# Patient Record
Sex: Male | Born: 1955 | Race: White | Hispanic: No | Marital: Single | Smoking: Never smoker
Health system: Southern US, Community
[De-identification: ages and names within clinical notes are randomized; demographics above are authoritative.]

## PROBLEM LIST (undated history)

## (undated) DIAGNOSIS — M545 Low back pain, unspecified: Secondary | ICD-10-CM

## (undated) DIAGNOSIS — E785 Hyperlipidemia, unspecified: Secondary | ICD-10-CM

## (undated) DIAGNOSIS — K219 Gastro-esophageal reflux disease without esophagitis: Secondary | ICD-10-CM

## (undated) DIAGNOSIS — I10 Essential (primary) hypertension: Secondary | ICD-10-CM

## (undated) DIAGNOSIS — D649 Anemia, unspecified: Secondary | ICD-10-CM

## (undated) DIAGNOSIS — C801 Malignant (primary) neoplasm, unspecified: Secondary | ICD-10-CM

## (undated) HISTORY — DX: Gastro-esophageal reflux disease without esophagitis: K21.9

## (undated) HISTORY — DX: Low back pain, unspecified: M54.50

## (undated) HISTORY — DX: Low back pain: M54.5

## (undated) HISTORY — PX: BACK SURGERY: SHX140

## (undated) HISTORY — DX: Hyperlipidemia, unspecified: E78.5

---

## 2007-12-01 ENCOUNTER — Ambulatory Visit (HOSPITAL_COMMUNITY): Admission: RE | Admit: 2007-12-01 | Discharge: 2007-12-02 | Payer: Self-pay | Admitting: Neurosurgery

## 2008-01-18 ENCOUNTER — Emergency Department (HOSPITAL_COMMUNITY): Admission: EM | Admit: 2008-01-18 | Discharge: 2008-01-19 | Payer: Self-pay | Admitting: Emergency Medicine

## 2009-05-23 ENCOUNTER — Emergency Department (HOSPITAL_COMMUNITY): Admission: EM | Admit: 2009-05-23 | Discharge: 2009-05-23 | Payer: Self-pay | Admitting: Emergency Medicine

## 2010-10-23 NOTE — Op Note (Signed)
Jack Gibbs, Jack Gibbs            ACCOUNT NO.:  [id number]   MEDICAL RECORD NO.:  [id number]          PATIENT TYPE:  OIB   LOCATION:  3537                         FACILITY:  [facility]   PHYSICIAN:  Danae Orleans. Venetia Maxon, M.D.  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   PROCEDURE:  Right L5-S1 microdiskectomy with microdissection.   SURGEON:  Danae Orleans. Venetia Maxon, MD.   ASSISTANT:  Coletta Memos, MD.   ANESTHESIA:  General endotracheal anesthesia.   ESTIMATED BLOOD LOSS:  Minimal.   COMPLICATIONS:  None.   DISPOSITION:  Recovery.   INDICATIONS:  Jack Gibbs is a 55 year old man with a severe right  leg pain and weakness in the L5-S1 distribution with a free fragment  herniated disk material which was directly compressing the S1 nerve root  fairly low in the foramen.  It was elected to take him for surgery for  microdiskectomy.   PROCEDURE:  Jack Gibbs was brought to the operating room.  Following  satisfactory and uncomplicated induction of general endotracheal  anesthesia plus intravenous lines, the patient was placed in a prone  position on the Wilson frame.  His low back was prepped and draped in  usual sterile fashion.  An incision was infiltrated with 0.25% Marcaine  and 0.5% lidocaine with 1:100,000 epinephrine.  Incision was made and  carried through to the lumbodorsal fascia.  It was incised sharply on  the right side of midline.  Subperiosteal dissection was performed  exposing the L5-S1 interspace.  Marker probes as well as plate was felt  to be S1-S2 and also at L5-S1 and the orientation was in fact confirmed.  Subsequently,  a hemisemilaminectomy of L5 was performed with high-speed  drill and completed with Kerrison rongeurs.  A generous foraminotomy  overlying the S1 nerve root was also performed.  The ligamentum flavum  was then detached and removed in piecemeal fashion.  Microscope was  brought into field.  The lateral thecal sac was decompressed as was the  lateral recess.  The S1 nerve root was mobilized.  Just beneath the S1  nerve root, there was a thinly contained fragment of herniated disk  material.  This was removed resulting in significant decompression of  the S1 nerve root.  Both myself and Dr. Franky Macho then palpated the floor  of the canal.  Small additional residual fragments of disk material were  removed.  The nerve root appeared to be well decompressed.  There did  appear to be bulging of the annulus, but it was not felt that this was  sufficiently problematic warranted removal and the interspace itself was  not opened.  The floor of the canal was again palpated as was the course  of the S1 nerve root with a variety of lengths of ball-tip probes and  there did not appear  to be any residual compressive disk material.  Hemostasis assured with Gelfoam-soaked thrombin, and after doing so, the  wound was bathed in Depo-Medrol and fentanyl.  Microscope was brought  out of the field and lumbodorsal fascia was closed with Vicryl sutures,  subcutaneous tissues were approximated zero Vicryl interrupted inverted  sutures and skin edges were approximated interrupted 3-0 Vicryl  subcuticular stitch.  The wound was dressed and Dermabond.  The patient  was extubated in the operating room and taken to the recovery room in  stable and satisfactory condition.  He tolerated the operation well.  Counts correct at the end of the case.      Danae Orleans. Venetia Maxon, M.D.  Electronically Signed     JDS/MEDQ  D:  12/01/2007  T:  12/02/2007  Job:  [id number]

## 2011-03-07 LAB — CBC
HCT: 43.7
Hemoglobin: 15.5
MCHC: 35.5
RBC: 5.01

## 2014-08-07 ENCOUNTER — Encounter (HOSPITAL_COMMUNITY): Payer: Self-pay | Admitting: Emergency Medicine

## 2014-08-07 ENCOUNTER — Emergency Department (HOSPITAL_COMMUNITY)
Admission: EM | Admit: 2014-08-07 | Discharge: 2014-08-07 | Disposition: A | Discharge status: Home / Self Care | Payer: Medicare Other | Attending: Emergency Medicine | Admitting: Emergency Medicine

## 2014-08-07 DIAGNOSIS — L309 Dermatitis, unspecified: Secondary | ICD-10-CM | POA: Diagnosis not present

## 2014-08-07 DIAGNOSIS — R21 Rash and other nonspecific skin eruption: Secondary | ICD-10-CM | POA: Diagnosis present

## 2014-08-07 MED ORDER — HYDROXYZINE HCL 25 MG PO TABS: 25.0000 mg | ORAL_TABLET | Freq: Four times a day (QID) | ORAL | Status: DC

## 2014-08-07 MED ORDER — DEXAMETHASONE SODIUM PHOSPHATE 10 MG/ML IJ SOLN
INTRAMUSCULAR | Status: AC
  Administered 2014-08-07: 10 mg via INTRAMUSCULAR
  Filled 2014-08-07: qty 1

## 2014-08-07 MED ORDER — DEXAMETHASONE SODIUM PHOSPHATE 4 MG/ML IJ SOLN: 10.0000 mg | Freq: Once | INTRAMUSCULAR | Status: AC

## 2014-08-07 MED ORDER — PREDNISONE 50 MG PO TABS: 50.0000 mg | ORAL_TABLET | Freq: Every day | ORAL | Status: DC

## 2014-08-07 NOTE — ED Provider Notes (Signed)
CSN: [id number]     Arrival date & time 08/07/14  1227 History  This chart was scribed for non-physician practitioner, [name], PA-C, working with [name], MD, by [name], [name]. This patient was seen in room APFT21/APFT21 and the patient's care was started at 1:57 PM.     Chief Complaint  Patient presents with  . Allergic Reaction     Patient is a 59 y.o. male presenting with allergic reaction. The history is provided by the patient. No language interpreter was used.  Allergic Reaction Presenting symptoms: rash   Presenting symptoms: no difficulty swallowing and no wheezing    HPI Comments: Jack Gibbs is a 59 y.o. male who presents to the Emergency Department complaining of burning rash to bilateral anterior forearms and redness to face. Pt states he was working in his basement at onset and is concerned he inhaled a chemical or mold. Pt notes similar reactions during moist and humid weather when inside upper level of home. Pt notes associated itching and a "coated tongue."  Pt was seen by Dr. Everette Rank for similar symptoms on 08/05/14. Pt was given a Rx for 4mg  methylprednisolone with no relief. Pt has also been taking Benedryl. Pt is not a smoker and does not chew tobacco. Pt denies facial swelling, difficulty swallowing or breathing or chest pain.  History reviewed. No pertinent past medical history. Past Surgical History  Procedure Laterality Date  . Back surgery     Family History  Problem Relation Age of Onset  . Stroke Mother    History  Substance Use Topics  . Smoking status: Never Smoker   . Smokeless tobacco: Former Systems developer  . Alcohol Use: No    Review of Systems  Constitutional: Negative for fever, chills, activity change and appetite change.  HENT: Negative for ear pain, facial swelling, sore throat and trouble swallowing.   Respiratory: Negative for chest tightness, shortness of breath and wheezing.   Gastrointestinal: Negative for  nausea and vomiting.  Musculoskeletal: Negative for neck pain and neck stiffness.  Skin: Positive for rash. Negative for wound.  Neurological: Negative for dizziness, weakness, numbness and headaches.  All other systems reviewed and are negative.     Allergies  Review of patient's allergies indicates no known allergies.  Home Medications   Prior to Admission medications   Not on File   BP 151/89 mmHg  Pulse 72  Temp(Src) 97.8 F (36.6 C) (Axillary)  Resp 20  Ht 5\' 11"  (1.803 m)  Wt 150 lb (68.04 kg)  BMI 20.93 kg/m2  SpO2 100% Physical Exam  Constitutional: He is oriented to person, place, and time. He appears well-developed and well-nourished. No distress.  HENT:  Head: Normocephalic.  Pt has slight brownish discoloration to the tongue, no lesions.  Eyes: Conjunctivae are normal.  Neck: Normal range of motion. Neck supple.  Cardiovascular: Normal rate, regular rhythm, normal heart sounds and intact distal pulses.   No murmur heard. Pulmonary/Chest: Effort normal. No respiratory distress. He exhibits no tenderness.  Musculoskeletal: Normal range of motion. He exhibits no edema or tenderness.  Lymphadenopathy:    He has no cervical adenopathy.  Neurological: He is alert and oriented to person, place, and time. Coordination normal.  Skin: Skin is warm and dry. Rash noted. Rash is maculopapular. There is erythema (mild).  confluent maculopapular rash to both forearms; no edema; mild erythema present.   Psychiatric: He has a normal mood and affect. His behavior is normal.  Nursing note and  vitals reviewed.   ED Course  Procedures (including critical care time) DIAGNOSTIC STUDIES: Oxygen Saturation is 100% on room air, normal by my interpretation.    COORDINATION OF CARE: 2:08 PM Discussed treatment plan with patient at beside, the patient agrees with the plan and has no further questions at this time.   Labs Review Labs Reviewed - No data to display  Imaging  Review No results found.   EKG Interpretation None      MDM   Final diagnoses:  Dermatitis   Pt is well appearing, non-toxic.  Vitals stable, no concerning sx's for angioedema or anaphylaxis. Will change pt to prednisone and vistaril.  He agrees to close f/u with his PMD or with dermatology.   I personally performed the services described in this documentation, which was scribed in my presence. The recorded information has been reviewed and is accurate.     [name] L. [name], PA-C 08/09/14 2224  [name], MD 08/11/14 [phone]

## 2014-08-07 NOTE — ED Notes (Signed)
Pt reports allergic reaction. States he feels "shakey," has coated tongue. Pt seen by Dr. Everette Rank on 2/26

## 2014-08-07 NOTE — Discharge Instructions (Signed)
Rash A rash is a change in the color or feel of your skin. There are many different types of rashes. You may have other problems along with your rash. HOME CARE  Avoid the thing that caused your rash.  Do not scratch your rash.  You may take cools baths to help stop itching.  Only take medicines as told by your doctor.  Keep all doctor visits as told. GET HELP RIGHT AWAY IF:   Your pain, puffiness (swelling), or redness gets worse.  You have a fever.  You have new or severe problems.  You have body aches, watery poop (diarrhea), or you throw up (vomit).  Your rash is not better after 3 days. MAKE SURE YOU:   Understand these instructions.  Will watch your condition.  Will get help right away if you are not doing well or get worse. Document Released: 11/13/2007 Document Revised: 08/19/2011 Document Reviewed: 03/11/2011 Exit[employer] Patient Information 2015 [employer]. This information is not intended to replace advice given to you by your health care provider. Make sure you discuss any questions you have with your health care provider.  

## 2014-08-10 ENCOUNTER — Encounter (HOSPITAL_COMMUNITY): Payer: Self-pay | Admitting: Emergency Medicine

## 2014-08-10 ENCOUNTER — Emergency Department (HOSPITAL_COMMUNITY)
Admission: EM | Admit: 2014-08-10 | Discharge: 2014-08-10 | Disposition: A | Discharge status: Home / Self Care | Payer: Medicare Other | Attending: Emergency Medicine | Admitting: Emergency Medicine

## 2014-08-10 DIAGNOSIS — L539 Erythematous condition, unspecified: Secondary | ICD-10-CM | POA: Insufficient documentation

## 2014-08-10 DIAGNOSIS — R21 Rash and other nonspecific skin eruption: Secondary | ICD-10-CM | POA: Diagnosis present

## 2014-08-10 DIAGNOSIS — Z79899 Other long term (current) drug therapy: Secondary | ICD-10-CM | POA: Diagnosis not present

## 2014-08-10 DIAGNOSIS — Z7952 Long term (current) use of systemic steroids: Secondary | ICD-10-CM | POA: Diagnosis not present

## 2014-08-10 NOTE — ED Notes (Signed)
Placed patient in hospital gown. Patient's family member outside of room. Patient stated "I don't want them back in here, they can wait out there." Family sent to waiting room.

## 2014-08-10 NOTE — ED Notes (Signed)
Patient's family came to nursing station and requested to see patient. Family member stated "I'm going in there whether he likes it or not." Patient states he does not want family in room. Advised family they would have to wait out in the waiting room.

## 2014-08-10 NOTE — ED Provider Notes (Signed)
CSN: [id number]     Arrival date & time 08/10/14  1102 History   First MD Initiated Contact with Patient 08/10/14 1225     Chief Complaint  Patient presents with  . Rash     (Consider location/radiation/quality/duration/timing/severity/associated sxs/prior Treatment) HPI Comments: Seen by PCP 2 weeks ago for the same rash. Seen in ED 2/28 for same rash.  Pt concerned about inhaling a chemical because of paint in the back yard.  Patient is a 59 y.o. male presenting with rash. The history is provided by the patient.  Rash Location:  Face, shoulder/arm and ano-genital Facial rash location:  Face Shoulder/arm rash location:  L arm and R arm Ano-genital rash location:  Groin Quality: burning, itchiness and redness   Quality: not blistering, not draining, not painful, not peeling, not swelling and not weeping   Severity:  Moderate Onset quality:  Gradual Duration:  2 weeks Timing:  Intermittent Progression:  Worsening Chronicity:  New Context: not food, not new detergent/soap, not nuts and not sick contacts   Context comment:  Possible mold exposure. Possible chemical exposure.  Relieved by:  Nothing Ineffective treatments: oral steroids. Associated symptoms: no abdominal pain, no joint pain, no shortness of breath and not wheezing     History reviewed. No pertinent past medical history. Past Surgical History  Procedure Laterality Date  . Back surgery     Family History  Problem Relation Age of Onset  . Stroke Mother    History  Substance Use Topics  . Smoking status: Never Smoker   . Smokeless tobacco: Former Systems developer  . Alcohol Use: No    Review of Systems  Constitutional: Negative for activity change.       All ROS Neg except as noted in HPI  HENT: Negative for nosebleeds.   Eyes: Negative for photophobia and discharge.  Respiratory: Negative for cough, shortness of breath and wheezing.   Cardiovascular: Negative for chest pain and palpitations.  Gastrointestinal:  Negative for abdominal pain and blood in stool.  Genitourinary: Negative for dysuria, frequency and hematuria.  Musculoskeletal: Negative for back pain, arthralgias and neck pain.  Skin: Positive for rash.  Neurological: Negative for dizziness, seizures and speech difficulty.  Psychiatric/Behavioral: Negative for hallucinations and confusion.      Allergies  Review of patient's allergies indicates no known allergies.  Home Medications   Prior to Admission medications   Medication Sig Start Date End Date Taking? Authorizing Provider  diphenhydrAMINE (BENADRYL) 25 MG tablet Take 25 mg by mouth every 6 (six) hours as needed for itching.   Yes Historical Provider, MD  hydrOXYzine (ATARAX/VISTARIL) 25 MG tablet Take 1 tablet (25 mg total) by mouth every 6 (six) hours. 08/07/14  Yes Tammy L. Triplett, PA-C  methylPREDNIsolone (MEDROL DOSPACK) 4 MG tablet Take 1 tablet by mouth daily. 08/05/14  Yes Historical Provider, MD  predniSONE (DELTASONE) 50 MG tablet Take 1 tablet (50 mg total) by mouth daily. For 5 days 08/07/14  Yes Tammy L. Triplett, PA-C   BP 169/86 mmHg  Pulse 71  Temp(Src) 97.7 F (36.5 C) (Oral)  Resp 18  Ht 5\' 10"  (1.778 m)  Wt 155 lb (70.308 kg)  BMI 22.24 kg/m2  SpO2 100% Physical Exam  Constitutional: He is oriented to person, place, and time. He appears well-developed and well-nourished.  Non-toxic appearance.  HENT:  Head: Normocephalic.  Right Ear: Tympanic membrane and external ear normal.  Left Ear: Tympanic membrane and external ear normal.  Eyes: EOM and lids are normal.  Pupils are equal, round, and reactive to light.  Neck: Normal range of motion. Neck supple. Carotid bruit is not present.  Cardiovascular: Normal rate, regular rhythm, normal heart sounds, intact distal pulses and normal pulses.   Pulmonary/Chest: Breath sounds normal. No respiratory distress.  Abdominal: Soft. Bowel sounds are normal. There is no tenderness. There is no guarding.   Musculoskeletal: Normal range of motion.  Lymphadenopathy:       Head (right side): No submandibular adenopathy present.       Head (left side): No submandibular adenopathy present.    He has no cervical adenopathy.  Neurological: He is alert and oriented to person, place, and time. He has normal strength. No cranial nerve deficit or sensory deficit.  Skin: Skin is warm and dry. Rash noted. No abrasion, no bruising, no burn and no petechiae noted. Rash is maculopapular. There is erythema.  Increase redness about the face and arms.  And mild redness and dryness with mild peeling about the groin.  No hives, no petechiae noted. No rash in the palms.  Psychiatric: He has a normal mood and affect. His speech is normal.  Nursing note and vitals reviewed.   ED Course  Procedures (including critical care time) Labs Review Labs Reviewed - No data to display  Imaging Review No results found.   EKG Interpretation None      MDM  Pt has been on steroids and benadryl and states they are doing little for his rash. Suggested pt see [place] for evaluation. Reassured pt that lungs were clear, and pulse ox with within normal limits and he was speaking in complete sentences. He is concerned he may be exposed to mold or chemical from paint.   Final diagnoses:  Rash    **I have reviewed nursing notes, vital signs, and all appropriate lab and imaging results for this patient.Lenox Ahr, PA-C 08/10/14 [place], MD 08/11/14 [phone]

## 2014-08-10 NOTE — ED Notes (Signed)
Pt seen here Sunday for symptoms he thinks are related to mold in his house. Pt c/o rash and burning in his groin. NAD noted.

## 2014-08-10 NOTE — Discharge Instructions (Signed)
Your vital signs are well within normal limits. Please see the dermatology specialist listed above for an office appointment as soon as possible.

## 2016-03-22 ENCOUNTER — Encounter: Payer: Self-pay | Admitting: Internal Medicine

## 2016-05-22 ENCOUNTER — Telehealth: Payer: Self-pay

## 2016-05-22 NOTE — Telephone Encounter (Signed)
Pt received a letter from [facility] to be triaged, Please call [phone]

## 2016-05-23 NOTE — Telephone Encounter (Signed)
Pt said he does not want to the the colonoscopy at this time. He will call when he is ready. Faxing a note to Dr. Maudie Mercury.

## 2016-05-30 ENCOUNTER — Other Ambulatory Visit (HOSPITAL_COMMUNITY): Payer: Self-pay | Admitting: Internal Medicine

## 2016-05-30 DIAGNOSIS — R430 Anosmia: Secondary | ICD-10-CM

## 2016-05-30 DIAGNOSIS — Z87828 Personal history of other (healed) physical injury and trauma: Secondary | ICD-10-CM

## 2016-05-30 DIAGNOSIS — L570 Actinic keratosis: Secondary | ICD-10-CM

## 2016-06-05 ENCOUNTER — Ambulatory Visit (HOSPITAL_COMMUNITY): Payer: Medicare Other

## 2017-07-08 ENCOUNTER — Encounter (HOSPITAL_COMMUNITY)
Admission: RE | Admit: 2017-07-08 | Discharge: 2017-07-08 | Disposition: A | Discharge status: Home / Self Care | Payer: Medicare HMO | Source: Ambulatory Visit | Attending: Family Medicine | Admitting: Family Medicine

## 2017-07-08 DIAGNOSIS — D649 Anemia, unspecified: Secondary | ICD-10-CM | POA: Insufficient documentation

## 2017-07-08 LAB — HEMOGLOBIN AND HEMATOCRIT, BLOOD
HEMATOCRIT: 24.8 % — AB (ref 39.0–52.0)
Hemoglobin: 6.6 g/dL — CL (ref 13.0–17.0)

## 2017-07-08 NOTE — Progress Notes (Signed)
Lab called critical result for hemoglobin of 6.6. Doctor aware of  Low hemoglobin and patient is to be transfused 2 units of PRBC's tomorrow.

## 2017-07-09 ENCOUNTER — Encounter (HOSPITAL_COMMUNITY)
Admission: RE | Admit: 2017-07-09 | Discharge: 2017-07-09 | Disposition: A | Discharge status: Home / Self Care | Payer: Medicare HMO | Source: Ambulatory Visit | Attending: Family Medicine | Admitting: Family Medicine

## 2017-07-09 DIAGNOSIS — D649 Anemia, unspecified: Secondary | ICD-10-CM | POA: Diagnosis not present

## 2017-07-09 LAB — ABO/RH: ABO/RH(D): O NEG

## 2017-07-09 LAB — PREPARE RBC (CROSSMATCH)

## 2017-07-09 MED ORDER — SODIUM CHLORIDE 0.9 % IV SOLN: Freq: Once | INTRAVENOUS | Status: DC

## 2017-07-10 LAB — BPAM RBC
BLOOD PRODUCT EXPIRATION DATE: 201902142359
Blood Product Expiration Date: 201902152359
ISSUE DATE / TIME: 201901300749
ISSUE DATE / TIME: 201901300926
UNIT TYPE AND RH: 9500
Unit Type and Rh: 9500

## 2017-07-10 LAB — TYPE AND SCREEN
ABO/RH(D): O NEG
Antibody Screen: NEGATIVE
Unit division: 0
Unit division: 0

## 2017-07-25 ENCOUNTER — Encounter: Payer: Self-pay | Admitting: Internal Medicine

## 2017-09-11 ENCOUNTER — Telehealth: Payer: Self-pay | Admitting: *Deleted

## 2017-09-11 ENCOUNTER — Ambulatory Visit: Payer: Medicare HMO | Admitting: Gastroenterology

## 2017-09-11 ENCOUNTER — Other Ambulatory Visit: Payer: Self-pay

## 2017-09-11 ENCOUNTER — Encounter: Payer: Self-pay | Admitting: Gastroenterology

## 2017-09-11 VITALS — BP 144/74 | HR 75 | Temp 97.7°F | Ht 71.0 in | Wt 163.0 lb

## 2017-09-11 DIAGNOSIS — D509 Iron deficiency anemia, unspecified: Secondary | ICD-10-CM | POA: Diagnosis not present

## 2017-09-11 DIAGNOSIS — K219 Gastro-esophageal reflux disease without esophagitis: Secondary | ICD-10-CM

## 2017-09-11 MED ORDER — PEG 3350-KCL-NA BICARB-NACL 420 G PO SOLR: 4000.0000 mL | ORAL | 0 refills | Status: DC

## 2017-09-11 NOTE — Progress Notes (Signed)
Primary Care Physician:  Jani Gravel, MD  Primary Gastroenterologist:  Garfield Cornea, MD   Chief Complaint  Patient presents with  . Colonoscopy    consult, low hgb-had blood transfusion    HPI:  Jack Gibbs is a 62 y.o. male here the request of Dr. Maudie Mercury for further evaluation of anemia.  In January patient had routine labs which revealed hemoglobin of 6.3, hematocrit 22.6, MCV 61.  Patient went to see his doctor for complaints of dyspnea on exertion which was new.  Typically he walks about 2 miles daily and he had noted increasing difficulty breathing during his walks.  Denies any melena, rectal bleeding, constipation, diarrhea, abdominal pain.  He was having new onset heartburn type symptoms associated with a couple episodes of vomiting.  He modified his diet, taking away spicy/greasy foods and he is feeling better from that standpoint.  Takes omeprazole as needed.  Recently started iron but this is making him feel bad so he takes only every other day.  Causes nausea.  He takes Advil off and on but not daily.  He has been using it for years.  No prior colonoscopy.  Back in February 6 he had follow-up hemoglobin of 8.6.  This week his iron was 16, iron saturation 6%, ferritin 27 low, TIBC 285.  Notably his ferritin was 9 back in February.  Sed rate 3.  Platelet count normal.  LFTs unremarkable.  Looking back through his records he had mild microcytic anemia in October 2017, hemoglobin 11.5, MCV 76 at that time.  Current Outpatient Medications  Medication Sig Dispense Refill  . diphenhydrAMINE (BENADRYL) 25 MG tablet Take 25 mg by mouth every 6 (six) hours as needed for itching.    Marland Kitchen ibuprofen (ADVIL) 200 MG tablet Take 200 mg by mouth every 6 (six) hours as needed.    Marland Kitchen omeprazole (PRILOSEC) 20 MG capsule Take 20 mg by mouth daily as needed.     No current facility-administered medications for this visit.     Allergies as of 09/11/2017  . (No Known Allergies)    Past Medical  History:  Diagnosis Date  . GERD (gastroesophageal reflux disease)   . Hyperlipidemia   . Lower back pain     Past Surgical History:  Procedure Laterality Date  . BACK SURGERY      Family History  Problem Relation Age of Onset  . Stroke Mother   . Colon cancer Neg Hx   . Ulcers Neg Hx     Social History   Socioeconomic History  . Marital status: Single    Spouse name: Not on file  . Number of children: Not on file  . Years of education: Not on file  . Highest education level: Not on file  Occupational History  . Not on file  Social Needs  . Financial resource strain: Not on file  . Food insecurity:    Worry: Not on file    Inability: Not on file  . Transportation needs:    Medical: Not on file    Non-medical: Not on file  Tobacco Use  . Smoking status: Never Smoker  . Smokeless tobacco: Former Network engineer and Sexual Activity  . Alcohol use: No  . Drug use: No  . Sexual activity: Not on file  Lifestyle  . Physical activity:    Days per week: Not on file    Minutes per session: Not on file  . Stress: Not on file  Relationships  . Social  connections:    Talks on phone: Not on file    Gets together: Not on file    Attends religious service: Not on file    Active member of club or organization: Not on file    Attends meetings of clubs or organizations: Not on file    Relationship status: Not on file  . Intimate partner violence:    Fear of current or ex partner: Not on file    Emotionally abused: Not on file    Physically abused: Not on file    Forced sexual activity: Not on file  Other Topics Concern  . Not on file  Social History Narrative  . Not on file      ROS:  General: Negative for anorexia, weight loss, fever, chills, fatigue, weakness. Eyes: Negative for vision changes.  ENT: Negative for hoarseness, difficulty swallowing , nasal congestion. CV: Negative for chest pain, angina, palpitations, dyspnea on exertion, peripheral edema.   Respiratory: Negative for dyspnea at rest, dyspnea on exertion, cough, sputum, wheezing.  GI: See history of present illness. GU:  Negative for dysuria, hematuria, urinary incontinence, urinary frequency, nocturnal urination.  MS: Negative for joint pain, low back pain.  Derm: Negative for rash or itching.  Neuro: Negative for weakness, abnormal sensation, seizure, frequent headaches, memory loss, confusion.  Psych: Negative for anxiety, depression, suicidal ideation, hallucinations.  Endo: Negative for unusual weight change.  Heme: Negative for bruising or bleeding. Allergy: Negative for rash or hives.    Physical Examination:  BP (!) 144/74   Pulse 75   Temp 97.7 F (36.5 C) (Oral)   Ht 5\' 11"  (1.803 m)   Wt 163 lb (73.9 kg)   BMI 22.73 kg/m    General: Well-nourished, well-developed in no acute distress.  Head: Normocephalic, atraumatic.   Eyes: Conjunctiva pink, no icterus. Mouth: Oropharyngeal mucosa moist and pink , no lesions erythema or exudate. Neck: Supple without thyromegaly, masses, or lymphadenopathy.  Lungs: Clear to auscultation bilaterally.  Heart: Regular rate and rhythm, no murmurs rubs or gallops.  Abdomen: Bowel sounds are normal, nontender, nondistended, no hepatosplenomegaly or masses, no abdominal bruits or    hernia , no rebound or guarding.   Rectal: not performed Extremities: No lower extremity edema. No clubbing or deformities.  Neuro: Alert and oriented x 4 , grossly normal neurologically.  Skin: Warm and dry, no rash or jaundice.   Psych: Alert and cooperative, normal mood and affect.  Labs: Labs from July 02, 2017.  Hemoglobin 6.3, hematocrit 22.6, MCV 61, platelets 311,000, white blood cell count 6700, BUN 9, creatinine 0.87, glucose 108, sodium 141, potassium 4.2, total bilirubin 0.3, alkaline phosphatase 88, AST 12, ALT 11, albumin 4.3  Imaging Studies: No results found.

## 2017-09-11 NOTE — Patient Instructions (Signed)
1. Colonoscopy and possible upper endoscopy as scheduled. See separate instructions.  

## 2017-09-11 NOTE — Telephone Encounter (Signed)
I had already looked on [facility] website and the only thing there was an iron/tibc/ferritin. I did not see the H/H. We looked twice.   Wonder if they did a finger prick in the PCP office. Can you check with PCP.

## 2017-09-11 NOTE — Telephone Encounter (Signed)
Patient called and stated he had blood work done at Dr. Otho Bellows office yesterday. He said he said a hemoglobin was drawn.  He wants to know if he still needs the blood work ordered for today. Forwarding to [place].

## 2017-09-11 NOTE — Telephone Encounter (Signed)
Called PCP and was advised CBC w/ diff/platelet/iron/TIBC/FERRITIN was done on 09/10/17. They will fax these over.

## 2017-09-11 NOTE — Assessment & Plan Note (Signed)
62 year old gentleman found to have profound iron deficiency anemia in January requiring blood transfusion.  Last hemoglobin in early February in the 8 range, posttransfusion.  Denies overt GI bleeding.  No prior colonoscopy.  Other symptoms include recent onset GERD associated with occasional vomiting which has improved with dietary changes.  Plan on colonoscopy with possible upper endoscopy in the near future.  I have discussed the risks, alternatives, benefits with regards to but not limited to the risk of reaction to medication, bleeding, infection, perforation and the patient is agreeable to proceed. Written consent to be obtained.  We will update hemoglobin.

## 2017-09-12 NOTE — Progress Notes (Signed)
cc'ed to pcp °

## 2017-09-15 NOTE — Telephone Encounter (Signed)
Received a copy, difficult to read but could make out values. Not a scannable copy however.   Please let patient know that he does not need our CBC given labs already done at PCP.

## 2017-09-15 NOTE — Progress Notes (Signed)
Labs dated 09/11/2017 from PCP.  Hemoglobin 10.7, hematocrit 35.  Iron/TIBC/ferritin as previously outlined

## 2017-09-15 NOTE — Telephone Encounter (Signed)
LMOVM

## 2017-09-16 NOTE — Telephone Encounter (Signed)
Patient aware.

## 2017-10-16 ENCOUNTER — Telehealth: Payer: Self-pay | Admitting: *Deleted

## 2017-10-16 NOTE — Telephone Encounter (Signed)
Spoke with patient. His procedure time for 10/17/17 has been moved to 8:30am, arrival time 7:30am. Patient aware he is too drink his 2nd half of prep at 3:30am, nothing after 5:30am. He verbalized understanding. Called made [facility] aware.

## 2017-10-17 ENCOUNTER — Other Ambulatory Visit: Payer: Self-pay

## 2017-10-17 ENCOUNTER — Telehealth: Payer: Self-pay | Admitting: *Deleted

## 2017-10-17 ENCOUNTER — Encounter (HOSPITAL_COMMUNITY): Payer: Self-pay | Admitting: *Deleted

## 2017-10-17 ENCOUNTER — Ambulatory Visit (HOSPITAL_COMMUNITY)
Admission: RE | Admit: 2017-10-17 | Discharge: 2017-10-17 | Disposition: A | Discharge status: Home / Self Care | Payer: Medicare HMO | Source: Ambulatory Visit | Attending: Internal Medicine | Admitting: Internal Medicine

## 2017-10-17 ENCOUNTER — Encounter (HOSPITAL_COMMUNITY)
Admission: RE | Disposition: A | Discharge status: Home / Self Care | Payer: Self-pay | Source: Ambulatory Visit | Attending: Internal Medicine

## 2017-10-17 DIAGNOSIS — K219 Gastro-esophageal reflux disease without esophagitis: Secondary | ICD-10-CM | POA: Insufficient documentation

## 2017-10-17 DIAGNOSIS — D509 Iron deficiency anemia, unspecified: Secondary | ICD-10-CM | POA: Insufficient documentation

## 2017-10-17 DIAGNOSIS — Z800 Family history of malignant neoplasm of digestive organs: Secondary | ICD-10-CM | POA: Diagnosis not present

## 2017-10-17 DIAGNOSIS — K6389 Other specified diseases of intestine: Secondary | ICD-10-CM

## 2017-10-17 DIAGNOSIS — Z79899 Other long term (current) drug therapy: Secondary | ICD-10-CM | POA: Insufficient documentation

## 2017-10-17 DIAGNOSIS — Z538 Procedure and treatment not carried out for other reasons: Secondary | ICD-10-CM

## 2017-10-17 DIAGNOSIS — E785 Hyperlipidemia, unspecified: Secondary | ICD-10-CM | POA: Insufficient documentation

## 2017-10-17 DIAGNOSIS — Z87891 Personal history of nicotine dependence: Secondary | ICD-10-CM | POA: Diagnosis not present

## 2017-10-17 DIAGNOSIS — K5730 Diverticulosis of large intestine without perforation or abscess without bleeding: Secondary | ICD-10-CM | POA: Diagnosis not present

## 2017-10-17 DIAGNOSIS — C189 Malignant neoplasm of colon, unspecified: Secondary | ICD-10-CM

## 2017-10-17 DIAGNOSIS — C182 Malignant neoplasm of ascending colon: Secondary | ICD-10-CM | POA: Insufficient documentation

## 2017-10-17 HISTORY — PX: BIOPSY: SHX5522

## 2017-10-17 HISTORY — PX: COLONOSCOPY: SHX5424

## 2017-10-17 HISTORY — DX: Anemia, unspecified: D64.9

## 2017-10-17 LAB — COMPREHENSIVE METABOLIC PANEL
ALBUMIN: 3.3 g/dL — AB (ref 3.5–5.0)
ALK PHOS: 88 U/L (ref 38–126)
ALT: 11 U/L — ABNORMAL LOW (ref 17–63)
AST: 12 U/L — AB (ref 15–41)
Anion gap: 7 (ref 5–15)
BILIRUBIN TOTAL: 0.7 mg/dL (ref 0.3–1.2)
BUN: 6 mg/dL (ref 6–20)
CO2: 24 mmol/L (ref 22–32)
Calcium: 8.3 mg/dL — ABNORMAL LOW (ref 8.9–10.3)
Chloride: 103 mmol/L (ref 101–111)
Creatinine, Ser: 0.75 mg/dL (ref 0.61–1.24)
GFR calc Af Amer: >60 mL/min (ref >60)
GFR calc non Af Amer: >60 mL/min (ref >60)
GLUCOSE: 94 mg/dL (ref 65–99)
POTASSIUM: 3.8 mmol/L (ref 3.5–5.1)
Sodium: 134 mmol/L — ABNORMAL LOW (ref 135–145)
TOTAL PROTEIN: 5.8 g/dL — AB (ref 6.5–8.1)

## 2017-10-17 LAB — CBC
HEMATOCRIT: 27.3 % — AB (ref 39.0–52.0)
Hemoglobin: 8.8 g/dL — ABNORMAL LOW (ref 13.0–17.0)
MCH: 24.4 pg — ABNORMAL LOW (ref 26.0–34.0)
MCHC: 32.2 g/dL (ref 30.0–36.0)
MCV: 75.8 fL — AB (ref 78.0–100.0)
Platelets: 299 10*3/uL (ref 150–400)
RBC: 3.6 MIL/uL — AB (ref 4.22–5.81)
RDW: 14.8 % (ref 11.5–15.5)
WBC: 8.7 10*3/uL (ref 4.0–10.5)

## 2017-10-17 SURGERY — COLONOSCOPY
Anesthesia: Moderate Sedation

## 2017-10-17 MED ORDER — ONDANSETRON HCL 4 MG/2ML IJ SOLN
INTRAMUSCULAR | Status: AC
  Filled 2017-10-17: qty 2

## 2017-10-17 MED ORDER — MIDAZOLAM HCL 5 MG/5ML IJ SOLN
INTRAMUSCULAR | Status: DC | PRN
  Administered 2017-10-17: 2 mg via INTRAVENOUS
  Administered 2017-10-17 (×2): 1 mg via INTRAVENOUS

## 2017-10-17 MED ORDER — SODIUM CHLORIDE 0.9 % IV SOLN
INTRAVENOUS | Status: DC
  Administered 2017-10-17: 08:00:00 via INTRAVENOUS

## 2017-10-17 MED ORDER — ONDANSETRON HCL 4 MG/2ML IJ SOLN
INTRAMUSCULAR | Status: DC | PRN
  Administered 2017-10-17: 4 mg via INTRAVENOUS

## 2017-10-17 MED ORDER — LIDOCAINE VISCOUS 2 % MT SOLN
OROMUCOSAL | Status: DC | PRN
  Administered 2017-10-17: 1 via OROMUCOSAL

## 2017-10-17 MED ORDER — LIDOCAINE VISCOUS 2 % MT SOLN
OROMUCOSAL | Status: AC
  Filled 2017-10-17: qty 15

## 2017-10-17 MED ORDER — MIDAZOLAM HCL 5 MG/5ML IJ SOLN
INTRAMUSCULAR | Status: AC
  Filled 2017-10-17: qty 10

## 2017-10-17 MED ORDER — MEPERIDINE HCL 100 MG/ML IJ SOLN
INTRAMUSCULAR | Status: AC
  Filled 2017-10-17: qty 2

## 2017-10-17 MED ORDER — MEPERIDINE HCL 100 MG/ML IJ SOLN
INTRAMUSCULAR | Status: DC | PRN
  Administered 2017-10-17: 25 mg via INTRAVENOUS
  Administered 2017-10-17: 50 mg via INTRAVENOUS
  Administered 2017-10-17: 25 mg via INTRAVENOUS

## 2017-10-17 NOTE — Telephone Encounter (Signed)
Received call from Endo stating patient needs a CT of his chest/abdomen/pelvis for colon mass. Dr. Gala Romney are these with contrast? Please advise thanks.

## 2017-10-17 NOTE — Telephone Encounter (Signed)
CT's scheduled for 11/04/17 at 1:00pm (soonest appt available), arrival time 12:45pm, npo 4 hours prior to test, pick up oral contrast from [place] with instructions.  LMOVM

## 2017-10-17 NOTE — Telephone Encounter (Signed)
Per RMR, make STAT to have done next week. Awaiting PA approval currently.

## 2017-10-17 NOTE — Telephone Encounter (Signed)
CT's with contrast

## 2017-10-17 NOTE — H&P (Signed)
@LOGO @   Primary Care Physician:  Jani Gravel, MD Primary Gastroenterologist:  Dr. Gala Romney  Pre-Procedure History & Physical: HPI:  Jack Gibbs is a 62 y.o. male here for colonoscopy and possible EGD given profound microcytic anemia. No obvious GI bleeding. No prior colonoscopy. No upper GI tract symptoms soft GERD well controlled.  Past Medical History:  Diagnosis Date  . Anemia   . GERD (gastroesophageal reflux disease)   . Hyperlipidemia   . Lower back pain     Past Surgical History:  Procedure Laterality Date  . BACK SURGERY      Prior to Admission medications   Medication Sig Start Date End Date Taking? Authorizing Provider  polyethylene glycol-electrolytes (TRILYTE) 420 g solution Take 4,000 mLs by mouth as directed. 09/11/17  Yes Zebulin Siegel, Cristopher Estimable, MD  ibuprofen (ADVIL) 200 MG tablet Take 200 mg by mouth every 6 (six) hours as needed for headache or moderate pain.     [provider]    Allergies as of 09/11/2017  . (No Known Allergies)    Family History  Problem Relation Age of Onset  . Stroke Mother   . Colon cancer Neg Hx   . Ulcers Neg Hx     Social History   Socioeconomic History  . Marital status: Single    Spouse name: Not on file  . Number of children: Not on file  . Years of education: Not on file  . Highest education level: Not on file  Occupational History  . Not on file  Social Needs  . Financial resource strain: Not on file  . Food insecurity:    Worry: Not on file    Inability: Not on file  . Transportation needs:    Medical: Not on file    Non-medical: Not on file  Tobacco Use  . Smoking status: Never Smoker  . Smokeless tobacco: Former Network engineer and Sexual Activity  . Alcohol use: No  . Drug use: No  . Sexual activity: Not on file  Lifestyle  . Physical activity:    Days per week: Not on file    Minutes per session: Not on file  . Stress: Not on file  Relationships  . Social connections:    Talks on phone:  Not on file    Gets together: Not on file    Attends religious service: Not on file    Active member of club or organization: Not on file    Attends meetings of clubs or organizations: Not on file    Relationship status: Not on file  . Intimate partner violence:    Fear of current or ex partner: Not on file    Emotionally abused: Not on file    Physically abused: Not on file    Forced sexual activity: Not on file  Other Topics Concern  . Not on file  Social History Narrative  . Not on file    Review of Systems: See HPI, otherwise negative ROS  Physical Exam: BP 124/85   Pulse 81   Temp 97.7 F (36.5 C) (Oral)   Resp 16   Ht 5\' 6"  (1.676 m)   Wt 163 lb (73.9 kg)   SpO2 100%   BMI 26.31 kg/m  General:   Alert,  Well-developed, well-nourished, pleasant and cooperative in NAD Neck:  Supple; no masses or thyromegaly. No significant cervical adenopathy. Lungs:  Clear throughout to auscultation.   No wheezes, crackles, or rhonchi. No acute distress. Heart:  Regular  rate and rhythm; no murmurs, clicks, rubs,  or gallops. Abdomen: Non-distended, normal bowel sounds.  Soft and nontender without appreciable mass or hepatosplenomegaly.  Pulses:  Normal pulses noted. Extremities:  Without clubbing or edema.  Impression:  62 year old gentleman with a microcytic anemia. No prior colonoscopy.  Recommendations:  I will offer the patient a colonoscopy with a possible EGD to follow per plan.  The risks, benefits, limitations, imponderables and alternatives regarding both EGD and colonoscopy have been reviewed with the patient. Questions have been answered. All parties agreeable.      Notice: This dictation was prepared with Dragon dictation along with smaller phrase technology. Any transcriptional errors that result from this process are unintentional and may not be corrected upon review.

## 2017-10-17 NOTE — Telephone Encounter (Signed)
PA submitted via [employer]'s website. This is pending. Service order: [id number]

## 2017-10-17 NOTE — Op Note (Signed)
[facility] Patient Name: Jack Gibbs Procedure Date: 10/17/2017 8:09 AM MRN: [id number] Date of Birth: Apr 08, 1956 Attending MD: Norvel Blasingame , MD CSN: [id number] Age: 62 Admit Type: Outpatient Procedure:                Colonoscopy Indications:              Unexplained iron deficiency anemia Providers:                Norvel Berenson, MD, Lurline Del, RN, Aram Candela Referring MD:              Medicines:                Midazolam 4 mg IV, Meperidine 125 mg IV,                            Ondansetron 4 mg IV Complications:            No immediate complications. Estimated Blood Loss:     Estimated blood loss was minimal. Estimated blood                            loss was minimal. Procedure:                Pre-Anesthesia Assessment:                           - Prior to the procedure, a History and Physical                            was performed, and patient medications and                            allergies were reviewed. The patient's tolerance of                            previous anesthesia was also reviewed. The risks                            and benefits of the procedure and the sedation                            options and risks were discussed with the patient.                            All questions were answered, and informed consent                            was obtained. Prior Anticoagulants: The patient has                            taken no previous anticoagulant or antiplatelet  agents. ASA Grade Assessment: III - A patient with                            severe systemic disease. After reviewing the risks                            and benefits, the patient was deemed in                            satisfactory condition to undergo the procedure.                           After obtaining informed consent, the colonoscope                            was passed under direct vision. Throughout  the                            procedure, the patient's blood pressure, pulse, and                            oxygen saturations were monitored continuously. The                            EC-[id number] ([id number]) scope was introduced through                            the anus with the intention of advancing to the                            cecum. The scope was advanced to the ascending                            colon before the procedure was aborted. Medications                            were given. The colonoscopy was performed without                            difficulty. The patient tolerated the procedure                            well. The quality of the bowel preparation was                            adequate. The rectum was photographed. The entire                            colon was not examined. The colonoscopy was aborted. Scope In: 8:33:28 AM Scope Out: 4:33:29 AM Total Procedure Duration: 0 hours 12 minutes 45 seconds  Findings:      The perianal and digital rectal examinations were normal.      A few medium-mouthed diverticula were found in the sigmoid colon  and       descending colon. Bulky apple core nearly obstructing lesion found in       the area proximal to the hepatic flexure by several centimeters.       Compromised the lumen. Could not advance the scope through the lesion to       confirm location. Please see photos. Multiple biopsies obtained with a       jumbo biopsy forceps.      The exam was otherwise without abnormality on direct and retroflexion       views. Impression:               - The procedure was aborted. 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                           - Diverticulosis in the sigmoid colon and in the                            descending colon.                           - The examination was  otherwise normal on direct                            and retroflexion views.                           - EGD not done. Moderate Sedation:      Moderate (conscious) sedation was administered by the endoscopy nurse       and supervised by the endoscopist. The following parameters were       monitored: oxygen saturation, heart rate, blood pressure, respiratory       rate, EKG, adequacy of pulmonary ventilation, and response to care.       Total physician intraservice time was 19 minutes. Recommendation:           - Patient has a contact number available for                            emergencies. The signs and symptoms of potential                            delayed complications were discussed with the                            patient. Return to normal activities tomorrow.                            Written discharge instructions were provided to the  patient.                           - Resume previous diet.                           - Continue present medications.                           - Await pathology results. Serum CEA, CBC, Chem-12,                            chest, abdominal pelvic CT. Anticipate surgical                            referral in the near future.                           - Repeat colonoscopy (date not yet determined) for                            surveillance.                           - Return to GI office (date not yet determined). Procedure Code(s):        --- Professional ---                           [phone], 8, Colonoscopy, flexible; diagnostic,                            including collection of specimen(s) by brushing or                            washing, when performed (separate procedure)                           G0500, Moderate sedation services provided by the                            same physician or other qualified health care                            professional performing a gastrointestinal                             endoscopic service that sedation supports,                            requiring the presence of an independent trained                            observer to assist in the monitoring of the                            patient's level of consciousness and physiological  status; initial 15 minutes of intra-service time;                            patient age 24 years or older (additional time may                            be reported with [phone], as appropriate) Diagnosis Code(s):        --- Professional ---                           Z53.8, Procedure and treatment not carried out for                            other reasons                           D50.9, Iron deficiency anemia, unspecified                           K57.30, Diverticulosis of large intestine without                            perforation or abscess without bleeding CPT copyright 2017 American Medical Association. All rights reserved. The codes documented in this report are preliminary and upon coder review may  be revised to meet current compliance requirements. Cristopher Estimable. Kayle Passarelli, MD Norvel Lybarger, MD 10/17/2017 9:03:53 AM This report has been signed electronically. Number of Addenda: 0

## 2017-10-17 NOTE — Discharge Instructions (Signed)
[facility] Discharge Instructions  Read the instructions outlined below and refer to this sheet in the next few weeks. These discharge instructions provide you with general information on caring for yourself after you leave the hospital. Your doctor may also give you specific instructions. While your treatment has been planned according to the most current medical practices available, unavoidable complications occasionally occur. If you have any problems or questions after discharge, call Dr. Gala Romney at [phone]. ACTIVITY  You may resume your regular activity, but move at a slower pace for the next 24 hours.   Take frequent rest periods for the next 24 hours.   Walking will help get rid of the air and reduce the bloated feeling in your belly (abdomen).   No driving for 24 hours (because of the medicine (anesthesia) used during the test).    Do not sign any important legal documents or operate any machinery for 24 hours (because of the anesthesia used during the test).  NUTRITION  Drink plenty of fluids.   You may resume your normal diet as instructed by your doctor.   Begin with a light meal and progress to your normal diet. Heavy or fried foods are harder to digest and may make you feel sick to your stomach (nauseated).   Avoid alcoholic beverages for 24 hours or as instructed.  MEDICATIONS  You may resume your normal medications unless your doctor tells you otherwise.  WHAT YOU CAN EXPECT TODAY  Some feelings of bloating in the abdomen.   Passage of more gas than usual.   Spotting of blood in your stool or on the toilet paper.  IF YOU HAD POLYPS REMOVED DURING THE COLONOSCOPY:  No aspirin products for 7 days or as instructed.   No alcohol for 7 days or as instructed.   Eat a soft diet for the next 24 hours.  FINDING OUT THE RESULTS OF YOUR TEST Not all test results are available during your visit. If your test results are not back during the visit, make an appointment  with your caregiver to find out the results. Do not assume everything is normal if you have not heard from your caregiver or the medical facility. It is important for you to follow up on all of your test results.  SEEK IMMEDIATE MEDICAL ATTENTION IF:  You have more than a spotting of blood in your stool.   Your belly is swollen (abdominal distention).   You are nauseated or vomiting.   You have a temperature over 101.   You have abdominal pain or discomfort that is severe or gets worse throughout the day.    Colon  Diverticulosis information provided  There is a mass in the right side of your colon which needs to be removed by operation  We'll schedule a contrast CT the chest, abdomen and pelvis to further evaluate  Serum CEA and chem 12 today on in addition to CBC  Anticipate referral to a general surgeon in the near future.   Diverticulosis Diverticulosis is a condition that develops when small pouches (diverticula) form in the wall of the large intestine (colon). The colon is where water is absorbed and stool is formed. The pouches form when the inside layer of the colon pushes through weak spots in the outer layers of the colon. You may have a few pouches or many of them. What are the causes? The cause of this condition is not known. What increases the risk? The following factors may make you more likely  to develop this condition:  Being older than age 82. Your risk for this condition increases with age. Diverticulosis is rare among people younger than age 55. By age 56, many people have it.  Eating a low-fiber diet.  Having frequent constipation.  Being overweight.  Not getting enough exercise.  Smoking.  Taking over-the-counter pain medicines, like aspirin and ibuprofen.  Having a family history of diverticulosis.  What are the signs or symptoms? In most people, there are no symptoms of this condition. If you do have symptoms, they may  include:  Bloating.  Cramps in the abdomen.  Constipation or diarrhea.  Pain in the lower left side of the abdomen.  How is this diagnosed? This condition is most often diagnosed during an exam for other colon problems. Because diverticulosis usually has no symptoms, it often cannot be diagnosed independently. This condition may be diagnosed by:  Using a flexible scope to examine the colon (colonoscopy).  Taking an X-ray of the colon after dye has been put into the colon (barium enema).  Doing a CT scan.  How is this treated? You may not need treatment for this condition if you have never developed an infection related to diverticulosis. If you have had an infection before, treatment may include:  Eating a high-fiber diet. This may include eating more fruits, vegetables, and grains.  Taking a fiber supplement.  Taking a live bacteria supplement (probiotic).  Taking medicine to relax your colon.  Taking antibiotic medicines.  Follow these instructions at home:  Drink 6-8 glasses of water or more each day to prevent constipation.  Try not to strain when you have a bowel movement.  If you have had an infection before: ? Eat more fiber as directed by your health care provider or your diet and nutrition specialist (dietitian). ? Take a fiber supplement or probiotic, if your health care provider approves.  Take over-the-counter and prescription medicines only as told by your health care provider.  If you were prescribed an antibiotic, take it as told by your health care provider. Do not stop taking the antibiotic even if you start to feel better.  Keep all follow-up visits as told by your health care provider. This is important. Contact a health care provider if:  You have pain in your abdomen.  You have bloating.  You have cramps.  You have not had a bowel movement in 3 days. Get help right away if:  Your pain gets worse.  Your bloating becomes very bad.  You  have a fever or chills, and your symptoms suddenly get worse.  You vomit.  You have bowel movements that are bloody or black.  You have bleeding from your rectum. Summary  Diverticulosis is a condition that develops when small pouches (diverticula) form in the wall of the large intestine (colon).  You may have a few pouches or many of them.  This condition is most often diagnosed during an exam for other colon problems.  If you have had an infection related to diverticulosis, treatment may include increasing the fiber in your diet, taking supplements, or taking medicines. This information is not intended to replace advice given to you by your health care provider. Make sure you discuss any questions you have with your health care provider. Document Released: 02/22/2004 Document Revised: 04/15/2016 Document Reviewed: 04/15/2016 Elsevier Interactive Patient Education  2017 [employer].

## 2017-10-18 LAB — CEA: CEA: 0.8 ng/mL (ref 0.0–4.7)

## 2017-10-20 ENCOUNTER — Other Ambulatory Visit: Payer: Self-pay | Admitting: *Deleted

## 2017-10-20 DIAGNOSIS — K6389 Other specified diseases of intestine: Secondary | ICD-10-CM

## 2017-10-20 NOTE — Telephone Encounter (Signed)
Spoke with pt and is aware of below. He is scheduled for CT's 10/27/17 at 3:30am. Patient is also aware I am working on PA through his insurance and as soon I get this approved I will call him back so we can get these scheduled for this week. He voiced understanding. Aware NPO 4 hours prior to test and will need to pick up oral contrast from [facility]

## 2017-10-20 NOTE — Telephone Encounter (Signed)
CT's scheduled for 10/21/17 arrive at 2:15pm, NPO 4 hrs prior.   LMOVM for pt.

## 2017-10-20 NOTE — Telephone Encounter (Signed)
Patient called back and is aware of appt details below. He will go and pick up oral contrast today

## 2017-10-20 NOTE — Telephone Encounter (Signed)
PA for CT chest/abd/pelvis with contrast was approved via [employer]'s webstie. Auth # [id number]. Dates 10/20/17-01/18/18.

## 2017-10-21 ENCOUNTER — Ambulatory Visit (HOSPITAL_COMMUNITY)
Admission: RE | Admit: 2017-10-21 | Discharge: 2017-10-21 | Disposition: A | Discharge status: Home / Self Care | Payer: Medicare HMO | Source: Ambulatory Visit | Attending: Internal Medicine | Admitting: Internal Medicine

## 2017-10-21 ENCOUNTER — Telehealth: Payer: Self-pay

## 2017-10-21 ENCOUNTER — Other Ambulatory Visit: Payer: Self-pay

## 2017-10-21 DIAGNOSIS — I700 Atherosclerosis of aorta: Secondary | ICD-10-CM | POA: Diagnosis not present

## 2017-10-21 DIAGNOSIS — N400 Enlarged prostate without lower urinary tract symptoms: Secondary | ICD-10-CM | POA: Insufficient documentation

## 2017-10-21 DIAGNOSIS — N289 Disorder of kidney and ureter, unspecified: Secondary | ICD-10-CM | POA: Diagnosis not present

## 2017-10-21 DIAGNOSIS — K6389 Other specified diseases of intestine: Secondary | ICD-10-CM

## 2017-10-21 DIAGNOSIS — C189 Malignant neoplasm of colon, unspecified: Secondary | ICD-10-CM

## 2017-10-21 DIAGNOSIS — K639 Disease of intestine, unspecified: Secondary | ICD-10-CM | POA: Diagnosis present

## 2017-10-21 MED ORDER — IOPAMIDOL (ISOVUE-300) INJECTION 61%
100.0000 mL | Freq: Once | INTRAVENOUS | Status: AC | PRN
  Administered 2017-10-21: 100 mL via INTRAVENOUS

## 2017-10-21 NOTE — Telephone Encounter (Signed)
done

## 2017-10-21 NOTE — Telephone Encounter (Signed)
Mary from [facility] called to ask RMR to review pts report in his chart.

## 2017-10-22 ENCOUNTER — Other Ambulatory Visit: Payer: Self-pay

## 2017-10-22 ENCOUNTER — Encounter (HOSPITAL_COMMUNITY): Payer: Self-pay | Admitting: Internal Medicine

## 2017-10-22 DIAGNOSIS — N2889 Other specified disorders of kidney and ureter: Secondary | ICD-10-CM

## 2017-10-27 ENCOUNTER — Ambulatory Visit (HOSPITAL_COMMUNITY): Payer: Medicare HMO

## 2017-10-28 ENCOUNTER — Ambulatory Visit: Payer: Medicare HMO | Admitting: General Surgery

## 2017-10-28 ENCOUNTER — Encounter: Payer: Self-pay | Admitting: General Surgery

## 2017-10-28 DIAGNOSIS — C182 Malignant neoplasm of ascending colon: Secondary | ICD-10-CM

## 2017-10-28 DIAGNOSIS — C189 Malignant neoplasm of colon, unspecified: Secondary | ICD-10-CM | POA: Insufficient documentation

## 2017-10-28 MED ORDER — METRONIDAZOLE 500 MG PO TABS: 1000.0000 mg | ORAL_TABLET | ORAL | 0 refills | Status: DC

## 2017-10-28 MED ORDER — NEOMYCIN SULFATE 500 MG PO TABS: 1000.0000 mg | ORAL_TABLET | ORAL | 0 refills | Status: DC

## 2017-10-28 NOTE — Progress Notes (Signed)
[facility] History and Physical  Reason for Referral: Ascending colon cancer  Referring Physician:  Dr. Gala Romney   Chief Complaint    Colon Cancer      Jack Gibbs is a 62 y.o. male.  HPI: Mr. Maselli is a 61 yo male who is otherwise relatively healthy who presented with a microcytic anemia of unknown origin, requiring 2u pRBC.  He ultimately underwent colonoscopy with Dr. Gala Romney and was found to have a mass in the ascending colon that was biopsied and is positive for invasive adenocarcinoma.  He is otherwise been asymptomatic. He has had some constipation since starting the iron pills for the anemia, but prior to this did not have any bloody BMs that he knew of or changes in his stool.   He does not work, and has had back surgery in the past. He has no chest pain or SOB complaints.   Past Medical History:  Diagnosis Date  . Anemia   . GERD (gastroesophageal reflux disease)   . Hyperlipidemia   . Lower back pain     Past Surgical History:  Procedure Laterality Date  . BACK SURGERY    . BIOPSY  10/17/2017   Procedure: BIOPSY;  Surgeon: Daneil Dolin, MD;  Location: [facility];  Service: Endoscopy;;  . COLONOSCOPY N/A 10/17/2017   Procedure: COLONOSCOPY;  Surgeon: Daneil Dolin, MD;  Location: [facility];  Service: Endoscopy;  Laterality: N/A;  2:00pm    Family History  Problem Relation Age of Onset  . Stroke Mother   . Colon cancer Neg Hx   . Ulcers Neg Hx     Social History   Tobacco Use  . Smoking status: Never Smoker  . Smokeless tobacco: Former Network engineer Use Topics  . Alcohol use: No  . Drug use: No    Medications: I have reviewed the patient's current medications. Ibuprofen/ PRNs only med reported other than preps for bowel  Allergies as of 10/28/2017   No Known Allergies     Medication List        Accurate as of 10/28/17  9:21 AM. Always use your most recent med list.          ADVIL 200 MG tablet Generic drug:   ibuprofen Take 200 mg by mouth every 6 (six) hours as needed for headache or moderate pain.   metroNIDAZOLE 500 MG tablet Commonly known as:  FLAGYL Take 2 tablets (1,000 mg total) by mouth as directed. Take 2 Flagyl 500mg  tablets 2 pm, 3pm, 10pm.   neomycin 500 MG tablet Commonly known as:  MYCIFRADIN Take 2 tablets (1,000 mg total) by mouth as directed. Take 2 neomycin 500mg  tablets 2 pm, 3pm, 10pm.   polyethylene glycol-electrolytes 420 g solution Commonly known as:  TRILYTE Take 4,000 mLs by mouth as directed.       ROS:  A comprehensive review of systems was negative except for: Gastrointestinal: positive for abdominal pain and constipation Musculoskeletal: positive for bone pain and stiff joints dry skin, hay fever  Blood pressure 140/70, pulse 66, temperature 98.4 F (36.9 C), temperature source Oral, resp. rate 16, weight 151 lb (68.5 kg). Physical Exam  Constitutional: He is oriented to person, place, and time. He appears well-developed and well-nourished.  HENT:  Head: Normocephalic and atraumatic.  Eyes: Pupils are equal, round, and reactive to light. EOM are normal.  Neck: Normal range of motion. Neck supple.  Cardiovascular: Normal rate and regular rhythm.  Pulmonary/Chest: Effort normal  and breath sounds normal.  Abdominal: Soft. He exhibits no distension and no mass. There is no tenderness. There is no guarding. No hernia.  Musculoskeletal: Normal range of motion. He exhibits no edema.  Neurological: He is alert and oriented to person, place, and time.  Skin: Skin is warm and dry.  Psychiatric: He has a normal mood and affect. His behavior is normal. Judgment and thought content normal.  Vitals reviewed.   Results: CT a/p/ chest- reviewed personally- area in ascending colon consistent with mass, no liver lesions, no lung lesions  IMPRESSION: 1. Annular 5.8 cm colonic mass in the ascending colon compatible with primary colonic malignancy. Associated luminal  narrowing without evidence of obstruction. 2. Nonspecific patchy fat stranding and ill-defined soft tissue density anterior to the ascending colonic mass in the right omental fat, which could represent post biopsy change, with direct tumor invasion not excluded. No free air. No discrete abscess. 3. Few borderline prominent rounded right mesenteric nodes, suspicious for locoregional nodal metastases. 4. No evidence of liver or other distant metastatic disease. 5. Indeterminate hypodense 1.5 cm renal cortical mass in the posterior interpolar right kidney, with findings suspicious for renal cell carcinoma. Further characterization with MRI (preferred) or CT abdomen without and with IV contrast recommended. 6. Mildly enlarged prostate. 7.  Aortic Atherosclerosis (ICD10-I70.0).  Colonoscopy - The procedure was aborted. Bulky apple core tumor in the vicinity of the ascending (preventing scope passage to the cecum) status post biopsy. - Diverticulosis in the sigmoid colon and in the descending colon. - The examination was otherwise normal on direct and retroflexion views. - EGD not done.  Pathology: Diagnosis Colon, biopsy, right ascending - INVASIVE ADENOCARCINOMA.  CEA 0.8   Assessment & Plan:  ZAMAURI NEZ is a 61 y.o. male with an ascending colon cancer. CEA wnl, some questionable nodes on the CT. Otherwise healthy and no prior abdominal surgeries.   - Getting MRI of the renal mass on the right kidney, getting this 5/22, will follow up, if has some renal cancer will need to get urology to see and decide if joint surgery warranted?  -Or for laparoscopic right hemicolectomy for the ascending colon cancer  -Discussed the post operative course and needing to not lift > 10 lbs for 6-8 weeks due to the incision for removing the colon, he does not work so this is not an issue   Colon prep given to the patient and Rx sent to pharmacy:  Buy from the Store: Miralax bottle (288g).    Gatorade 64 oz (not red). Dulcolax tablets.   The Day Prior to Surgery: Take 4 ducolax tablets at 7am with water. Drink plenty of clear liquids all day to avoid dehydration, no solid food.    Mix the bottle of Miralax and 64 oz of Gatorade and drink this mixture starting at 10am. Drink it gradually over the next few hours, 8 ounces every 15-30 minutes until it is gone. Finish this by 2pm.  Take 2 neomycin 500mg  tablets and 2 metronidazole 500mg  tablets at 2 pm. Take 2 neomycin 500mg  tablets and 2 metronidazole 500mg  tablets at 3pm. Take 2 neomycin 500mg  tablets and 2 metronidazole 500mg  tablets at 10pm.    Do not eat or drink anything after midnight the night before your surgery.  Do not eat or drink anything that morning, and take medications as instructed by the hospital staff on your preoperative visit.   All questions were answered to the satisfaction of the patient.  The risk and  benefits of laparoscopic right colectomy were discussed including but not limited to bleeding, infection, risk of injury to ureter or other organ, risk of needing open procedure.  After careful consideration, LAMIN CHANDLEY has decided to proceed.    [name] 10/28/2017, 9:21 AM

## 2017-10-28 NOTE — H&P (Signed)
[facility] History and Physical  Reason for Referral: Ascending colon cancer  Referring Physician:  Dr. Gala Romney   Chief Complaint    Colon Cancer      Jack Gibbs is a 62 y.o. male.  HPI: Jack Gibbs is a 62 yo male who is otherwise relatively healthy who presented with a microcytic anemia of unknown origin, requiring 2u pRBC.  He ultimately underwent colonoscopy with Dr. Gala Romney and was found to have a mass in the ascending colon that was biopsied and is positive for invasive adenocarcinoma.  He is otherwise been asymptomatic. He has had some constipation since starting the iron pills for the anemia, but prior to this did not have any bloody BMs that he knew of or changes in his stool.   He does not work, and has had back surgery in the past. He has no chest pain or SOB complaints.   Past Medical History:  Diagnosis Date  . Anemia   . GERD (gastroesophageal reflux disease)   . Hyperlipidemia   . Lower back pain     Past Surgical History:  Procedure Laterality Date  . BACK SURGERY    . BIOPSY  10/17/2017   Procedure: BIOPSY;  Surgeon: Daneil Dolin, MD;  Location: [facility];  Service: Endoscopy;;  . COLONOSCOPY N/A 10/17/2017   Procedure: COLONOSCOPY;  Surgeon: Daneil Dolin, MD;  Location: [facility];  Service: Endoscopy;  Laterality: N/A;  2:00pm    Family History  Problem Relation Age of Onset  . Stroke Mother   . Colon cancer Neg Hx   . Ulcers Neg Hx     Social History   Tobacco Use  . Smoking status: Never Smoker  . Smokeless tobacco: Former Network engineer Use Topics  . Alcohol use: No  . Drug use: No    Medications: I have reviewed the patient's current medications. Ibuprofen/ PRNs only med reported other than preps for bowel  Allergies as of 10/28/2017   No Known Allergies     Medication List        Accurate as of 10/28/17  9:21 AM. Always use your most recent med list.          ADVIL 200 MG tablet Generic drug:   ibuprofen Take 200 mg by mouth every 6 (six) hours as needed for headache or moderate pain.   metroNIDAZOLE 500 MG tablet Commonly known as:  FLAGYL Take 2 tablets (1,000 mg total) by mouth as directed. Take 2 Flagyl 500mg  tablets 2 pm, 3pm, 10pm.   neomycin 500 MG tablet Commonly known as:  MYCIFRADIN Take 2 tablets (1,000 mg total) by mouth as directed. Take 2 neomycin 500mg  tablets 2 pm, 3pm, 10pm.   polyethylene glycol-electrolytes 420 g solution Commonly known as:  TRILYTE Take 4,000 mLs by mouth as directed.       ROS:  A comprehensive review of systems was negative except for: Gastrointestinal: positive for abdominal pain and constipation Musculoskeletal: positive for bone pain and stiff joints dry skin, hay fever  Blood pressure 140/70, pulse 66, temperature 98.4 F (36.9 C), temperature source Oral, resp. rate 16, weight 151 lb (68.5 kg). Physical Exam  Constitutional: He is oriented to person, place, and time. He appears well-developed and well-nourished.  HENT:  Head: Normocephalic and atraumatic.  Eyes: Pupils are equal, round, and reactive to light. EOM are normal.  Neck: Normal range of motion. Neck supple.  Cardiovascular: Normal rate and regular rhythm.  Pulmonary/Chest: Effort normal  and breath sounds normal.  Abdominal: Soft. He exhibits no distension and no mass. There is no tenderness. There is no guarding. No hernia.  Musculoskeletal: Normal range of motion. He exhibits no edema.  Neurological: He is alert and oriented to person, place, and time.  Skin: Skin is warm and dry.  Psychiatric: He has a normal mood and affect. His behavior is normal. Judgment and thought content normal.  Vitals reviewed.   Results: CT a/p/ chest- reviewed personally- area in ascending colon consistent with mass, no liver lesions, no lung lesions  IMPRESSION: 1. Annular 5.8 cm colonic mass in the ascending colon compatible with primary colonic malignancy. Associated luminal  narrowing without evidence of obstruction. 2. Nonspecific patchy fat stranding and ill-defined soft tissue density anterior to the ascending colonic mass in the right omental fat, which could represent post biopsy change, with direct tumor invasion not excluded. No free air. No discrete abscess. 3. Few borderline prominent rounded right mesenteric nodes, suspicious for locoregional nodal metastases. 4. No evidence of liver or other distant metastatic disease. 5. Indeterminate hypodense 1.5 cm renal cortical mass in the posterior interpolar right kidney, with findings suspicious for renal cell carcinoma. Further characterization with MRI (preferred) or CT abdomen without and with IV contrast recommended. 6. Mildly enlarged prostate. 7.  Aortic Atherosclerosis (ICD10-I70.0).  Colonoscopy - The procedure was aborted. Bulky apple core tumor in the vicinity of the ascending (preventing scope passage to the cecum) status post biopsy. - Diverticulosis in the sigmoid colon and in the descending colon. - The examination was otherwise normal on direct and retroflexion views. - EGD not done.  Pathology: Diagnosis Colon, biopsy, right ascending - INVASIVE ADENOCARCINOMA.  CEA 0.8   Assessment & Plan:  Jack Gibbs is a 62 y.o. male with an ascending colon cancer. CEA wnl, some questionable nodes on the CT. Otherwise healthy and no prior abdominal surgeries.   - Getting MRI of the renal mass on the right kidney, getting this 5/22, will follow up, if has some renal cancer will need to get urology to see and decide if joint surgery warranted?  -Or for laparoscopic right hemicolectomy for the ascending colon cancer  -Discussed the post operative course and needing to not lift > 10 lbs for 6-8 weeks due to the incision for removing the colon, he does not work so this is not an issue   Colon prep given to the patient and Rx sent to pharmacy:  Buy from the Store: Miralax bottle (288g).    Gatorade 64 oz (not red). Dulcolax tablets.   The Day Prior to Surgery: Take 4 ducolax tablets at 7am with water. Drink plenty of clear liquids all day to avoid dehydration, no solid food.    Mix the bottle of Miralax and 64 oz of Gatorade and drink this mixture starting at 10am. Drink it gradually over the next few hours, 8 ounces every 15-30 minutes until it is gone. Finish this by 2pm.  Take 2 neomycin 500mg  tablets and 2 metronidazole 500mg  tablets at 2 pm. Take 2 neomycin 500mg  tablets and 2 metronidazole 500mg  tablets at 3pm. Take 2 neomycin 500mg  tablets and 2 metronidazole 500mg  tablets at 10pm.    Do not eat or drink anything after midnight the night before your surgery.  Do not eat or drink anything that morning, and take medications as instructed by the hospital staff on your preoperative visit.   All questions were answered to the satisfaction of the patient.  The risk and  benefits of laparoscopic right colectomy were discussed including but not limited to bleeding, infection, risk of injury to ureter or other organ, risk of needing open procedure.  After careful consideration, Jack Gibbs has decided to proceed.    [name] 10/28/2017, 9:21 AM

## 2017-10-28 NOTE — Patient Instructions (Addendum)
Buy from the Store: Miralax bottle [phone]).  Gatorade 64 oz (not red). Dulcolax tablets.   The Day Prior to Surgery: Take 4 ducolax tablets at 7am with water. Drink plenty of clear liquids all day to avoid dehydration, no solid food.    Mix the bottle of Miralax and 64 oz of Gatorade and drink this mixture starting at 10am. Drink it gradually over the next few hours, 8 ounces every 15-30 minutes until it is gone. Finish this by 2pm.  Take 2 neomycin 500mg  tablets and 2 metronidazole 500mg  tablets at 2 pm. Take 2 neomycin 500mg  tablets and 2 metronidazole 500mg  tablets at 3pm. Take 2 neomycin 500mg  tablets and 2 metronidazole 500mg  tablets at 10pm.    Do not eat or drink anything after midnight the night before your surgery.  Do not eat or drink anything that morning, and take medications as instructed by the hospital staff on your preoperative visit.    Laparoscopic hand assisted partial colectomy colectomy Laparoscopic colectomy is surgery to remove part or all of the large intestine (colon). This procedure may be used to treat several conditions, including:  Inflammation and infection of the colon (diverticulitis).  Tumors or masses in the colon.  Inflammatory bowel disease, such as Crohn disease or ulcerative colitis. Colectomy is an option when symptoms cannot be controlled with medicines.  Bleeding from the colon that cannot be controlled by another method.  Blockage or obstruction of the colon.  Tell a health care provider about:  Any allergies you have.  All medicines you are taking, including vitamins, herbs, eye drops, creams, and over-the-counter medicines.  Any problems you or family members have had with anesthetic medicines.  Any blood disorders you have.  Any surgeries you have had.  Any medical conditions you have. What are the risks? Generally, this is a safe procedure. However, problems may occur, including:  Infection.  Bleeding.  Allergic reactions  to medicines or dyes.  Damage to other structures or organs.  Leaking from where the colon was sewn together.  Future blockage of the small intestines from scar tissue. Another surgery may be needed to repair this.  Needing to convert to an open procedure. Complications such as damage to other organs or excessive bleeding may require the surgeon to convert from a laparoscopic procedure to an open procedure. This involves making a larger incision in the abdomen.   Medicines  Ask your health care provider about: ? Changing or stopping your regular medicines. This is especially important if you are taking diabetes medicines or blood thinners. ? Taking medicines such as aspirin and ibuprofen. These medicines can thin your blood. Do not take these medicines before your procedure if your health care provider instructs you not to.  You may be given antibiotic medicine to clean out bacteria from your colon. Follow the directions carefully and take the medicine at the correct time. General instructions  You may be prescribed an oral bowel prep to clean out your colon in preparation for the surgery: ? Follow instructions from your health care provider about how to do this. ? Do not eat or drink anything else after you have started the bowel prep, unless your health care provider tells you it is safe to do so.  Do not use any products that contain nicotine or tobacco, such as cigarettes and e-cigarettes. If you need help quitting, ask your health care provider. What happens during the procedure?  To reduce your risk of infection: ? Your health care team  will wash or sanitize their hands. ? Your skin will be washed with soap.  An IV tube will be inserted into one of your veins to deliver fluid and medication.  You will be given one of the following: ? A medicine to help you relax (sedative). ? A medicine to make you fall asleep (general anesthetic).  Small monitors will be connected to your  body. They will be used to check your heart, blood pressure, and oxygen level.  A breathing tube may be placed into your lungs during the procedure.  A thin, flexible tube (catheter) will be placed into your bladder to drain urine.  A tube may be placed through your nose and into your stomach to drain stomach fluids (nasogastric tube, or NG tube).  Your abdomen will be filled with air so it expands. This gives the surgeon more room to operate and makes your organs easier to see.  Several small cuts (incisions) will be made in your abdomen.  A thin, lighted tube with a tiny camera on the end (laparoscope) will be put through one of the small incisions. The camera on the laparoscope will send a picture to a computer screen in the operating room. This will give the surgeon a good view inside your abdomen.  Hollow tubes will be put through the other small incisions in your abdomen. The tools that are needed for the procedure will be put through these tubes.  Clamps or staples will be put on both ends of the diseased part of the colon.  The part of the intestine between the clamps or staples will be removed.  If possible, the ends of the healthy colon that remain will be stitched (sutured) or stapled together to allow your body to pass waste (stool).  Sometimes, the remaining colon cannot be stitched back together. If this is the case, a colostomy will be needed. If you need a colostomy: ? An opening to the outside of your body (stoma) will be made through your abdomen. ? The end of your colon will be brought to the opening. It will be stitched to the skin. ? A bag will be attached to the opening. Stool will drain into this removable bag. ? The colostomy may be temporary or permanent.  The incisions from the colectomy will be closed with sutures or staples. The procedure may vary among health care providers and hospitals. What happens after the procedure?  Your blood pressure, heart rate,  breathing rate, and blood oxygen level will be monitored until the medicines you were given have worn off.  You will receive fluids through an IV tube until your bowels start to work properly.  Once your bowels are working again, you will be given clear liquids first and then solid food as tolerated.  You will be given medicines to control your pain and nausea, if needed.  Do not drive for 24 hours if you were given a sedative. This information is not intended to replace advice given to you by your health care provider. Make sure you discuss any questions you have with your health care provider. Document Released: 08/17/2002 Document Revised: 02/26/2016 Document Reviewed: 02/26/2016 Elsevier Interactive Patient Education  [employer].

## 2017-10-29 ENCOUNTER — Ambulatory Visit (HOSPITAL_COMMUNITY)
Admission: RE | Admit: 2017-10-29 | Discharge: 2017-10-29 | Disposition: A | Discharge status: Home / Self Care | Payer: Medicare HMO | Source: Ambulatory Visit | Attending: Internal Medicine | Admitting: Internal Medicine

## 2017-10-29 DIAGNOSIS — N2889 Other specified disorders of kidney and ureter: Secondary | ICD-10-CM | POA: Diagnosis present

## 2017-10-29 DIAGNOSIS — I700 Atherosclerosis of aorta: Secondary | ICD-10-CM | POA: Insufficient documentation

## 2017-10-29 DIAGNOSIS — K639 Disease of intestine, unspecified: Secondary | ICD-10-CM | POA: Diagnosis not present

## 2017-10-29 MED ORDER — GADOBENATE DIMEGLUMINE 529 MG/ML IV SOLN
15.0000 mL | Freq: Once | INTRAVENOUS | Status: AC | PRN
  Administered 2017-10-29: 15 mL via INTRAVENOUS

## 2017-10-31 ENCOUNTER — Telehealth: Payer: Self-pay | Admitting: General Surgery

## 2017-10-31 NOTE — Patient Instructions (Addendum)
Jack Gibbs  10/31/2017     @PREFPERIOPPHARMACY @   Your procedure is scheduled on 11/10/2017.  Report to [facility] at 7:00 A.M.  Call this number if you have problems the morning of surgery:  [phone]   Remember: Colon prep given to the patient and Rx sent to pharmacy:  Buy from the Store: Miralax bottle (288g).  Gatorade 64 oz (not red). Dulcolax tablets.   The Day Prior to Surgery: Take 4 ducolax tablets at 7am with water. Drink plenty of clear liquids all day to avoid dehydration, no solid food.   Mix thebottle of Miralax and 64 oz of Gatorade and drink this mixture starting at 10am. Drinkit gradually over the next few hours,8 ounces every 15-30 minutes until it is gone. Finish this by 2pm.  Take 2 neomycin 500mg  tablets and 2 metronidazole 500mg  tablets at 2 pm. Take 2 neomycin 500mg  tablets and 2 metronidazole 500mg  tablets at 3pm. Take 2 neomycin 500mg  tablets and 2 metronidazole 500mg  tablets at 10pm.   Do not eat or drink anything after midnight the night before your surgery.  Do not eat or drink anything that morning, and take medications as instructed by the hospital staff on your preoperative visit.     Take these medicines the morning of surgery with A SIP OF WATER Fladyl, Neomycin    Do not wear jewelry, make-up or nail polish.  Do not wear lotions, powders, or perfumes, or deodorant.  Do not shave 48 hours prior to surgery.  Men may shave face and neck.  Do not bring valuables to the hospital.  [facility] is not responsible for any belongings or valuables.  Contacts, dentures or bridgework may not be worn into surgery.  Leave your suitcase in the car.  After surgery it may be brought to your room.  For patients admitted to the hospital, discharge time will be determined by your treatment team.  Patients discharged the day of surgery will not be allowed to drive home.    Please read over the following fact sheets that you were  given. Surgical Site Infection Prevention, Anesthesia Post-op Instructions and Care and Recovery After Surgery     PATIENT INSTRUCTIONS POST-ANESTHESIA  IMMEDIATELY FOLLOWING SURGERY:  Do not drive or operate machinery for the first twenty four hours after surgery.  Do not make any important decisions for twenty four hours after surgery or while taking narcotic pain medications or sedatives.  If you develop intractable nausea and vomiting or a severe headache please notify your doctor immediately.  FOLLOW-UP:  Please make an appointment with your surgeon as instructed. You do not need to follow up with anesthesia unless specifically instructed to do so.  WOUND CARE INSTRUCTIONS (if applicable):  Keep a dry clean dressing on the anesthesia/puncture wound site if there is drainage.  Once the wound has quit draining you may leave it open to air.  Generally you should leave the bandage intact for twenty four hours unless there is drainage.  If the epidural site drains for more than 36-48 hours please call the anesthesia department.  QUESTIONS?:  Please feel free to call your physician or the hospital operator if you have any questions, and they will be happy to assist you.      Laparoscopic Colectomy Laparoscopic colectomy is surgery to remove part or all of the large intestine (colon). This procedure may be used to treat several conditions, including:  Inflammation and infection of the colon (diverticulitis).  Tumors or masses  in the colon.  Inflammatory bowel disease, such as Crohn disease or ulcerative colitis. Colectomy is an option when symptoms cannot be controlled with medicines.  Bleeding from the colon that cannot be controlled by another method.  Blockage or obstruction of the colon.  Tell a health care provider about:  Any allergies you have.  All medicines you are taking, including vitamins, herbs, eye drops, creams, and over-the-counter medicines.  Any problems you or  family members have had with anesthetic medicines.  Any blood disorders you have.  Any surgeries you have had.  Any medical conditions you have. What are the risks? Generally, this is a safe procedure. However, problems may occur, including:  Infection.  Bleeding.  Allergic reactions to medicines or dyes.  Damage to other structures or organs.  Leaking from where the colon was sewn together.  Future blockage of the small intestines from scar tissue. Another surgery may be needed to repair this.  Needing to convert to an open procedure. Complications such as damage to other organs or excessive bleeding may require the surgeon to convert from a laparoscopic procedure to an open procedure. This involves making a larger incision in the abdomen.  What happens before the procedure? Staying hydrated Follow instructions from your health care provider about hydration, which may include:  Up to 2 hours before the procedure - you may continue to drink clear liquids, such as water, clear fruit juice, black coffee, and plain tea.  Eating and drinking restrictions Follow instructions from your health care provider about eating and drinking, which may include:  8 hours before the procedure - stop eating heavy meals, meals with high fiber, or foods such as meat, fried foods, or fatty foods.  6 hours before the procedure - stop eating light meals or foods, such as toast or cereal.  6 hours before the procedure - stop drinking milk or drinks that contain milk.  2 hours before the procedure - stop drinking clear liquids.  Medicines  Ask your health care provider about: ? Changing or stopping your regular medicines. This is especially important if you are taking diabetes medicines or blood thinners. ? Taking medicines such as aspirin and ibuprofen. These medicines can thin your blood. Do not take these medicines before your procedure if your health care provider instructs you not to.  You  may be given antibiotic medicine to clean out bacteria from your colon. Follow the directions carefully and take the medicine at the correct time. General instructions  You may be prescribed an oral bowel prep to clean out your colon in preparation for the surgery: ? Follow instructions from your health care provider about how to do this. ? Do not eat or drink anything else after you have started the bowel prep, unless your health care provider tells you it is safe to do so.  Do not use any products that contain nicotine or tobacco, such as cigarettes and e-cigarettes. If you need help quitting, ask your health care provider. What happens during the procedure?  To reduce your risk of infection: ? Your health care team will wash or sanitize their hands. ? Your skin will be washed with soap.  An IV tube will be inserted into one of your veins to deliver fluid and medication.  You will be given one of the following: ? A medicine to help you relax (sedative). ? A medicine to make you fall asleep (general anesthetic).  Small monitors will be connected to your body. They will be  used to check your heart, blood pressure, and oxygen level.  A breathing tube may be placed into your lungs during the procedure.  A thin, flexible tube (catheter) will be placed into your bladder to drain urine.  A tube may be placed through your nose and into your stomach to drain stomach fluids (nasogastric tube, or NG tube).  Your abdomen will be filled with air so it expands. This gives the surgeon more room to operate and makes your organs easier to see.  Several small cuts (incisions) will be made in your abdomen.  A thin, lighted tube with a tiny camera on the end (laparoscope) will be put through one of the small incisions. The camera on the laparoscope will send a picture to a computer screen in the operating room. This will give the surgeon a good view inside your abdomen.  Hollow tubes will be put  through the other small incisions in your abdomen. The tools that are needed for the procedure will be put through these tubes.  Clamps or staples will be put on both ends of the diseased part of the colon.  The part of the intestine between the clamps or staples will be removed.  If possible, the ends of the healthy colon that remain will be stitched (sutured) or stapled together to allow your body to pass waste (stool).  Sometimes, the remaining colon cannot be stitched back together. If this is the case, a colostomy will be needed. If you need a colostomy: ? An opening to the outside of your body (stoma) will be made through your abdomen. ? The end of your colon will be brought to the opening. It will be stitched to the skin. ? A bag will be attached to the opening. Stool will drain into this removable bag. ? The colostomy may be temporary or permanent.  The incisions from the colectomy will be closed with sutures or staples. The procedure may vary among health care providers and hospitals. What happens after the procedure?  Your blood pressure, heart rate, breathing rate, and blood oxygen level will be monitored until the medicines you were given have worn off.  You will receive fluids through an IV tube until your bowels start to work properly.  Once your bowels are working again, you will be given clear liquids first and then solid food as tolerated.  You will be given medicines to control your pain and nausea, if needed.  Do not drive for 24 hours if you were given a sedative. This information is not intended to replace advice given to you by your health care provider. Make sure you discuss any questions you have with your health care provider. Document Released: 08/17/2002 Document Revised: 02/26/2016 Document Reviewed: 02/26/2016 Elsevier Interactive Patient Education  2018 [employer]. Open Colectomy An open colectomy is surgery to remove part or all of the large intestine  (colon). This procedure may be used to treat several conditions, including:  Inflammation and infection of the colon (diverticulitis).  Tumors or masses in the colon.  Inflammatory bowel disease, such as Crohn disease or ulcerative colitis.  Bleeding from the colon.  Blockage or obstruction of the colon.  Tell a health care provider about:  Any allergies you have.  All medicines you are taking, including vitamins, herbs, eye drops, creams, and over-the-counter medicines.  Any problems you or family members have had with anesthetic medicines.  Any blood disorders you have.  Any surgeries you have had.  Any medical conditions you  have.  Whether you are pregnant or may be pregnant.  Whether you smoke or use tobacco products. These can affect your body's reaction to anesthesia. What are the risks? Generally, this is a safe procedure. However, problems may occur, including:  Infection.  Bleeding.  Allergic reactions to medicines.  Damage to other structures or organs.  Pneumonia.  The incision opening up.  Tissues from inside the abdomen bulging through the incision (hernia).  Reopening of the colon where it was stitched or stapled together.  A blood clot forming in a vein and traveling to the lungs.  Future blockage of the small intestine from scar tissue.  What happens before the procedure? Staying hydrated Follow instructions from your health care provider about hydration, which may include:  Up to 2 hours before the procedure - you may continue to drink clear liquids, such as water, clear fruit juice, black coffee, and plain tea.  Eating and drinking restrictions Follow instructions from your health care provider about eating and drinking, which may include:  8 hours before the procedure - stop eating heavy meals or foods such as meat, fried foods, or fatty foods.  6 hours before the procedure - stop eating light meals or foods, such as toast or  cereal.  6 hours before the procedure - stop drinking milk or drinks that contain milk.  2 hours before the procedure - stop drinking clear liquids.  Bowel prep In some cases, you may be prescribed an oral bowel prep to clean out your colon. If so:  Take it as told by your health care provider. Starting the day before your procedure, you may need to drink a large amount of medicated liquid. The liquid will cause you to have multiple loose stools until your stool is almost clear or light green.  Follow instructions from your health care provider about eating and drinking restrictions during bowel prep.  Medicines  Ask your health care provider about: ? Changing or stopping your regular medicines or vitamins. This is especially important if you are taking diabetes medicines, blood thinners, or vitamin E. ? Taking medicines such as aspirin and ibuprofen. These medicines can thin your blood. Do not take these medicines before your procedure if your health care provider instructs you not to.  If you were prescribed an antibiotic medicine, take it as told by your health care provider. General instructions  Bring loose-fitting, comfortable clothing and slip-on shoes that you can put on without bending over.  Make sure to see your health care provider for any tests that you need before the procedure, such as: ? Blood tests. ? A test to check the heart's rhythm (electrocardiogram, ECG). ? A CT scan of your abdomen. ? Urine tests. ? Colonoscopy.  Plan to have someone take you home from the hospital or clinic.  Arrange for someone to help you with your activities during your recovery. What happens during the procedure?  To reduce your risk of infection: ? Your health care team will wash or sanitize their hands. ? Your skin will be washed with soap. ? Hair may be removed from the surgical area.  An IV tube will be inserted into one of your veins. The tube will be used to give you  medicines and fluids.  You will be given a medicine to make you fall asleep (general anesthetic). You may also be given a medicine to help you relax (sedative).  Small monitors will be connected to your body. They will be used to check  your heart, blood pressure, and oxygen level.  A breathing tube may be placed into your lungs during the procedure.  A thin, flexible tube (catheter) will be placed into your bladder to drain urine.  A tube may be inserted through your nose and into your stomach (nasogastric tube, or NG tube). The tube is used to remove stomach fluids after surgery until the intestines start working again.  An incision will be made in your abdomen.  Clamps or staples will be put on your colon.  The part of the colon between the clamps or staples will be removed.  The ends of the colon that remain will be stitched or stapled together.  The incision in your abdomen will be closed with stitches (sutures) or staples.  The incision will be covered with a bandage (dressing).  A small opening (stoma) may be created in your lower abdomen. A removable, external pouch (ostomy pouch) will be attached to the stoma. This pouch will collect stool outside of your body. Stool passes through the stoma and into the pouch instead of through your anus. The procedure may vary among health care providers and hospitals. What happens after the procedure?  Your blood pressure, heart rate, breathing rate, and blood oxygen level will be monitored until the medicines you were given have worn off.  You may continue to receive fluids and medicines through an IV tube.  You will start on a clear liquid diet and gradually go back to a normal diet.  Do not drive until your health care provider approves.  You may have some pain in your abdomen. You will be given pain medicine to control the pain.  You will be encouraged to do the following: ? Do breathing exercises to prevent pneumonia. ? Get up  and start walking within a day after surgery. You should try to get up 5-6 times a day. This information is not intended to replace advice given to you by your health care provider. Make sure you discuss any questions you have with your health care provider. Document Released: 03/24/2009 Document Revised: 02/26/2016 Document Reviewed: 02/26/2016 Elsevier Interactive Patient Education  [employer].

## 2017-10-31 NOTE — Telephone Encounter (Signed)
[facility]  Called to tell patient, MRI that Dr. Gala Gibbs had ordered is concerning for renal cell on the right.  I have messaged Dr. Alyson Gibbs regarding this area. It is 1.5cm on the MRI.  Will determine plan and let Jack Gibbs Know. Will still keep the preop appt for Tuesday for now.   May need to do combined surgery, and possibly given urology involvement/ partial nephrectomy total nephrectomy potential, may not be able to occur at [facility]. Will wait to see what Jack Gibbs says before changing schedule.  Jack Labrum, MD [facility] [address] [place], [place] [address] [phone] (office)  .

## 2017-11-04 ENCOUNTER — Other Ambulatory Visit: Payer: Self-pay

## 2017-11-04 ENCOUNTER — Ambulatory Visit (HOSPITAL_COMMUNITY): Payer: Medicare HMO

## 2017-11-04 ENCOUNTER — Encounter (HOSPITAL_COMMUNITY): Payer: Self-pay

## 2017-11-04 ENCOUNTER — Encounter (HOSPITAL_COMMUNITY)
Admission: RE | Admit: 2017-11-04 | Discharge: 2017-11-04 | Disposition: A | Discharge status: Home / Self Care | Payer: Medicare HMO | Source: Ambulatory Visit | Attending: General Surgery | Admitting: General Surgery

## 2017-11-04 DIAGNOSIS — Z01810 Encounter for preprocedural cardiovascular examination: Secondary | ICD-10-CM | POA: Insufficient documentation

## 2017-11-04 DIAGNOSIS — Z01812 Encounter for preprocedural laboratory examination: Secondary | ICD-10-CM | POA: Diagnosis present

## 2017-11-04 HISTORY — DX: Malignant (primary) neoplasm, unspecified: C80.1

## 2017-11-04 LAB — CBC
HCT: 27.6 % — ABNORMAL LOW (ref 39.0–52.0)
Hemoglobin: 8.6 g/dL — ABNORMAL LOW (ref 13.0–17.0)
MCH: 22.8 pg — AB (ref 26.0–34.0)
MCHC: 31.2 g/dL (ref 30.0–36.0)
MCV: 73 fL — AB (ref 78.0–100.0)
PLATELETS: 366 10*3/uL (ref 150–400)
RBC: 3.78 MIL/uL — ABNORMAL LOW (ref 4.22–5.81)
RDW: 14.5 % (ref 11.5–15.5)
WBC: 8.4 10*3/uL (ref 4.0–10.5)

## 2017-11-04 LAB — BASIC METABOLIC PANEL
Anion gap: 9 (ref 5–15)
BUN: 6 mg/dL (ref 6–20)
CALCIUM: 8.6 mg/dL — AB (ref 8.9–10.3)
CHLORIDE: 100 mmol/L — AB (ref 101–111)
CO2: 21 mmol/L — AB (ref 22–32)
CREATININE: 0.79 mg/dL (ref 0.61–1.24)
GFR calc Af Amer: >60 mL/min (ref >60)
GFR calc non Af Amer: >60 mL/min (ref >60)
Glucose, Bld: 96 mg/dL (ref 65–99)
Potassium: 3.3 mmol/L — ABNORMAL LOW (ref 3.5–5.1)
Sodium: 130 mmol/L — ABNORMAL LOW (ref 135–145)

## 2017-11-04 LAB — PREPARE RBC (CROSSMATCH)

## 2017-11-05 ENCOUNTER — Telehealth: Payer: Self-pay | Admitting: General Surgery

## 2017-11-05 NOTE — Telephone Encounter (Signed)
[facility]  Discussed with Dr. Alyson Ingles, Urology regarding Right Kidney mass concerning for Renal Cell Cancer. He said that he would follow due to the size and location.   Will refer to Dr. Alyson Ingles. Proceed with Lap R colectomy on Monday.  Curlene Labrum, MD [facility] [address] [place], [place] [address] [phone] (office)

## 2017-11-05 NOTE — Progress Notes (Signed)
HGB 8.6 called to Dr Constance Haw.  Value is chronic.

## 2017-11-10 ENCOUNTER — Inpatient Hospital Stay (HOSPITAL_COMMUNITY)
Admission: RE | Admit: 2017-11-10 | Discharge: 2017-11-13 | DRG: 331 | Disposition: A | Discharge status: Home / Self Care | Payer: Medicare HMO | Attending: General Surgery | Admitting: General Surgery

## 2017-11-10 ENCOUNTER — Encounter (HOSPITAL_COMMUNITY)
Admission: RE | Disposition: A | Discharge status: Home / Self Care | Payer: Self-pay | Source: Home / Self Care | Attending: General Surgery

## 2017-11-10 ENCOUNTER — Encounter (HOSPITAL_COMMUNITY): Payer: Self-pay

## 2017-11-10 ENCOUNTER — Inpatient Hospital Stay (HOSPITAL_COMMUNITY): Payer: Medicare HMO | Admitting: Anesthesiology

## 2017-11-10 DIAGNOSIS — M545 Low back pain: Secondary | ICD-10-CM | POA: Diagnosis present

## 2017-11-10 DIAGNOSIS — D509 Iron deficiency anemia, unspecified: Secondary | ICD-10-CM | POA: Diagnosis present

## 2017-11-10 DIAGNOSIS — C182 Malignant neoplasm of ascending colon: Secondary | ICD-10-CM | POA: Diagnosis present

## 2017-11-10 DIAGNOSIS — K660 Peritoneal adhesions (postprocedural) (postinfection): Secondary | ICD-10-CM | POA: Diagnosis present

## 2017-11-10 DIAGNOSIS — K219 Gastro-esophageal reflux disease without esophagitis: Secondary | ICD-10-CM | POA: Diagnosis present

## 2017-11-10 DIAGNOSIS — K5900 Constipation, unspecified: Secondary | ICD-10-CM | POA: Diagnosis present

## 2017-11-10 DIAGNOSIS — Z823 Family history of stroke: Secondary | ICD-10-CM | POA: Diagnosis not present

## 2017-11-10 DIAGNOSIS — C189 Malignant neoplasm of colon, unspecified: Secondary | ICD-10-CM | POA: Diagnosis present

## 2017-11-10 DIAGNOSIS — E785 Hyperlipidemia, unspecified: Secondary | ICD-10-CM | POA: Diagnosis present

## 2017-11-10 HISTORY — PX: COLON RESECTION: SHX5231

## 2017-11-10 SURGERY — COLECTOMY, HAND-ASSISTED, LAPAROSCOPIC
Anesthesia: General | Site: Abdomen

## 2017-11-10 MED ORDER — ONDANSETRON HCL 4 MG/2ML IJ SOLN: 4.0000 mg | Freq: Four times a day (QID) | INTRAMUSCULAR | Status: DC | PRN

## 2017-11-10 MED ORDER — PROMETHAZINE HCL 25 MG/ML IJ SOLN
6.2500 mg | INTRAMUSCULAR | Status: DC | PRN
  Administered 2017-11-10: 12.5 mg via INTRAVENOUS
  Filled 2017-11-10: qty 1

## 2017-11-10 MED ORDER — PANTOPRAZOLE SODIUM 40 MG PO TBEC
40.0000 mg | DELAYED_RELEASE_TABLET | Freq: Every day | ORAL | Status: DC
  Administered 2017-11-10 – 2017-11-13 (×4): 40 mg via ORAL
  Filled 2017-11-10 (×5): qty 1

## 2017-11-10 MED ORDER — HYDROMORPHONE HCL 1 MG/ML IJ SOLN: 0.2500 mg | INTRAMUSCULAR | Status: DC | PRN

## 2017-11-10 MED ORDER — SUGAMMADEX SODIUM 200 MG/2ML IV SOLN
INTRAVENOUS | Status: DC | PRN
  Administered 2017-11-10: 137 mg via INTRAVENOUS

## 2017-11-10 MED ORDER — ROCURONIUM BROMIDE 100 MG/10ML IV SOLN
INTRAVENOUS | Status: DC | PRN
  Administered 2017-11-10: 50 mg via INTRAVENOUS

## 2017-11-10 MED ORDER — ONDANSETRON HCL 4 MG/2ML IJ SOLN
INTRAMUSCULAR | Status: DC | PRN
  Administered 2017-11-10: 4 mg via INTRAVENOUS

## 2017-11-10 MED ORDER — MEPERIDINE HCL 50 MG/ML IJ SOLN: 6.2500 mg | INTRAMUSCULAR | Status: DC | PRN

## 2017-11-10 MED ORDER — MORPHINE SULFATE (PF) 2 MG/ML IV SOLN: 2.0000 mg | INTRAVENOUS | Status: DC | PRN

## 2017-11-10 MED ORDER — LACTATED RINGERS IV SOLN
INTRAVENOUS | Status: DC
  Administered 2017-11-10 – 2017-11-11 (×3): via INTRAVENOUS

## 2017-11-10 MED ORDER — SODIUM CHLORIDE 0.9 % IV SOLN
2.0000 g | Freq: Two times a day (BID) | INTRAVENOUS | Status: AC
  Administered 2017-11-10 – 2017-11-11 (×2): 2 g via INTRAVENOUS
  Filled 2017-11-10 (×2): qty 2

## 2017-11-10 MED ORDER — TRAMADOL HCL 50 MG PO TABS: 50.0000 mg | ORAL_TABLET | Freq: Four times a day (QID) | ORAL | Status: DC | PRN

## 2017-11-10 MED ORDER — METOPROLOL TARTRATE 5 MG/5ML IV SOLN: 5.0000 mg | Freq: Four times a day (QID) | INTRAVENOUS | Status: DC | PRN

## 2017-11-10 MED ORDER — DOCUSATE SODIUM 100 MG PO CAPS
100.0000 mg | ORAL_CAPSULE | Freq: Two times a day (BID) | ORAL | Status: DC
  Administered 2017-11-10 – 2017-11-13 (×5): 100 mg via ORAL
  Filled 2017-11-10 (×8): qty 1

## 2017-11-10 MED ORDER — BUPIVACAINE LIPOSOME 1.3 % IJ SUSP
INTRAMUSCULAR | Status: DC | PRN
  Administered 2017-11-10: 20 mL

## 2017-11-10 MED ORDER — 0.9 % SODIUM CHLORIDE (POUR BTL) OPTIME
TOPICAL | Status: DC | PRN
  Administered 2017-11-10 (×3): 1000 mL

## 2017-11-10 MED ORDER — BUPIVACAINE LIPOSOME 1.3 % IJ SUSP
INTRAMUSCULAR | Status: AC
  Filled 2017-11-10: qty 20

## 2017-11-10 MED ORDER — LACTATED RINGERS IV SOLN: INTRAVENOUS | Status: DC

## 2017-11-10 MED ORDER — SUGAMMADEX SODIUM 200 MG/2ML IV SOLN
INTRAVENOUS | Status: AC
  Filled 2017-11-10: qty 2

## 2017-11-10 MED ORDER — KETOROLAC TROMETHAMINE 30 MG/ML IJ SOLN
30.0000 mg | Freq: Four times a day (QID) | INTRAMUSCULAR | Status: AC
  Administered 2017-11-10: 30 mg via INTRAVENOUS
  Filled 2017-11-10: qty 1

## 2017-11-10 MED ORDER — ACETAMINOPHEN 500 MG PO TABS
1000.0000 mg | ORAL_TABLET | Freq: Four times a day (QID) | ORAL | Status: DC
  Administered 2017-11-10 – 2017-11-11 (×5): 1000 mg via ORAL
  Filled 2017-11-10 (×9): qty 2

## 2017-11-10 MED ORDER — ONDANSETRON HCL 4 MG/2ML IJ SOLN
INTRAMUSCULAR | Status: AC
  Filled 2017-11-10: qty 2

## 2017-11-10 MED ORDER — LACTATED RINGERS IV SOLN
INTRAVENOUS | Status: DC
  Administered 2017-11-10 (×3): via INTRAVENOUS

## 2017-11-10 MED ORDER — SODIUM CHLORIDE 0.9% FLUSH
INTRAVENOUS | Status: AC
  Filled 2017-11-10: qty 10

## 2017-11-10 MED ORDER — CEFOTETAN DISODIUM-DEXTROSE 2-2.08 GM-%(50ML) IV SOLR
2.0000 g | Freq: Two times a day (BID) | INTRAVENOUS | Status: DC
  Filled 2017-11-10 (×2): qty 50

## 2017-11-10 MED ORDER — ALVIMOPAN 12 MG PO CAPS
12.0000 mg | ORAL_CAPSULE | ORAL | Status: AC
  Administered 2017-11-10: 12 mg via ORAL
  Filled 2017-11-10: qty 1

## 2017-11-10 MED ORDER — OXYCODONE HCL 5 MG PO TABS: 5.0000 mg | ORAL_TABLET | ORAL | Status: DC | PRN

## 2017-11-10 MED ORDER — FENTANYL CITRATE (PF) 250 MCG/5ML IJ SOLN
INTRAMUSCULAR | Status: AC
  Filled 2017-11-10: qty 5

## 2017-11-10 MED ORDER — ACETAMINOPHEN 500 MG PO TABS
1000.0000 mg | ORAL_TABLET | ORAL | Status: AC
  Administered 2017-11-10: 1000 mg via ORAL
  Filled 2017-11-10: qty 2

## 2017-11-10 MED ORDER — FENTANYL CITRATE (PF) 100 MCG/2ML IJ SOLN
INTRAMUSCULAR | Status: DC | PRN
  Administered 2017-11-10 (×2): 25 ug via INTRAVENOUS
  Administered 2017-11-10 (×3): 50 ug via INTRAVENOUS
  Administered 2017-11-10 (×2): 25 ug via INTRAVENOUS

## 2017-11-10 MED ORDER — HEPARIN SODIUM (PORCINE) 5000 UNIT/ML IJ SOLN
5000.0000 [IU] | Freq: Once | INTRAMUSCULAR | Status: AC
  Administered 2017-11-10: 5000 [IU] via SUBCUTANEOUS
  Filled 2017-11-10: qty 1

## 2017-11-10 MED ORDER — PROPOFOL 10 MG/ML IV BOLUS
INTRAVENOUS | Status: DC | PRN
  Administered 2017-11-10: 100 mg via INTRAVENOUS
  Administered 2017-11-10: 20 mg via INTRAVENOUS

## 2017-11-10 MED ORDER — ROCURONIUM BROMIDE 50 MG/5ML IV SOLN
INTRAVENOUS | Status: AC
  Filled 2017-11-10: qty 1

## 2017-11-10 MED ORDER — KETOROLAC TROMETHAMINE 30 MG/ML IJ SOLN: 30.0000 mg | Freq: Four times a day (QID) | INTRAMUSCULAR | Status: DC | PRN

## 2017-11-10 MED ORDER — HYDROCODONE-ACETAMINOPHEN 7.5-325 MG PO TABS: 1.0000 | ORAL_TABLET | Freq: Once | ORAL | Status: DC | PRN

## 2017-11-10 MED ORDER — CHLORHEXIDINE GLUCONATE CLOTH 2 % EX PADS: 6.0000 | MEDICATED_PAD | Freq: Once | CUTANEOUS | Status: DC

## 2017-11-10 MED ORDER — HEPARIN SODIUM (PORCINE) 5000 UNIT/ML IJ SOLN
5000.0000 [IU] | Freq: Three times a day (TID) | INTRAMUSCULAR | Status: DC
  Administered 2017-11-10 – 2017-11-12 (×6): 5000 [IU] via SUBCUTANEOUS
  Filled 2017-11-10 (×8): qty 1

## 2017-11-10 MED ORDER — GABAPENTIN 300 MG PO CAPS
300.0000 mg | ORAL_CAPSULE | Freq: Two times a day (BID) | ORAL | Status: DC
  Administered 2017-11-10 – 2017-11-13 (×6): 300 mg via ORAL
  Filled 2017-11-10 (×7): qty 1

## 2017-11-10 MED ORDER — CEFOTETAN DISODIUM-DEXTROSE 2-2.08 GM-%(50ML) IV SOLR
2.0000 g | INTRAVENOUS | Status: AC
  Administered 2017-11-10: 2 g via INTRAVENOUS

## 2017-11-10 MED ORDER — GABAPENTIN 300 MG PO CAPS
300.0000 mg | ORAL_CAPSULE | ORAL | Status: AC
  Administered 2017-11-10: 300 mg via ORAL
  Filled 2017-11-10: qty 1

## 2017-11-10 MED ORDER — DIPHENHYDRAMINE HCL 50 MG/ML IJ SOLN: 12.5000 mg | Freq: Four times a day (QID) | INTRAMUSCULAR | Status: DC | PRN

## 2017-11-10 MED ORDER — ONDANSETRON 4 MG PO TBDP: 4.0000 mg | ORAL_TABLET | Freq: Four times a day (QID) | ORAL | Status: DC | PRN

## 2017-11-10 MED ORDER — DIPHENHYDRAMINE HCL 12.5 MG/5ML PO ELIX: 12.5000 mg | ORAL_SOLUTION | Freq: Four times a day (QID) | ORAL | Status: DC | PRN

## 2017-11-10 SURGICAL SUPPLY — 49 items
BANDAGE STRIP 1X3 FLEXIBLE (GAUZE/BANDAGES/DRESSINGS) ×6 IMPLANT
CELLS DAT CNTRL 66122 CELL SVR (MISCELLANEOUS) ×1 IMPLANT
COVER LIGHT HANDLE STERIS (MISCELLANEOUS) ×4 IMPLANT
DERMABOND ADVANCED (GAUZE/BANDAGES/DRESSINGS) ×1
DERMABOND ADVANCED .7 DNX12 (GAUZE/BANDAGES/DRESSINGS) ×1 IMPLANT
DRAPE INCISE IOBAN 44X35 STRL (DRAPES) ×2 IMPLANT
DRAPE WARM FLUID 44X44 (DRAPE) ×2 IMPLANT
DRSG OPSITE POSTOP 4X8 (GAUZE/BANDAGES/DRESSINGS) ×2 IMPLANT
ELECT REM PT RETURN 9FT ADLT (ELECTROSURGICAL) ×2
ELECTRODE REM PT RTRN 9FT ADLT (ELECTROSURGICAL) ×1 IMPLANT
FILTER SMOKE EVAC LAPAROSHD (FILTER) ×2 IMPLANT
GLOVE BIO SURGEON STRL SZ 6.5 (GLOVE) ×4 IMPLANT
GLOVE BIOGEL PI IND STRL 6.5 (GLOVE) ×2 IMPLANT
GLOVE BIOGEL PI IND STRL 7.0 (GLOVE) ×3 IMPLANT
GLOVE BIOGEL PI INDICATOR 6.5 (GLOVE) ×2
GLOVE BIOGEL PI INDICATOR 7.0 (GLOVE) ×3
GLOVE SURG SS PI 7.5 STRL IVOR (GLOVE) ×6 IMPLANT
GOWN STRL REUS W/ TWL XL LVL3 (GOWN DISPOSABLE) ×2 IMPLANT
GOWN STRL REUS W/TWL LRG LVL3 (GOWN DISPOSABLE) ×8 IMPLANT
GOWN STRL REUS W/TWL XL LVL3 (GOWN DISPOSABLE) ×2
INST SET LAPROSCOPIC AP (KITS) ×2 IMPLANT
INST SET MAJOR GENERAL (KITS) ×2 IMPLANT
KIT TURNOVER CYSTO (KITS) ×2 IMPLANT
LIGASURE IMPACT 36 18CM CVD LR (INSTRUMENTS) ×2 IMPLANT
LIGASURE LAP ATLAS 10MM 37CM (INSTRUMENTS) ×2 IMPLANT
MANIFOLD NEPTUNE II (INSTRUMENTS) ×2 IMPLANT
NS IRRIG 1000ML POUR BTL (IV SOLUTION) ×6 IMPLANT
PACK COLON (CUSTOM PROCEDURE TRAY) ×2 IMPLANT
PAD ARMBOARD 7.5X6 YLW CONV (MISCELLANEOUS) ×2 IMPLANT
PENCIL HANDSWITCHING (ELECTRODE) ×2 IMPLANT
RELOAD PROXIMATE 75MM BLUE (ENDOMECHANICALS) ×4 IMPLANT
RTRCTR WOUND ALEXIS 18CM MED (MISCELLANEOUS) ×2
SLEEVE ENDOPATH XCEL 5M (ENDOMECHANICALS) ×2 IMPLANT
STAPLER GUN LINEAR PROX 60 (STAPLE) ×2 IMPLANT
STAPLER PROXIMATE 75MM BLUE (STAPLE) ×2 IMPLANT
STAPLER VISISTAT (STAPLE) ×2 IMPLANT
SUT CHROMIC 2 0 SH (SUTURE) ×2 IMPLANT
SUT MNCRL AB 4-0 PS2 18 (SUTURE) ×2 IMPLANT
SUT PDS AB CT VIOLET #0 27IN (SUTURE) ×4 IMPLANT
SUT SILK 3 0 SH CR/8 (SUTURE) ×2 IMPLANT
SUT VICRYL 0 UR6 27IN ABS (SUTURE) ×2 IMPLANT
SYR 20CC LL (SYRINGE) ×2 IMPLANT
TRAY FOLEY METER SIL LF 16FR (CATHETERS) ×2 IMPLANT
TROCAR ENDO BLADELESS 11MM (ENDOMECHANICALS) ×2 IMPLANT
TROCAR XCEL NON-BLD 5MMX100MML (ENDOMECHANICALS) ×2 IMPLANT
TROCAR XCEL UNIV SLVE 11M 100M (ENDOMECHANICALS) ×2 IMPLANT
TUBING INSUFFLATION (TUBING) ×2 IMPLANT
WARMER LAPAROSCOPE (MISCELLANEOUS) ×2 IMPLANT
YANKAUER SUCT BULB TIP 10FT TU (MISCELLANEOUS) ×4 IMPLANT

## 2017-11-10 NOTE — Anesthesia Procedure Notes (Signed)
Procedure Name: Intubation Date/Time: 11/10/2017 9:16 AM Performed by: Vista Deck, CRNA Pre-anesthesia Checklist: Patient identified, Patient being monitored, Timeout performed, Emergency Drugs available and Suction available Patient Re-evaluated:Patient Re-evaluated prior to induction Oxygen Delivery Method: Circle System Utilized Preoxygenation: Pre-oxygenation with 100% oxygen Induction Type: IV induction Ventilation: Mask ventilation without difficulty Laryngoscope Size: Mac and 3 Grade View: Grade II Tube type: Oral Tube size: 7.0 mm Number of attempts: 1 Airway Equipment and Method: stylet Placement Confirmation: ETT inserted through vocal cords under direct vision,  positive ETCO2 and breath sounds checked- equal and bilateral Secured at: 21 cm Tube secured with: Tape Dental Injury: Teeth and Oropharynx as per pre-operative assessment

## 2017-11-10 NOTE — Anesthesia Postprocedure Evaluation (Signed)
Anesthesia Post Note  Patient: Jack Gibbs  Procedure(s) Performed: LAPAROSCOPIC PARTIAL COLECTOMY (N/A Abdomen)  Patient location during evaluation: PACU Anesthesia Type: General Level of consciousness: awake, oriented and patient cooperative Pain management: pain level controlled Vital Signs Assessment: post-procedure vital signs reviewed and stable Respiratory status: spontaneous breathing, respiratory function stable and patient connected to face mask oxygen Cardiovascular status: stable Postop Assessment: no apparent nausea or vomiting Anesthetic complications: no     Last Vitals:  Vitals:   11/10/17 0740 11/10/17 1200  BP: 125/74 135/77  Pulse:  60  Resp: (!) 23 12  Temp:  37 C  SpO2: 100% 100%    Last Pain:  Vitals:   11/10/17 1200  TempSrc:   PainSc: 0-No pain                 ADAMS, AMY A

## 2017-11-10 NOTE — Transfer of Care (Signed)
Immediate Anesthesia Transfer of Care Note  Patient: Jack Gibbs  Procedure(s) Performed: LAPAROSCOPIC PARTIAL COLECTOMY (N/A Abdomen)  Patient Location: PACU  Anesthesia Type:General  Level of Consciousness: awake, oriented and patient cooperative  Airway & Oxygen Therapy: Patient Spontanous Breathing and Patient connected to face mask oxygen  Post-op Assessment: Report given to RN and Post -op Vital signs reviewed and stable  Post vital signs: Reviewed and stable  Last Vitals:  Vitals Value Taken Time  BP    Temp    Pulse 61 11/10/2017 11:57 AM  Resp 14 11/10/2017 11:57 AM  SpO2 97 % 11/10/2017 11:57 AM  Vitals shown include unvalidated device data.  Last Pain:  Vitals:   11/10/17 0723  TempSrc: Oral  PainSc: 0-No pain         Complications: No apparent anesthesia complications

## 2017-11-10 NOTE — Addendum Note (Signed)
Addendum  created 11/10/17 1223 by Vista Deck, CRNA   Sign clinical note

## 2017-11-10 NOTE — Interval H&P Note (Signed)
History and Physical Interval Note:  11/10/2017 8:47 AM  Jack Gibbs  has presented today for surgery, with the diagnosis of acending colon cancer  The various methods of treatment have been discussed with the patient and family. After consideration of risks, benefits and other options for treatment, the patient has consented to  Procedure(s): LAPAROSCOPIC PARTIAL COLECTOMY WITH ANASTOMOSIS (N/A) as a surgical intervention .  The patient's history has been reviewed, patient examined, no change in status, stable for surgery.  I have reviewed the patient's chart and labs.  Questions were answered to the patient's satisfaction.    No major issues. Patient asked about likelihood of getting a bag, discussed that this is rare with right sided colectomy.  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 Alyson Ingles for surveillance given size and location and inability to do partial nephrectomy.   [name]

## 2017-11-10 NOTE — Anesthesia Postprocedure Evaluation (Signed)
Anesthesia Post Note  Patient: Jack Gibbs  Procedure(s) Performed: LAPAROSCOPIC PARTIAL COLECTOMY (N/A Abdomen)  Patient location during evaluation: PACU Anesthesia Type: General Level of consciousness: awake and alert and patient cooperative Pain management: satisfactory to patient Vital Signs Assessment: post-procedure vital signs reviewed and stable Respiratory status: spontaneous breathing Cardiovascular status: stable Postop Assessment: no apparent nausea or vomiting Anesthetic complications: no     Last Vitals:  Vitals:   11/10/17 0740 11/10/17 1200  BP: 125/74 135/77  Pulse:  60  Resp: (!) 23 12  Temp:  37 C  SpO2: 100% 100%    Last Pain:  Vitals:   11/10/17 1215  TempSrc:   PainSc: 0-No pain                 Mahari Vankirk

## 2017-11-10 NOTE — Anesthesia Preprocedure Evaluation (Signed)
Anesthesia Evaluation  Patient identified by MRN, date of birth, ID band Patient awake    Reviewed: Allergy & Precautions, H&P , NPO status , Patient's Chart, lab work & pertinent test results, reviewed documented beta blocker date and time   Airway Mallampati: II  TM Distance: >3 FB Neck ROM: full    Dental no notable dental hx. (+) Edentulous Lower, Edentulous Upper, Dental Advidsory Given   Pulmonary neg pulmonary ROS,    Pulmonary exam normal breath sounds clear to auscultation       Cardiovascular Exercise Tolerance: Good negative cardio ROS   Rhythm:regular Rate:Normal     Neuro/Psych negative neurological ROS  negative psych ROS   GI/Hepatic negative GI ROS, Neg liver ROS, GERD  ,  Endo/Other  negative endocrine ROS  Renal/GU negative Renal ROS  negative genitourinary   Musculoskeletal   Abdominal   Peds  Hematology negative hematology ROS (+) anemia ,   Anesthesia Other Findings NA 130 K    3.3 Hgb 8.6 T&S 2 units  Reproductive/Obstetrics negative OB ROS                             Anesthesia Physical Anesthesia Plan  ASA: III  Anesthesia Plan: General ETT   Post-op Pain Management:    Induction:   PONV Risk Score and Plan: Ondansetron  Airway Management Planned:   Additional Equipment:   Intra-op Plan:   Post-operative Plan:   Informed Consent: I have reviewed the patients History and Physical, chart, labs and discussed the procedure including the risks, benefits and alternatives for the proposed anesthesia with the patient or authorized representative who has indicated his/her understanding and acceptance.   Dental Advisory Given  Plan Discussed with: CRNA and Anesthesiologist  Anesthesia Plan Comments:         Anesthesia Quick Evaluation

## 2017-11-10 NOTE — Op Note (Signed)
[facility] Operative Note  11/10/17  Preoperative Diagnosis: Ascending colon cancer    Postoperative Diagnosis: Same   Procedure(s) Performed: Laparoscopic right hemicolectomy (extracorporal anastomosis)    Surgeon: Lanell Matar. Constance Haw, MD   Assistants: Aviva Signs, MD    Anesthesia: General endotracheal   Anesthesiologist: Vena Rua, MD    Specimens:  Right colon with adherent omentum    Estimated Blood Loss: Minimal   Blood Replacement: None    Complications: None    Wound Class:Clean contaminated    Operative Indications: Mr. Xue is a 62 yo with anemia that underwent colonoscopy and was found to have a right sided ascending colon cancer. He was worked up and had a CT A/p and chest that demonstrated no evidence of metastasis, but there was a concern for a right renal cell cancer. He underwent an MRI, and I discussed the case with Dr. Alyson Ingles, Urology, who felt that this small area could be followed and did not need surgery at this time due to the size and location, which would prevent a partial nephrectomy and require a total nephrectomy.  After a discussion of the risk and benefits of surgery, Mr. Castille opted to proceed. He completed a bowel prep prior to surgery.   Findings: Ascending/ hepatic flexure mass with adhesions to the anterior abdominal wall with adherent omentum    Procedure: The patient was taken to the operating room and placed supine. General endotracheal anesthesia was induced. Intravenous antibiotics were administered per protocol.  An orogastric tube positioned to decompress the stomach and a foley catheter was placed. The abdomen was prepared and draped in the usual sterile fashion.   A JACHO approved time out was performed.  A supraumbilical incision was made, and the Veress needle was placed. The saline drop test was used to confirm appropriate placement, and low insufflation pressures were noted, indicating  intraperitoneal location.  The abdomen was insufflated, and a 11 mm optiview port was used to enter the abdomen. No evidence of injury was noted under the area of the Veress or port placement.  The abdomen was inspected. There was no signs of peritoneal implants or area on the liver that could be seen.  The ascending colon mass was adherent to the anterior abdominal wall on the RUQ with omentum adherent between the mass and the peritoneum.    Two additional 5 mm trocars was placed under direct visualization in the suprapubic area and epigastric area, and a additional 16mm trocar was placed in the LUQ.  The patient was placed in the reverse trendelenburg position with the right side up. The small bowel was cleared from the operative flied.  A 10 mm 30 degree scope was used, and the terminal ileum/ cecum was grasped and retraced up and the ileocolic vessels were appreciated.  The duodenum could also be seen easily.  At the base of the ileocolic vessels, the dissection was started and the peritoneum overlying the vessels was scored and the ileocolic vessels were dissected out using blunt dissection, and the the open was carried to the lateral side wall and up along the ascending colon.  The ileocolic vessels were divided with a Ligasure, taking care to protect the duodenum but ensuring we were as close to the base as possible.  The dissection proceeded in the retroperitoneum until the side wall was clearly reached, and the cecum was being tethered by lateral attachments. The right ureter was noted deep to the plane of dissection.  We then started to  work lateral to medially, taking down the remaining white line of Toldt and connecting it to the retroperitoneal dissection medially.  Again care was taken to protect the duodenum and the ureter in the retroperitoneum.  There was considerable omental attachments in the hepatic flexure which were taken down and allowed for mobilization of the transverse colon and hepatic  flexure.  The omentum that was adherent to the ascending colon mass was left en bloc with the underlying mass.    At this time, it appeared that enough mobilization had been completed laparoscopically. An extraction site was created by extending the supraumbilical port site inferiorly for about 5cm, and a small wound protector was placed.  The ascending colon and transverse colon could be delivered out, but there were some attachments of the appendix to the lateral side wall that were taken down with care.  The right ureter was noted and was protected.  The terminal ileum was divided using linear cutting stapler, and the remaining mesentery was taken with a Ligasure device.  The point of transection of the distal colon was determined to be just proximal to the middle colic, and a second linear cutting stapler was used to divide the colon.  The mesenteric dissection was completed and the specimen was handed off the field.  The two ends were inspected and were healthy.  They were aligned ensuring that the mesentery was not twisted, and a stapled side to side anastomosis was created using a linear cutting stapler and the common enterotomy was closed with a TIA stapler. The staple line was hemostatic. The staple line was oversewn with Lemberted 3-0 Silk, and a crotch stitch was placed at the end of the linear cutting staple line. The mesenteric defect was closed with a 2-0 Chromic gut suture. The bowel was reintroduced into the abdomen, and the abdomen was irrigated.  The omentum was pulled down over the midline.    The entire operative team changed gowns and gloves, and new equipment was used for closing per protocol.  The remaining 11 mm trocar was closed with a 0 Vicryl suture, and the extraction port was closed with 0 PDS suture.  The skin was closed with staples after Xparel was placed.     Final inspection revealed acceptable hemostasis. All counts were correct at the end of the case. The patient was  awakened from anesthesia and extubate without complication.  The patient went to the PACU in stable condition.   Curlene Labrum, MD [facility] [address] [place], [place] [address] [phone] (office)

## 2017-11-11 ENCOUNTER — Other Ambulatory Visit: Payer: Self-pay

## 2017-11-11 ENCOUNTER — Encounter (HOSPITAL_COMMUNITY): Payer: Self-pay | Admitting: General Surgery

## 2017-11-11 LAB — CBC WITH DIFFERENTIAL/PLATELET
BASOS ABS: 0 10*3/uL (ref 0.0–0.1)
BASOS PCT: 1 %
Eosinophils Absolute: 0.1 10*3/uL (ref 0.0–0.7)
Eosinophils Relative: 1 %
HEMATOCRIT: 24.5 % — AB (ref 39.0–52.0)
Hemoglobin: 7.6 g/dL — ABNORMAL LOW (ref 13.0–17.0)
Lymphocytes Relative: 20 %
Lymphs Abs: 1.4 10*3/uL (ref 0.7–4.0)
MCH: 22.2 pg — ABNORMAL LOW (ref 26.0–34.0)
MCHC: 31 g/dL (ref 30.0–36.0)
MCV: 71.6 fL — AB (ref 78.0–100.0)
MONO ABS: 0.7 10*3/uL (ref 0.1–1.0)
Monocytes Relative: 10 %
Neutro Abs: 4.8 10*3/uL (ref 1.7–7.7)
Neutrophils Relative %: 68 %
PLATELETS: 344 10*3/uL (ref 150–400)
RBC: 3.42 MIL/uL — AB (ref 4.22–5.81)
RDW: 14.8 % (ref 11.5–15.5)
WBC: 7 10*3/uL (ref 4.0–10.5)

## 2017-11-11 LAB — BASIC METABOLIC PANEL
ANION GAP: 5 (ref 5–15)
BUN: 5 mg/dL — ABNORMAL LOW (ref 6–20)
CALCIUM: 7.8 mg/dL — AB (ref 8.9–10.3)
CO2: 25 mmol/L (ref 22–32)
Chloride: 98 mmol/L — ABNORMAL LOW (ref 101–111)
Creatinine, Ser: 0.88 mg/dL (ref 0.61–1.24)
GLUCOSE: 98 mg/dL (ref 65–99)
POTASSIUM: 3.9 mmol/L (ref 3.5–5.1)
Sodium: 128 mmol/L — ABNORMAL LOW (ref 135–145)

## 2017-11-11 LAB — PHOSPHORUS: Phosphorus: 2.9 mg/dL (ref 2.5–4.6)

## 2017-11-11 LAB — MAGNESIUM: MAGNESIUM: 1.6 mg/dL — AB (ref 1.7–2.4)

## 2017-11-11 MED ORDER — SIMETHICONE 80 MG PO CHEW
80.0000 mg | CHEWABLE_TABLET | Freq: Four times a day (QID) | ORAL | Status: DC | PRN
  Administered 2017-11-11: 80 mg via ORAL
  Filled 2017-11-11: qty 1

## 2017-11-11 MED ORDER — BISACODYL 10 MG RE SUPP
10.0000 mg | Freq: Once | RECTAL | Status: DC
  Filled 2017-11-11: qty 1

## 2017-11-11 MED ORDER — ALVIMOPAN 12 MG PO CAPS
12.0000 mg | ORAL_CAPSULE | Freq: Two times a day (BID) | ORAL | Status: DC
  Administered 2017-11-11 – 2017-11-13 (×5): 12 mg via ORAL
  Filled 2017-11-11 (×5): qty 1

## 2017-11-11 MED ORDER — MAGNESIUM SULFATE 2 GM/50ML IV SOLN
2.0000 g | Freq: Once | INTRAVENOUS | Status: AC
  Administered 2017-11-11: 2 g via INTRAVENOUS
  Filled 2017-11-11: qty 50

## 2017-11-11 NOTE — Progress Notes (Signed)
[facility] Progress Note  1 Day Post-Op  Subjective: Patient doing well. Feeling good. Sitting up in the chair. Tolerated clears and had BM. Says no pain. Overall feel good. Says he is burping some and belching some but not a lot.   Objective: Vital signs in last 24 hours: Temp:  [98.4 F (36.9 C)] 98.4 F (36.9 C) (06/04 1428) Pulse Rate:  [65-66] 66 (06/04 1428) Resp:  [16-18] 16 (06/04 1428) BP: (100-107)/(56-68) 107/68 (06/04 1428) SpO2:  [98 %-100 %] 100 % (06/04 1428) Last BM Date: 11/09/17  Intake/Output from previous day: 06/03 0701 - 06/04 0700 In: 3902 [P.O.:660; I.V.:3132; IV Piggyback:100] Out: 1000 [Urine:950; Blood:50] Intake/Output this shift: No intake/output data recorded.  General appearance: alert, cooperative and no distress Resp: normal work breathing GI: soft, appropriately tender, dressing in place, c/d/i  Extremities: extremities normal, atraumatic, no cyanosis or edema  Lab Results:  Recent Labs    11/11/17 0520  WBC 7.0  HGB 7.6*  HCT 24.5*  PLT 344   BMET Recent Labs    11/11/17 0520  NA 128*  K 3.9  CL 98*  CO2 25  GLUCOSE 98  BUN 5*  CREATININE 0.88  CALCIUM 7.8*    Anti-infectives: Anti-infectives (From admission, onward)   Start     Dose/Rate Route Frequency Ordered Stop   11/10/17 2200  cefoTEtan in Dextrose 5% (CEFOTAN) IVPB 2 g  Status:  Discontinued     2 g 100 mL/hr over 30 Minutes Intravenous Every 12 hours 11/10/17 1310 11/10/17 1353   11/10/17 2200  cefoTEtan (CEFOTAN) 2 g in sodium chloride 0.9 % 100 mL IVPB     2 g 200 mL/hr over 30 Minutes Intravenous Every 12 hours 11/10/17 1354 11/11/17 0914   11/10/17 0700  cefoTEtan in Dextrose 5% (CEFOTAN) IVPB 2 g     2 g Intravenous On call to O.R. 11/10/17 0656 11/10/17 0945      Assessment/Plan: Mr. Samson is a 62 yo POD 1 s/p Lap right hemicolectomy for cancer. Doing well. PRN for pain, scheduled toradol, tylenol/ ERAS IS, OOB< ambulate HD  ok, off monitor Full liquid now, progress slow as patient tolerates, entereg for today, will prob d/c tomorrow when has another BM  H&H slightly down, no signs of bleeding Mg replaced, LR @ 75 off, foley out Cefotetan prophylaxis post op  SCDs, heparin sq    LOS: 1 day    [name] 11/11/2017

## 2017-11-12 LAB — TYPE AND SCREEN
ABO/RH(D): O NEG
Antibody Screen: NEGATIVE
Unit division: 0
Unit division: 0

## 2017-11-12 LAB — CBC WITH DIFFERENTIAL/PLATELET
BASOS ABS: 0.1 10*3/uL (ref 0.0–0.1)
Basophils Relative: 1 %
EOS ABS: 0.3 10*3/uL (ref 0.0–0.7)
Eosinophils Relative: 4 %
HCT: 24.6 % — ABNORMAL LOW (ref 39.0–52.0)
HEMOGLOBIN: 7.5 g/dL — AB (ref 13.0–17.0)
LYMPHS PCT: 18 %
Lymphs Abs: 1.2 10*3/uL (ref 0.7–4.0)
MCH: 21.9 pg — ABNORMAL LOW (ref 26.0–34.0)
MCHC: 30.5 g/dL (ref 30.0–36.0)
MCV: 71.7 fL — ABNORMAL LOW (ref 78.0–100.0)
Monocytes Absolute: 0.6 10*3/uL (ref 0.1–1.0)
Monocytes Relative: 9 %
Neutro Abs: 4.7 10*3/uL (ref 1.7–7.7)
Neutrophils Relative %: 68 %
Platelets: 355 10*3/uL (ref 150–400)
RBC: 3.43 MIL/uL — AB (ref 4.22–5.81)
RDW: 14.8 % (ref 11.5–15.5)
WBC: 6.9 10*3/uL (ref 4.0–10.5)

## 2017-11-12 LAB — BPAM RBC
BLOOD PRODUCT EXPIRATION DATE: 201906092359
Blood Product Expiration Date: 201906072359
ISSUE DATE / TIME: 201906051405
ISSUE DATE / TIME: 201906052238
UNIT TYPE AND RH: 9500
UNIT TYPE AND RH: 9500

## 2017-11-12 LAB — BASIC METABOLIC PANEL
Anion gap: 6 (ref 5–15)
BUN: 5 mg/dL — ABNORMAL LOW (ref 6–20)
CALCIUM: 8 mg/dL — AB (ref 8.9–10.3)
CHLORIDE: 101 mmol/L (ref 101–111)
CO2: 26 mmol/L (ref 22–32)
CREATININE: 0.75 mg/dL (ref 0.61–1.24)
GFR calc non Af Amer: >60 mL/min (ref >60)
Glucose, Bld: 102 mg/dL — ABNORMAL HIGH (ref 65–99)
Potassium: 3.8 mmol/L (ref 3.5–5.1)
SODIUM: 133 mmol/L — AB (ref 135–145)

## 2017-11-12 LAB — MAGNESIUM: MAGNESIUM: 1.9 mg/dL (ref 1.7–2.4)

## 2017-11-12 LAB — PHOSPHORUS: PHOSPHORUS: 2.2 mg/dL — AB (ref 2.5–4.6)

## 2017-11-12 MED ORDER — K PHOS MONO-SOD PHOS DI & MONO 155-852-130 MG PO TABS
250.0000 mg | ORAL_TABLET | Freq: Two times a day (BID) | ORAL | Status: AC
  Administered 2017-11-12 (×2): 250 mg via ORAL
  Filled 2017-11-12 (×2): qty 1

## 2017-11-12 NOTE — Progress Notes (Signed)
[facility] Progress Note  2 Days Post-Op  Subjective: No issues. Sitting in chair. Looking good. Says having less burping and more flatus. No BM since yesterday. Tolerating fulls.   Objective: Vital signs in last 24 hours: Temp:  [97.8 F (36.6 C)-98.4 F (36.9 C)] 97.8 F (36.6 C) (06/04 2211) Pulse Rate:  [66-67] 67 (06/04 2211) Resp:  [16] 16 (06/04 2211) BP: (107-119)/(66-68) 119/66 (06/04 2211) SpO2:  [100 %] 100 % (06/04 2211) Last BM Date: 11/11/17  Intake/Output from previous day: 06/04 0701 - 06/05 0700 In: 720 [P.O.:720] Out: 1750 [Urine:1750] Intake/Output this shift: No intake/output data recorded.  General appearance: alert, cooperative and no distress Resp: normal work breathing GI: soft, minimally distended, appropriately tender, honeycomb dressing in place, no erythema or drainage Extremities: slight edema in bilateral lowers  Lab Results:  Recent Labs    11/11/17 0520 11/12/17 0534  WBC 7.0 6.9  HGB 7.6* 7.5*  HCT 24.5* 24.6*  PLT 344 355   BMET Recent Labs    11/11/17 0520 11/12/17 0534  NA 128* 133*  K 3.9 3.8  CL 98* 101  CO2 25 26  GLUCOSE 98 102*  BUN 5* 5*  CREATININE 0.88 0.75  CALCIUM 7.8* 8.0*   Assessment/Plan: Jack Gibbs is a 62 yo POD 2 s/p Lap right hemicolectomy for cancer.  PRN for pain, using minimal IS, Ambulate HD good Full liquid adv with another BM, will leave on entereg until another BM  H&H stable, no signs of bleeding, d/c CBC Phos replaced SCDs, heparin sq   Possibly home as soon as tomorrow pending more BMs, tolerating diet, ambulating.   LOS: 2 days    [name] 11/12/2017

## 2017-11-13 LAB — BASIC METABOLIC PANEL
ANION GAP: 4 — AB (ref 5–15)
BUN: <5 mg/dL — ABNORMAL LOW (ref 6–20)
CHLORIDE: 104 mmol/L (ref 101–111)
CO2: 28 mmol/L (ref 22–32)
CREATININE: 0.68 mg/dL (ref 0.61–1.24)
Calcium: 8.1 mg/dL — ABNORMAL LOW (ref 8.9–10.3)
GFR calc non Af Amer: >60 mL/min (ref >60)
GLUCOSE: 104 mg/dL — AB (ref 65–99)
Potassium: 3.7 mmol/L (ref 3.5–5.1)
Sodium: 136 mmol/L (ref 135–145)

## 2017-11-13 LAB — PHOSPHORUS: Phosphorus: 2.7 mg/dL (ref 2.5–4.6)

## 2017-11-13 LAB — MAGNESIUM: MAGNESIUM: 1.9 mg/dL (ref 1.7–2.4)

## 2017-11-13 MED ORDER — DOCUSATE SODIUM 100 MG PO CAPS: 100.0000 mg | ORAL_CAPSULE | Freq: Two times a day (BID) | ORAL | 0 refills | Status: DC | PRN

## 2017-11-13 MED ORDER — TRAMADOL HCL 50 MG PO TABS: 50.0000 mg | ORAL_TABLET | Freq: Four times a day (QID) | ORAL | 0 refills | Status: DC | PRN

## 2017-11-13 NOTE — Discharge Instructions (Signed)
Discharge Instructions: Shower per your regular routine. No submerging in the bath.  Take tylenol and ibuprofen as needed for pain control, alternating every 4-6 hours.  Take Tramadol for breakthrough pain. Take colace for constipation related to narcotic pain medication. Do not pick at the staples. Dry dressing over the area daily as needed/ as desired. Diet as tolerated.   Laparoscopic Colectomy, Care After This sheet gives you information about how to care for yourself after your procedure. Your health care provider may also give you more specific instructions. If you have problems or questions, contact your health care provider. What can I expect after the procedure? After your procedure, it is common to have the following:  Pain in your abdomen, especially in the incision areas. You will be given medicine to control the pain.  Tiredness. This is a normal part of the recovery process. Your energy level will return to normal over the next several weeks.  Changes in your bowel movements, such as constipation or needing to go more often. Talk with your health care provider about how to manage this.  Follow these instructions at home: Medicines  Take over-the-counter and prescription medicines only as told by your health care provider.  Do not drive or use heavy machinery while taking prescription pain medicine.  Do not drink alcohol while taking prescription pain medicine.  If you were prescribed an antibiotic medicine, use it as told by your health care provider. Do not stop using the antibiotic even if you start to feel better. Incision care  Follow instructions from your health care provider about how to take care of your incision areas. Make sure you: ? Keep your incisions clean and dry. ? Wash your hands with soap and water before and after applying medicine to the areas, and before and after changing your bandage (dressing). If soap and water are not available, use hand  sanitizer. ? Change your dressing as told by your health care provider. ? Leave stitches (sutures), skin glue, or adhesive strips in place. These skin closures may need to stay in place for 2 weeks or longer. If adhesive strip edges start to loosen and curl up, you may trim the loose edges. Do not remove adhesive strips completely unless your health care provider tells you to do that.  Do not wear tight clothing over the incisions. Tight clothing may rub and irritate the incision areas, which may cause the incisions to open.  Do not take baths, swim, or use a hot tub until your health care provider approves. Ask your health care provider if you can take showers. You may only be allowed to take sponge baths for bathing.  Check your incision area every day for signs of infection. Check for: ? More redness, swelling, or pain. ? More fluid or blood. ? Warmth. ? Pus or a bad smell. Activity  Avoid lifting anything that is heavier than 10 lb (4.5 kg) for 6 weeks or until your health care provider says it is okay.  You may resume normal activities as told by your health care provider. Ask your health care provider what activities are safe for you.  Take rest breaks during the day as needed. Eating and drinking  Follow instructions from your health care provider about what you can eat after surgery.  To prevent or treat constipation while you are taking prescription pain medicine, your health care provider may recommend that you: ? Drink enough fluid to keep your urine clear or pale yellow. ? Take  over-the-counter or prescription medicines. ? Eat foods that are high in fiber, such as fresh fruits and vegetables, whole grains, and beans. ? Limit foods that are high in fat and processed sugars, such as fried and sweet foods. General instructions  Ask your health care provider when you will need an appointment to get your sutures or staples removed.  Keep all follow-up visits as told by your  health care provider. This is important. Contact a health care provider if:  You have more redness, swelling, or pain around your incisions.  You have more fluid or blood coming from the incisions.  Your incisions feel warm to the touch.  You have pus or a bad smell coming from your incisions or your dressing.  You have a fever.  You have an incision that breaks open (edges not staying together) after sutures or staples have been removed. Get help right away if:  You develop a rash.  You have chest pain or difficulty breathing.  You have pain or swelling in your legs.  You feel light-headed or you faint.  Your abdomen swells (becomes distended).  You have nausea or vomiting.  You have blood in your stool (feces). This information is not intended to replace advice given to you by your health care provider. Make sure you discuss any questions you have with your health care provider. Document Released: 12/14/2004 Document Revised: 02/26/2016 Document Reviewed: 02/26/2016 Elsevier Interactive Patient Education  [employer].

## 2017-11-13 NOTE — Progress Notes (Signed)
Dressing to abd dry and intact. Denies nausea and pain after eating regular diet for breakfast.  IV removed and discharge instructions reviewed.  Scripts sent to pharmacy.  Wife to drive home

## 2017-11-13 NOTE — Discharge Summary (Signed)
Physician Discharge Summary  Patient ID: Jack Gibbs MRN: [id number] DOB/AGE: [id number] 63 y.o.  Admit date: 11/10/2017 Discharge date: 11/13/2017  Admission Diagnoses: Colon cancer   Discharge Diagnoses:  Active Problems:   Colon cancer [facility])   Discharged Condition: good  Hospital Course: Jack Gibbs is a 62 yo with ascending colon cancer Stage IIA (T3N0M0) who underwent a laparoscopic right hemicolectomy on 11/10/2017. He has done wonderfully post op, and has been ambulating, tolerating a diet that has been advanced, and having BMs. He has taken very minimal pain meds, and says that he is feeling good and ready to go home.   Consults: None  Significant Diagnostic Studies: None   Treatments: Laparoscopic Right Hemicolectomy   Pathology: T3N0 Diagnosis Colon, segmental resection for tumor - INVASIVE MODERATELY DIFFERENTIATED ADENOCARCINOMA, 6.5 CM, INVOLVING PROXIMAL ASCENDING COLON IN A CIRCUMFERENTIAL MANNER. SEE NOTE. - CARCINOMA INVADES INTO SUBSEROSAL SOFT TISSUE. - ALL SURGICAL RESECTION MARGINS ARE NEGATIVE FOR CARCINOMA. - LYMPHOVASCULAR OR PERINEURAL INVASION IS NOT IDENTIFIED. - SEVENTEEN LYMPH NODES, NEGATIVE FOR CARCINOMA (0/17). 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 - SEE ONCOLOGY TABLE. Diagnosis Note The omentum is adherent to the serosal aspect of tumor but the adhesions are histologically negative for carcinoma. There is no evidence of perforation. Immunohistochemical stains for MMR-related proteins and molecular studies for microsatellite instability are pending and will be reported in an addendum.  Discharge Exam: Blood pressure 109/69, pulse 64, temperature 98.4 F (36.9 C), temperature source Oral, resp. rate 18, height '5\' 7"'  (1.702 m), weight 152 lb (68.9 kg), SpO2 98 %. General appearance: alert, cooperative and no distress Resp: normal work breathing GI: soft, appropriately tender, non distended, staples  c/d/i without erythema or drainage Extremities: extremities normal, atraumatic, no cyanosis or edema  Disposition: Discharge disposition: 01-Home or Self Care       Discharge Instructions    Call MD for:  difficulty breathing, headache or visual disturbances   Complete by:  As directed    Call MD for:  extreme fatigue   Complete by:  As directed    Call MD for:  persistant dizziness or light-headedness   Complete by:  As directed    Call MD for:  persistant nausea and vomiting   Complete by:  As directed    Call MD for:  redness, tenderness, or signs of infection (pain, swelling, redness, odor or green/yellow discharge around incision site)   Complete by:  As directed    Call MD for:  severe uncontrolled pain   Complete by:  As directed    Call MD for:  temperature >100.4   Complete by:  As directed    Diet - low sodium heart healthy   Complete by:  As directed    Increase activity slowly   Complete by:  As directed      Allergies as of 11/13/2017   No Known Allergies     Medication List    STOP taking these medications   metroNIDAZOLE 500 MG tablet Commonly known as:  FLAGYL   neomycin 500 MG tablet Commonly known as:  MYCIFRADIN   polyethylene glycol-electrolytes 420 g solution Commonly known as:  TRILYTE     TAKE these medications   ADVIL 200 MG tablet Generic drug:  ibuprofen Take 200 mg by mouth every 6 (six) hours as needed for headache or moderate pain.   docusate sodium 100 MG capsule Commonly known as:  COLACE Take 1 capsule (100 mg total) by mouth  2 (two) times daily as needed for mild constipation.   MULTIVITAMIN ADULT PO Take 1 tablet by mouth daily.   traMADol 50 MG tablet Commonly known as:  ULTRAM Take 1 tablet (50 mg total) by mouth every 6 (six) hours as needed (mild pain).      Follow-up Information    [name], MD Follow up on 11/20/2017.   Specialty:  General Surgery Why:  staple removal Contact information: [address] [place] [address] [phone]           Signed: [name] 11/13/2017, 12:22 PM

## 2017-11-20 ENCOUNTER — Encounter: Payer: Self-pay | Admitting: General Surgery

## 2017-11-20 ENCOUNTER — Ambulatory Visit (INDEPENDENT_AMBULATORY_CARE_PROVIDER_SITE_OTHER): Payer: Self-pay | Admitting: General Surgery

## 2017-11-20 VITALS — BP 136/66 | HR 61 | Temp 97.6°F | Wt 157.0 lb

## 2017-11-20 DIAGNOSIS — C182 Malignant neoplasm of ascending colon: Secondary | ICD-10-CM

## 2017-11-20 NOTE — Progress Notes (Signed)
[facility] Note   HPI:  62 y.o. Male presents to clinic for post-op follow-up evaluation after a laparoscopic right hemicolectomy. Patient reports he is doing well. He has minimal pain and is starting to move around more and having daily BM.  Review of Systems:  No nausea/vomiting Pain improving No fevers or chills Daily BM All other review of systems: otherwise negative   Pathology: Diagnosis Colon, segmental resection for tumor - INVASIVE MODERATELY DIFFERENTIATED ADENOCARCINOMA, 6.5 CM, INVOLVING PROXIMAL ASCENDING COLON IN A CIRCUMFERENTIAL MANNER. SEE NOTE. - CARCINOMA INVADES INTO SUBSEROSAL SOFT TISSUE. - ALL SURGICAL RESECTION MARGINS ARE NEGATIVE FOR CARCINOMA. - LYMPHOVASCULAR OR PERINEURAL INVASION IS NOT IDENTIFIED. - SEVENTEEN LYMPH NODES, NEGATIVE FOR CARCINOMA (0/17). 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 - SEE ONCOLOGY TABLE.  Vital Signs:  BP 136/66 (BP Location: Left Arm, Patient Position: Sitting, Cuff Size: Normal)   Pulse 61   Temp 97.6 F (36.4 C) (Temporal)   Wt 157 lb (71.2 kg)   BMI 24.59 kg/m    Physical Exam:  Physical Exam  Constitutional: He is oriented to person, place, and time. He appears well-developed and well-nourished.  HENT:  Head: Normocephalic.  Cardiovascular: Normal rate.  Pulmonary/Chest: Effort normal.  Abdominal: Soft. He exhibits no distension. There is no tenderness.  Staple removed, steristrips applied, no erythema or drainage  Musculoskeletal: Normal range of motion. He exhibits no edema.  Neurological: He is alert and oriented to person, place, and time.  Vitals reviewed.   Laboratory studies: None   Imaging:  None    Assessment:  62 y.o. yo Male with colon cancer with Stage IIA 0/ 17 LN and no lymphovascular invasion.  Doing well.  Plan:  - Refer to Oncology to discuss options, may not need chemotherapy but want him to discuss with them and get  appropriate follow up - Follow up as needed  - No heavy lifting > 6-8 weeks after surgery   Future Appointments  Date Time Provider [place]  11/24/2017  1:00 PM [name], MD [facility] None     All of the above recommendations were dis cussed with the patient, and all of patient's questions were answered to his expressed satisfaction.  Curlene Labrum, MD [facility] [address] [place], [place] [address] [phone] (office)

## 2017-11-20 NOTE — Patient Instructions (Signed)
Referral to Oncology for discussion of options.  No heavy lifting for 6 weeks after surgery to prevent hernia. Can drive as long as can slam on the breaks and not taking narcotic pain medication. Diet as tolerated. Activity as tolerated.

## 2017-11-24 ENCOUNTER — Inpatient Hospital Stay (HOSPITAL_COMMUNITY): Payer: Medicare HMO

## 2017-11-24 ENCOUNTER — Inpatient Hospital Stay (HOSPITAL_COMMUNITY): Payer: Medicare HMO | Attending: Hematology | Admitting: Hematology

## 2017-11-24 ENCOUNTER — Encounter (HOSPITAL_COMMUNITY): Payer: Self-pay | Admitting: Hematology

## 2017-11-24 ENCOUNTER — Other Ambulatory Visit: Payer: Self-pay

## 2017-11-24 VITALS — BP 136/79 | HR 65 | Temp 98.6°F | Resp 16 | Ht 67.0 in | Wt 148.1 lb

## 2017-11-24 DIAGNOSIS — M545 Low back pain: Secondary | ICD-10-CM | POA: Insufficient documentation

## 2017-11-24 DIAGNOSIS — D500 Iron deficiency anemia secondary to blood loss (chronic): Secondary | ICD-10-CM | POA: Diagnosis not present

## 2017-11-24 DIAGNOSIS — N2889 Other specified disorders of kidney and ureter: Secondary | ICD-10-CM | POA: Diagnosis not present

## 2017-11-24 DIAGNOSIS — C182 Malignant neoplasm of ascending colon: Secondary | ICD-10-CM | POA: Diagnosis not present

## 2017-11-24 DIAGNOSIS — Z79899 Other long term (current) drug therapy: Secondary | ICD-10-CM | POA: Diagnosis not present

## 2017-11-24 DIAGNOSIS — K219 Gastro-esophageal reflux disease without esophagitis: Secondary | ICD-10-CM | POA: Diagnosis not present

## 2017-11-24 LAB — CBC WITH DIFFERENTIAL/PLATELET
BASOS PCT: 3 %
Basophils Absolute: 0.2 10*3/uL — ABNORMAL HIGH (ref 0.0–0.1)
EOS PCT: 2 %
Eosinophils Absolute: 0.1 10*3/uL (ref 0.0–0.7)
HEMATOCRIT: 26.5 % — AB (ref 39.0–52.0)
HEMOGLOBIN: 7.8 g/dL — AB (ref 13.0–17.0)
LYMPHS PCT: 32 %
Lymphs Abs: 2 10*3/uL (ref 0.7–4.0)
MCH: 21.1 pg — AB (ref 26.0–34.0)
MCHC: 29.4 g/dL — ABNORMAL LOW (ref 30.0–36.0)
MCV: 71.6 fL — AB (ref 78.0–100.0)
MONO ABS: 0.5 10*3/uL (ref 0.1–1.0)
MONOS PCT: 8 %
NEUTROS PCT: 55 %
Neutro Abs: 3.5 10*3/uL (ref 1.7–7.7)
PLATELETS: 359 10*3/uL (ref 150–400)
RBC: 3.7 MIL/uL — AB (ref 4.22–5.81)
RDW: 15.4 % (ref 11.5–15.5)
WBC: 6.3 10*3/uL (ref 4.0–10.5)

## 2017-11-24 LAB — FERRITIN: Ferritin: 8 ng/mL — ABNORMAL LOW (ref 24–336)

## 2017-11-24 LAB — IRON AND TIBC
Iron: 14 ug/dL — ABNORMAL LOW (ref 45–182)
SATURATION RATIOS: 3 % — AB (ref 17.9–39.5)
TIBC: 493 ug/dL — ABNORMAL HIGH (ref 250–450)
UIBC: 479 ug/dL

## 2017-11-24 LAB — VITAMIN B12: VITAMIN B 12: 271 pg/mL (ref 180–914)

## 2017-11-24 LAB — FOLATE: FOLATE: 14.5 ng/mL (ref >5.9)

## 2017-11-24 NOTE — Assessment & Plan Note (Signed)
1.  Stage IIa (pT3pN0) moderately differentiated adenocarcinoma of the ascending colon: MMR preserved, MSI stable - Presentation with severe microcytic hypochromic anemia, colonoscopy on 10/17/2017 shows apple core lesion proximal to the hepatic flexure, biopsy consistent with invasive adenocarcinoma -CT scan of the chest, abdomen and pelvis dated 10/21/2017 shows no liver metastasis, 1.5 cm interpolar right kidney mass of intermediate density - Underwent right hemicolectomy on 11/10/2017, pathology showing pT3, PN 0, negative lymphatic and vascular invasion, 0 out of 17 lymph nodes negative. - I have discussed the results of the CT scan and the pathology report with the patient and his son in detail.  He does not require any adjuvant chemotherapy because of absent lymph node involvement and no high risk features.  I have discussed the surveillance with CEA level and physical exam every 3 months, CT scan of the abdomen and pelvis every 6 months for the first 2 years followed by yearly CT scans and 62-monthvisits.  He will have a colonoscopy 1 year from the last.  2.  Right kidney mass: -Incidental finding on the CT scan, followed by MRI with and without gadolinium which confirmed 1.5 cm cystic and solid mass in the interpolar right kidney.  We will make a referral to urology.  3.  Microcytic anemia: -His last hemoglobin was 7.5.  We will check a CBC today along with ferritin and iron panel.  If he continues to be severely iron deficient, we will give him 2 infusions of Feraheme.  We discussed the side effects of Feraheme including serious allergic reactions.  He is agreeable to this plan.  He tried oral iron and was severely constipated.  Oral iron was stopped prior to colonoscopy.

## 2017-11-24 NOTE — Progress Notes (Signed)
[facility] NOTE  Patient Care Team: Jani Gravel, MD as PCP - General (Internal Medicine) Gala Romney Cristopher Estimable, MD as Consulting Physician (Gastroenterology)  CHIEF COMPLAINTS/PURPOSE OF CONSULTATION:  Newly diagnosed colon cancer.  HISTORY OF PRESENTING ILLNESS:  Jack Gibbs 63 y.o. male is seen in consultation today for further work-up and management of newly diagnosed right-sided colon cancer.  He initially presented with microcytic anemia requiring 2 units of PRBC.  He underwent colonoscopy by Dr. Gala Romney on 10/17/2017 which showed apple core lesion in the ascending colon proximal to the hepatic flexure.  This was biopsied and positive for invasive adenocarcinoma.  A CT scan of the chest, abdomen and pelvis on 10/21/2017 showed no liver metastasis.  There is a 1.5 cm intermediate dense interpolar right kidney mass as incidental finding.  He underwent laparoscopic right hemicolectomy on 11/10/2017.  Pathology showed a T2N0 moderately differentiated adenocarcinoma, margins negative, negative lymphovascular and perineural invasion, 0/17 lymph nodes positive.  Preoperative CEA level was 0.8.  To follow-up on the right kidney mass, and MRI of the abdomen was done.  Patient lives by himself and is independent of ADLs and IADLs.  He has not worked in the past 10 years secondary to back surgery.  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  He lost few pounds since surgery.  Denies any hematuria.  He was a never smoker.  No family history of any malignancy.  MEDICAL HISTORY:  Past Medical History:  Diagnosis Date  . Anemia   . Cancer (HCC)    Ascending colon  . GERD (gastroesophageal reflux disease)   . Hyperlipidemia   . Lower back pain     SURGICAL HISTORY: Past Surgical History:  Procedure Laterality Date  . BACK SURGERY    . BIOPSY  10/17/2017   Procedure: BIOPSY;  Surgeon: Daneil Dolin, MD;  [facility];  Service: Endoscopy;;  . COLON RESECTION  N/A 11/10/2017   Procedure: LAPAROSCOPIC PARTIAL COLECTOMY;  Surgeon: [name], MD;  Location: [facility];  Service: General;  Laterality: N/A;  . COLONOSCOPY N/A 10/17/2017   Procedure: COLONOSCOPY;  Surgeon: Daneil Dolin, MD;  Location: [facility];  Service: Endoscopy;  Laterality: N/A;  2:00pm    SOCIAL HISTORY: Social History   Socioeconomic History  . Marital status: Single    Spouse name: Not on file  . Number of children: Not on file  . Years of education: Not on file  . Highest education level: Not on file  Occupational History  . Not on file  Social Needs  . Financial resource strain: Not on file  . Food insecurity:    Worry: Not on file    Inability: Not on file  . Transportation needs:    Medical: Not on file    Non-medical: Not on file  Tobacco Use  . Smoking status: Never Smoker  . Smokeless tobacco: Former Systems developer    Types: Chew  Substance and Sexual Activity  . Alcohol use: No  . Drug use: No  . Sexual activity: Not on file  Lifestyle  . Physical activity:    Days per week: Not on file    Minutes per session: Not on file  . Stress: Not on file  Relationships  . Social connections:    Talks on phone: Not on file    Gets together: Not on file    Attends religious service: Not on file    Active member of  club or organization: Not on file    Attends meetings of clubs or organizations: Not on file    Relationship status: Not on file  . Intimate partner violence:    Fear of current or ex partner: Not on file    Emotionally abused: Not on file    Physically abused: Not on file    Forced sexual activity: Not on file  Other Topics Concern  . Not on file  Social History Narrative  . Not on file    FAMILY HISTORY: Family History  Problem Relation Age of Onset  . Stroke Mother   . Alcohol abuse Father   . Down syndrome Sister   . Alcohol abuse Brother   . Colon cancer Neg Hx   . Ulcers Neg Hx     ALLERGIES:  has No Known  Allergies.  MEDICATIONS:  Current Outpatient Medications  Medication Sig Dispense Refill  . ibuprofen (ADVIL) 200 MG tablet Take 200 mg by mouth every 6 (six) hours as needed for headache or moderate pain.     Marland Kitchen docusate sodium (COLACE) 100 MG capsule Take 1 capsule (100 mg total) by mouth 2 (two) times daily as needed for mild constipation. (Patient not taking: Reported on 11/20/2017) 60 capsule 0  . Multiple Vitamins-Minerals (MULTIVITAMIN ADULT PO) Take 1 tablet by mouth daily.     No current facility-administered medications for this visit.     REVIEW OF SYSTEMS:   Constitutional: Denies fevers, chills or abnormal night sweats Eyes: Denies blurriness of vision, double vision or watery eyes Ears, nose, mouth, throat, and face: Denies mucositis or sore throat Respiratory: Denies cough, dyspnea or wheezes Cardiovascular: Denies palpitation, chest discomfort or lower extremity swelling Gastrointestinal:  Denies nausea, heartburn or change in bowel habits Skin: Denies abnormal skin rashes Lymphatics: Denies new lymphadenopathy or easy bruising Neurological:Denies numbness, tingling or new weaknesses Behavioral/Psych: Mood is stable, no new changes  All other systems were reviewed with the patient and are negative.  PHYSICAL EXAMINATION: ECOG PERFORMANCE STATUS: 1 - Symptomatic but completely ambulatory  I have reviewed his vitals.  Blood pressure is 136/79.  Pulse rate is 65.  Respiratory rate is 16.  Temperature is 98.6. GENERAL:alert, no distress and comfortable SKIN: skin color, texture, turgor are normal, no rashes or significant lesions EYES: normal, conjunctiva are pink and non-injected, sclera clear OROPHARYNX:no exudate, no erythema and lips, buccal mucosa, and tongue normal  NECK: supple, thyroid normal size, non-tender, without nodularity LYMPH:  no palpable lymphadenopathy in the cervical, axillary or inguinal LUNGS: clear to auscultation and percussion with normal  breathing effort HEART: regular rate & rhythm and no murmurs and no lower extremity edema ABDOMEN:abdomen soft, non-tender and normal bowel sounds.  Midline wound is well-healed. Musculoskeletal:no cyanosis of digits and no clubbing  PSYCH: alert & oriented x 3 with fluent speech NEURO: no focal motor/sensory deficits  LABORATORY DATA:  I have reviewed the data as listed Lab Results  Component Value Date   WBC 6.9 11/12/2017   HGB 7.5 (L) 11/12/2017   HCT 24.6 (L) 11/12/2017   MCV 71.7 (L) 11/12/2017   PLT 355 11/12/2017     Chemistry      Component Value Date/Time   NA 136 11/13/2017 0516   K 3.7 11/13/2017 0516   CL 104 11/13/2017 0516   CO2 28 11/13/2017 0516   BUN <5 (L) 11/13/2017 0516   CREATININE 0.68 11/13/2017 0516      Component Value Date/Time   CALCIUM 8.1 (L)  11/13/2017 0516   ALKPHOS 88 10/17/2017 0900   AST 12 (L) 10/17/2017 0900   ALT 11 (L) 10/17/2017 0900   BILITOT 0.7 10/17/2017 0900       RADIOGRAPHIC STUDIES: I have personally reviewed the radiological images as listed and agreed with the findings in the report. Jack Gibbs Abdomen W Wo Contrast  Result Date: 10/29/2017 CLINICAL DATA:  Followup abnormal CT. Mass in ascending colon and right kidney. EXAM: MRI ABDOMEN WITHOUT AND WITH CONTRAST TECHNIQUE: Multiplanar multisequence Jack Gibbs imaging of the abdomen was performed both before and after the administration of intravenous contrast. CONTRAST:  6m MULTIHANCE GADOBENATE DIMEGLUMINE 529 MG/ML IV SOLN COMPARISON:  10/21/2017 FINDINGS: Lower chest: No acute findings. Hepatobiliary: Tiny cysts noted within segment 7 of the liver measuring 4 mm. No suspicious enhancing liver abnormalities identified. Gallbladder sludge noted. No wall thickening or biliary dilatation. Pancreas: No mass, inflammatory changes, or other parenchymal abnormality identified. Spleen: The spleen measures 9.6 by 5.0 by 13.1 cm (volume = 330 cm^3). Adrenals/Urinary Tract: Normal adrenal glands.  Mix signal intensity structure arising from the posterior cortex of the interpolar right kidney measures 1.5 cm, image 40/20. This appears to consist of cystic and enhancing solid components. No additional suspicious kidney abnormalities noted. No hydronephrosis identified. Stomach/Bowel: The stomach in the visualized small bowel loops are unremarkable. Enhancing circumferential mass involving the right colon is again noted. Adjacent stranding within the omental fat may represent tumor invasion. No evidence for bowel obstruction at this time. Vascular/Lymphatic: Aortic atherosclerosis. No aneurysm. No adenopathy identified. Other:  No free fluid or fluid collections identified. Musculoskeletal: No suspicious bone lesion. IMPRESSION: 1. 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 2. Ascending colon mass as described previously. Cannot rule out direct tumor invasion of the adjacent momentum. 3. No abdominal adenopathy and no suspicious enhancing liver lesions identified to suggest metastatic disease. 4.  Aortic Atherosclerosis (ICD10-I70.0). Electronically Signed   By: TKerby MoorsM.D.   On: 10/29/2017 11:11    ASSESSMENT & PLAN:  [place].  Stage IIa (pT3pN0) moderately differentiated adenocarcinoma of the ascending colon: MMR preserved, MSI stable - Presentation with severe microcytic hypochromic anemia, colonoscopy on 10/17/2017 shows apple core lesion proximal to the hepatic flexure, biopsy consistent with invasive adenocarcinoma -CT scan of the chest, abdomen and pelvis dated 10/21/2017 shows no liver metastasis, 1.5 cm interpolar right kidney mass of intermediate density - Underwent right hemicolectomy on 11/10/2017, pathology showing pT3, PN 0, negative lymphatic and vascular invasion, 0 out of 17 lymph nodes negative. - I have discussed the results of the CT  scan and the pathology report with the patient and his son in detail.  He does not require any adjuvant chemotherapy because of absent lymph node involvement and no high risk features.  I have discussed the surveillance with CEA level and physical exam every 3 months, CT scan of the abdomen and pelvis every 6 months for the first 2 years followed by yearly CT scans and [id number].  He will have a colonoscopy 1 year from the last.  2.  Right kidney mass: -Incidental finding on the CT scan, followed by MRI with and without gadolinium which confirmed 1.5 cm cystic and solid mass in the interpolar right kidney.  We will make a referral to urology.  3.  Microcytic anemia: -His last hemoglobin was 7.5.  We will check a  CBC today along with ferritin and iron panel.  If he continues to be severely iron deficient, we will give him 2 infusions of Feraheme.  We discussed the side effects of Feraheme including serious allergic reactions.  He is agreeable to this plan.  He tried oral iron and was severely constipated.  Oral iron was stopped prior to colonoscopy.  Orders Placed This Encounter  Procedures  . CBC with Differential    Standing Status:   Future    Standing Expiration Date:   11/24/2018  . Vitamin B12  . Folate  . Iron and TIBC  . Ferritin  . CBC with Differential (Cancer Center Only)    Standing Status:   Future    Standing Expiration Date:   11/25/2018  . CMP ([facility] only)    Standing Status:   Future    Standing Expiration Date:   11/25/2018  . CEA    Standing Status:   Future    Standing Expiration Date:   11/25/2018    All questions were answered. The patient knows to call the clinic with any problems, questions or concerns.     [name], MD 11/24/2017 1:52 PM

## 2017-11-26 DIAGNOSIS — D500 Iron deficiency anemia secondary to blood loss (chronic): Secondary | ICD-10-CM | POA: Insufficient documentation

## 2017-11-26 NOTE — Addendum Note (Signed)
Addended by: [name] on: 11/26/2017 05:05 PM   Modules accepted: Orders

## 2017-12-01 ENCOUNTER — Encounter (HOSPITAL_COMMUNITY): Payer: Self-pay

## 2017-12-01 ENCOUNTER — Inpatient Hospital Stay (HOSPITAL_COMMUNITY): Payer: Medicare HMO

## 2017-12-01 ENCOUNTER — Other Ambulatory Visit: Payer: Self-pay

## 2017-12-01 VITALS — BP 122/79 | HR 52 | Temp 98.6°F | Resp 16

## 2017-12-01 DIAGNOSIS — C182 Malignant neoplasm of ascending colon: Secondary | ICD-10-CM | POA: Diagnosis not present

## 2017-12-01 DIAGNOSIS — D500 Iron deficiency anemia secondary to blood loss (chronic): Secondary | ICD-10-CM

## 2017-12-01 MED ORDER — SODIUM CHLORIDE 0.9 % IV SOLN
Freq: Once | INTRAVENOUS | Status: AC
  Administered 2017-12-01: 10:00:00 via INTRAVENOUS

## 2017-12-01 MED ORDER — SODIUM CHLORIDE 0.9 % IV SOLN
510.0000 mg | Freq: Once | INTRAVENOUS | Status: AC
  Administered 2017-12-01: 510 mg via INTRAVENOUS
  Filled 2017-12-01: qty 17

## 2017-12-01 NOTE — Progress Notes (Signed)
Feraheme given today per orders. Patient tolerated it well without problems. Vitals stable and discharged home from clinic ambulatory. Follow up as scheduled.  

## 2017-12-01 NOTE — Patient Instructions (Signed)
[facility] at [facility] Discharge Instructions  Feraheme given today. Follow up as scheduled.   Thank you for choosing [facility] at [facility] to provide your oncology and hematology care.  To afford each patient quality time with our provider, please arrive at least 15 minutes before your scheduled appointment time.   If you have a lab appointment with the Cancer Center please come in thru the  Main Entrance and check in at the main information desk  You need to re-schedule your appointment should you arrive 10 or more minutes late.  We strive to give you quality time with our providers, and arriving late affects you and other patients whose appointments are after yours.  Also, if you no show three or more times for appointments you may be dismissed from the clinic at the providers discretion.     Again, thank you for choosing [facility].  Our hope is that these requests will decrease the amount of time that you wait before being seen by our physicians.       _____________________________________________________________  Should you have questions after your visit to [facility], please contact our office at [phone] between the hours of 8:30 a.m. and 4:30 p.m.  Voicemails left after 4:30 p.m. will not be returned until the following business day.  For prescription refill requests, have your pharmacy contact our office.       Resources For Cancer Patients and their Caregivers ? American Cancer Society: Can assist with transportation, wigs, general needs, runs Look Good Feel Better.        1-888-227-6333 ? Cancer Care: Provides financial assistance, online support groups, medication/co-pay assistance.  1-800-813-HOPE (4673) ? [facility] Assists [place] cancer patients and their families through emotional , educational and financial support.  336-427-4357 ? [place]  DSS Where to apply for food stamps, Medicaid and utility assistance. 336-342-1394 ? [facility]: Transportation to medical appointments. 336-347-2287 ? Social Security Administration: May apply for disability if have a Stage IV cancer. 336-342-7796 1-800-772-1213 ? [place] Aging, Disability and Transit Services: Assists with nutrition, care and transit needs. 336-349-2343  Cancer Center Support Programs:   > Cancer Support Group  2nd Tuesday of the month 1pm-2pm, Journey Room   > Creative Journey  3rd Tuesday of the month 1130am-1pm, Journey Room    

## 2017-12-08 ENCOUNTER — Encounter (HOSPITAL_COMMUNITY): Payer: Self-pay

## 2017-12-08 ENCOUNTER — Inpatient Hospital Stay (HOSPITAL_COMMUNITY): Payer: Medicare HMO | Attending: Hematology

## 2017-12-08 VITALS — BP 128/67 | HR 53 | Temp 97.7°F | Resp 18

## 2017-12-08 DIAGNOSIS — Z79899 Other long term (current) drug therapy: Secondary | ICD-10-CM | POA: Insufficient documentation

## 2017-12-08 DIAGNOSIS — D500 Iron deficiency anemia secondary to blood loss (chronic): Secondary | ICD-10-CM | POA: Insufficient documentation

## 2017-12-08 MED ORDER — SODIUM CHLORIDE 0.9 % IV SOLN
510.0000 mg | Freq: Once | INTRAVENOUS | Status: AC
  Administered 2017-12-08: 510 mg via INTRAVENOUS
  Filled 2017-12-08: qty 17

## 2017-12-08 MED ORDER — SODIUM CHLORIDE 0.9% FLUSH
10.0000 mL | INTRAVENOUS | Status: DC | PRN
  Administered 2017-12-08: 10 mL
  Filled 2017-12-08: qty 10

## 2017-12-08 MED ORDER — SODIUM CHLORIDE 0.9 % IV SOLN
Freq: Once | INTRAVENOUS | Status: AC
  Administered 2017-12-08: 12:00:00 via INTRAVENOUS

## 2017-12-08 NOTE — Patient Instructions (Signed)
[facility] at [facility]  Discharge Instructions:  You received an iron infusion today.  _______________________________________________________________  Thank you for choosing [facility] at [facility] to provide your oncology and hematology care.  To afford each patient quality time with our providers, please arrive at least 15 minutes before your scheduled appointment.  You need to re-schedule your appointment if you arrive 10 or more minutes late.  We strive to give you quality time with our providers, and arriving late affects you and other patients whose appointments are after yours.  Also, if you no show three or more times for appointments you may be dismissed from the clinic.  Again, thank you for choosing [facility] at [facility]. Our hope is that these requests will allow you access to exceptional care and in a timely manner. _______________________________________________________________  If you have questions after your visit, please contact our office at [phone] between the hours of 8:30 a.m. and 5:00 p.m. Voicemails left after 4:30 p.m. will not be returned until the following business day. _______________________________________________________________  For prescription refill requests, have your pharmacy contact our office. _______________________________________________________________  Recommendations made by the consultant and any test results will be sent to your referring physician. _______________________________________________________________ 

## 2017-12-08 NOTE — Progress Notes (Signed)
Patient tolerated iron infusion with no complaints voiced.  Good blood return noted before and after administration of iron. Peripheral IV site clean and dry with no bruising or swelling noted at site.  Band aid applied.  VSS with discharge and no s/s of distress noted.  No complaints voiced with discharge.

## 2018-02-17 ENCOUNTER — Inpatient Hospital Stay (HOSPITAL_COMMUNITY): Payer: Medicare HMO | Attending: Hematology

## 2018-02-17 DIAGNOSIS — D509 Iron deficiency anemia, unspecified: Secondary | ICD-10-CM | POA: Diagnosis not present

## 2018-02-17 DIAGNOSIS — E785 Hyperlipidemia, unspecified: Secondary | ICD-10-CM | POA: Diagnosis not present

## 2018-02-17 DIAGNOSIS — K219 Gastro-esophageal reflux disease without esophagitis: Secondary | ICD-10-CM | POA: Diagnosis not present

## 2018-02-17 DIAGNOSIS — N2889 Other specified disorders of kidney and ureter: Secondary | ICD-10-CM | POA: Diagnosis not present

## 2018-02-17 DIAGNOSIS — C182 Malignant neoplasm of ascending colon: Secondary | ICD-10-CM | POA: Diagnosis not present

## 2018-02-17 DIAGNOSIS — Z79899 Other long term (current) drug therapy: Secondary | ICD-10-CM | POA: Insufficient documentation

## 2018-02-17 DIAGNOSIS — D500 Iron deficiency anemia secondary to blood loss (chronic): Secondary | ICD-10-CM

## 2018-02-17 LAB — COMPREHENSIVE METABOLIC PANEL
ALT: 40 U/L (ref 0–44)
AST: 30 U/L (ref 15–41)
Albumin: 4.6 g/dL (ref 3.5–5.0)
Alkaline Phosphatase: 112 U/L (ref 38–126)
Anion gap: 6 (ref 5–15)
BUN: 11 mg/dL (ref 8–23)
CALCIUM: 9.5 mg/dL (ref 8.9–10.3)
CO2: 27 mmol/L (ref 22–32)
CREATININE: 0.9 mg/dL (ref 0.61–1.24)
Chloride: 107 mmol/L (ref 98–111)
GFR calc Af Amer: >60 mL/min (ref >60)
Glucose, Bld: 102 mg/dL — ABNORMAL HIGH (ref 70–99)
Potassium: 4.9 mmol/L (ref 3.5–5.1)
Sodium: 140 mmol/L (ref 135–145)
TOTAL PROTEIN: 7.4 g/dL (ref 6.5–8.1)
Total Bilirubin: 0.6 mg/dL (ref 0.3–1.2)

## 2018-02-17 LAB — CBC WITH DIFFERENTIAL/PLATELET
Basophils Absolute: 0.1 10*3/uL (ref 0.0–0.1)
Basophils Relative: 1 %
Eosinophils Absolute: 0.2 10*3/uL (ref 0.0–0.7)
Eosinophils Relative: 3 %
HEMATOCRIT: 45.7 % (ref 39.0–52.0)
Hemoglobin: 15.4 g/dL (ref 13.0–17.0)
LYMPHS ABS: 3.3 10*3/uL (ref 0.7–4.0)
LYMPHS PCT: 41 %
MCH: 28.1 pg (ref 26.0–34.0)
MCHC: 33.7 g/dL (ref 30.0–36.0)
MCV: 83.4 fL (ref 78.0–100.0)
MONO ABS: 0.6 10*3/uL (ref 0.1–1.0)
Monocytes Relative: 7 %
NEUTROS ABS: 3.9 10*3/uL (ref 1.7–7.7)
Neutrophils Relative %: 48 %
Platelets: 169 10*3/uL (ref 150–400)
RBC: 5.48 MIL/uL (ref 4.22–5.81)
RDW: 19.8 % — ABNORMAL HIGH (ref 11.5–15.5)
WBC: 8.1 10*3/uL (ref 4.0–10.5)

## 2018-02-18 ENCOUNTER — Ambulatory Visit: Payer: Medicare HMO | Admitting: Urology

## 2018-02-18 ENCOUNTER — Other Ambulatory Visit (HOSPITAL_COMMUNITY): Payer: Self-pay

## 2018-02-18 DIAGNOSIS — C641 Malignant neoplasm of right kidney, except renal pelvis: Secondary | ICD-10-CM | POA: Diagnosis not present

## 2018-02-18 LAB — CEA: CEA1: 1.4 ng/mL (ref 0.0–4.7)

## 2018-02-24 ENCOUNTER — Other Ambulatory Visit (HOSPITAL_COMMUNITY): Payer: Medicare HMO

## 2018-02-24 ENCOUNTER — Encounter (HOSPITAL_COMMUNITY): Payer: Self-pay | Admitting: Internal Medicine

## 2018-02-24 ENCOUNTER — Inpatient Hospital Stay (HOSPITAL_BASED_OUTPATIENT_CLINIC_OR_DEPARTMENT_OTHER): Payer: Medicare HMO | Admitting: Internal Medicine

## 2018-02-24 VITALS — BP 151/80 | HR 61 | Temp 98.1°F | Resp 16 | Wt 163.4 lb

## 2018-02-24 DIAGNOSIS — C182 Malignant neoplasm of ascending colon: Secondary | ICD-10-CM | POA: Diagnosis not present

## 2018-02-24 DIAGNOSIS — D500 Iron deficiency anemia secondary to blood loss (chronic): Secondary | ICD-10-CM

## 2018-02-24 DIAGNOSIS — N2889 Other specified disorders of kidney and ureter: Secondary | ICD-10-CM | POA: Diagnosis not present

## 2018-02-24 DIAGNOSIS — K219 Gastro-esophageal reflux disease without esophagitis: Secondary | ICD-10-CM | POA: Diagnosis not present

## 2018-02-24 DIAGNOSIS — E785 Hyperlipidemia, unspecified: Secondary | ICD-10-CM

## 2018-02-24 DIAGNOSIS — Z79899 Other long term (current) drug therapy: Secondary | ICD-10-CM

## 2018-02-24 DIAGNOSIS — D509 Iron deficiency anemia, unspecified: Secondary | ICD-10-CM | POA: Diagnosis not present

## 2018-02-24 NOTE — Progress Notes (Signed)
Diagnosis Iron deficiency anemia due to chronic blood loss - Plan: CBC with Differential/Platelet, Comprehensive metabolic panel, Lactate dehydrogenase, Ferritin, CT CHEST W CONTRAST, CT ABDOMEN PELVIS W CONTRAST, CEA  Malignant neoplasm of ascending colon (HCC) - Plan: CBC with Differential/Platelet, Comprehensive metabolic panel, Lactate dehydrogenase, Ferritin, CT CHEST W CONTRAST, CT ABDOMEN PELVIS W CONTRAST, CEA  Staging Cancer Staging Colon cancer [facility]) Staging form: Colon and Rectum, AJCC 8th Edition - Clinical: Stage IIA (cT3, cN0, cM0) - Signed by [name], MD on 11/24/2017   Assessment and Plan:  1.  Stage IIa (pT3pN0) moderately differentiated adenocarcinoma of the ascending colon: MMR preserved, MSI stable - Presentation with severe microcytic hypochromic anemia, colonoscopy on 10/17/2017 shows apple core lesion proximal to the hepatic flexure, biopsy consistent with invasive adenocarcinoma -CT scan of the chest, abdomen and pelvis dated 10/21/2017 shows no liver metastasis, 1.5 cm interpolar right kidney mass of intermediate density - Underwent right hemicolectomy on 11/10/2017, pathology showing pT3, PN 0, negative lymphatic and vascular invasion, 0 out of 17 lymph nodes negative.  - Pt followed by Dr. Worthy Keeler and was not recommended for adjuvant chemotherapy because of absent lymph node involvement and no high risk features.  Labs done 02/17/2018 reviewed and showed WBC 8.1 HB 15.4 plts 169,000.  Chemistries WNL with K+ 4.9 Cr 0.9 and normal LFTs.  Cea WNL at 1.4.    Pt remains on surveillance with CEA level and physical exam every 3 months.  He is set up for 6 month CT scan of the chest abdomen and pelvis in 05/2018.  Scans are recommended every 6 months for the first 2 years followed by yearly CT scans and 54-monthvisits.  He will have a colonoscopy 1 year from diagnosis.    He will RTC in 05/2018 for follow-up and labs and to go over scans.    2.  Right kidney  mass: -Incidental finding on the CT scan, followed by MRI with and without gadolinium which confirmed 1.5 cm cystic and solid mass in the interpolar right kidney.  Pt was referred to urology.  He reports he was seen and recommended for monitoring.  Will obtain records for review.    3.  Microcytic anemia.  Labs done 02/17/2018 reviewed and showed HB 15.  Awaiting ferritin results.  Pt will be notified if IDA.  HB improved since IV iron given in 12/2017.    30 minutes spent with more than 50% spent in counseling and coordination of care.    Interval History:   Historical data obtained from note dated 11/24/2017.  Stage IIa (pT3pN0) moderately differentiated adenocarcinoma of the ascending colon: MMR preserved, MSI stable - Presentation with severe microcytic hypochromic anemia, colonoscopy on 10/17/2017 shows apple core lesion proximal to the hepatic flexure, biopsy consistent with invasive adenocarcinoma -CT scan of the chest, abdomen and pelvis dated 10/21/2017 shows no liver metastasis, 1.5 cm interpolar right kidney mass of intermediate density - Underwent right hemicolectomy on 11/10/2017, pathology showing pT3, PN 0, negative lymphatic and vascular invasion, 0 out of 17 lymph nodes negative.  - Pt followed by Dr. KWorthy Keelerand was not recommended for adjuvant chemotherapy because of absent lymph node involvement and no high risk features.  Current Status    [facility]   10/28/2017 Initial Diagnosis    [facility]    11/24/2017 Cancer Staging    Staging form: Colon and Rectum, AJCC 8th Edition - Clinical: Stage IIA (cT3, cN0, cM0) - Signed by KDerek Jack MD on  11/24/2017      Problem List Patient Active Problem List   Diagnosis Date Noted  . Iron deficiency anemia due to chronic blood loss [D50.0] 11/26/2017  . Colon cancer ([place]) [C18.9] 10/28/2017  . IDA (iron deficiency anemia) [D50.9] 09/11/2017  . GERD (gastroesophageal reflux disease) [K21.9] 09/11/2017     Past Medical History Past Medical History:  Diagnosis Date  . Anemia   . Cancer (HCC)    Ascending colon  . GERD (gastroesophageal reflux disease)   . Hyperlipidemia   . Lower back pain     Past Surgical History Past Surgical History:  Procedure Laterality Date  . BACK SURGERY    . BIOPSY  10/17/2017   Procedure: BIOPSY;  Surgeon: Daneil Dolin, MD;  [facility];  Service: Endoscopy;;  . COLON RESECTION N/A 11/10/2017   Procedure: LAPAROSCOPIC PARTIAL COLECTOMY;  Surgeon: [name], MD;  Location: [facility];  Service: General;  Laterality: N/A;  . COLONOSCOPY N/A 10/17/2017   Procedure: COLONOSCOPY;  Surgeon: Daneil Dolin, MD;  Location: [facility];  Service: Endoscopy;  Laterality: N/A;  2:00pm    Family History Family History  Problem Relation Age of Onset  . Stroke Mother   . Alcohol abuse Father   . Down syndrome Sister   . Alcohol abuse Brother   . Colon cancer Neg Hx   . Ulcers Neg Hx      Social History  reports that he has never smoked. He quit smokeless tobacco use about 39 years ago.  His smokeless tobacco use included chew. He reports that he does not drink alcohol or use drugs.  Medications  Current Outpatient Medications:  .  docusate sodium (COLACE) 100 MG capsule, Take 1 capsule (100 mg total) by mouth 2 (two) times daily as needed for mild constipation., Disp: 60 capsule, Rfl: 0 .  ibuprofen (ADVIL) 200 MG tablet, Take 200 mg by mouth every 6 (six) hours as needed for headache or moderate pain. , Disp: , Rfl:  .  Multiple Vitamins-Minerals (MULTIVITAMIN ADULT PO), Take 1 tablet by mouth daily., Disp: , Rfl:   Allergies Patient has no known allergies.  Review of Systems Review of Systems - Oncology ROS negative   Physical Exam  Vitals Wt Readings from Last 3 Encounters:  02/24/18 163 lb 6.4 oz (74.1 kg)  11/24/17 148 lb 1.6 oz (67.2 kg)  11/20/17 157 lb (71.2 kg)   Temp Readings from Last 3 Encounters:  02/24/18  98.1 F (36.7 C) (Oral)  12/08/17 97.7 F (36.5 C) (Oral)  12/01/17 98.6 F (37 C) (Oral)   BP Readings from Last 3 Encounters:  02/24/18 (!) 151/80  12/08/17 128/67  12/01/17 122/79   Pulse Readings from Last 3 Encounters:  02/24/18 61  12/08/17 (!) 53  12/01/17 (!) 52    Constitutional: Well-developed, well-nourished, and in no distress.   HENT: Head: Normocephalic and atraumatic.  Mouth/Throat: No oropharyngeal exudate. Mucosa moist. Eyes: Pupils are equal, round, and reactive to light. Conjunctivae are normal. No scleral icterus.  Neck: Normal range of motion. Neck supple. No JVD present.  Cardiovascular: Normal rate, regular rhythm and normal heart sounds.  Exam reveals no gallop and no friction rub.   No murmur heard. Pulmonary/Chest: Effort normal and breath sounds normal. No respiratory distress. No wheezes.No rales.  Abdominal: Soft. Bowel sounds are normal. No distension. There is no tenderness. There is no guarding.  Musculoskeletal: No edema or tenderness.  Lymphadenopathy: No cervical, axillary or supraclavicular  adenopathy.  Neurological: Alert and oriented to person, place, and time. No cranial nerve deficit.  Skin: Skin is warm and dry. No rash noted. No erythema. No pallor.  Psychiatric: Affect and judgment normal.   Labs No visits with results within 3 Day(s) from this visit.  Latest known visit with results is:  Appointment on 02/17/2018  Component Date Value Ref Range Status  . CEA 02/17/2018 1.4  0.0 - 4.7 ng/mL Final   Comment: (NOTE)                             Nonsmokers          <3.9                             Smokers             <5.6 [employer] Electrochemiluminescence Immunoassay (ECLIA) Values obtained with different assay methods or kits cannot be used interchangeably.  Results cannot be interpreted as absolute evidence of the presence or absence of malignant disease. Performed At: [facility] [place],  [place] [id number] Rush Farmer MD [id number]   . WBC 02/17/2018 8.1  4.0 - 10.5 K/uL Final  . RBC 02/17/2018 5.48  4.22 - 5.81 MIL/uL Final  . Hemoglobin 02/17/2018 15.4  13.0 - 17.0 g/dL Final  . HCT 02/17/2018 45.7  39.0 - 52.0 % Final  . MCV 02/17/2018 83.4  78.0 - 100.0 fL Final  . MCH 02/17/2018 28.1  26.0 - 34.0 pg Final  . MCHC 02/17/2018 33.7  30.0 - 36.0 g/dL Final  . RDW 02/17/2018 19.8* 11.5 - 15.5 % Final  . Platelets 02/17/2018 169  150 - 400 K/uL Final   Comment: SPECIMEN CHECKED FOR CLOTS PLATELET COUNT CONFIRMED BY SMEAR   . Neutrophils Relative % 02/17/2018 48  % Final  . Neutro Abs 02/17/2018 3.9  1.7 - 7.7 K/uL Final  . Lymphocytes Relative 02/17/2018 41  % Final  . Lymphs Abs 02/17/2018 3.3  0.7 - 4.0 K/uL Final  . Monocytes Relative 02/17/2018 7  % Final  . Monocytes Absolute 02/17/2018 0.6  0.1 - 1.0 K/uL Final  . Eosinophils Relative 02/17/2018 3  % Final  . Eosinophils Absolute 02/17/2018 0.2  0.0 - 0.7 K/uL Final  . Basophils Relative 02/17/2018 1  % Final  . Basophils Absolute 02/17/2018 0.1  0.0 - 0.1 K/uL Final   Performed at [facility], [address] [place], [place] [address]  . Sodium 02/17/2018 140  135 - 145 mmol/L Final  . Potassium 02/17/2018 4.9  3.5 - 5.1 mmol/L Final  . Chloride 02/17/2018 107  98 - 111 mmol/L Final  . CO2 02/17/2018 27  22 - 32 mmol/L Final  . Glucose, Bld 02/17/2018 102* 70 - 99 mg/dL Final  . BUN 02/17/2018 11  8 - 23 mg/dL Final  . Creatinine, Ser 02/17/2018 0.90  0.61 - 1.24 mg/dL Final  . Calcium 02/17/2018 9.5  8.9 - 10.3 mg/dL Final  . Total Protein 02/17/2018 7.4  6.5 - 8.1 g/dL Final  . Albumin 02/17/2018 4.6  3.5 - 5.0 g/dL Final  . AST 02/17/2018 30  15 - 41 U/L Final  . ALT 02/17/2018 40  0 - 44 U/L Final  . Alkaline Phosphatase 02/17/2018 112  38 - 126 U/L Final  . Total Bilirubin 02/17/2018 0.6  0.3 - 1.2 mg/dL Final  .  GFR calc non Af Amer 02/17/2018 >60  >60 mL/min Final  . GFR calc Af Amer 02/17/2018  >60  >60 mL/min Final   Comment: (NOTE) The eGFR has been calculated using the CKD EPI equation. This calculation has not been validated in all clinical situations. eGFR's persistently <60 mL/min signify possible Chronic Kidney Disease.   Georgiann Hahn gap 02/17/2018 6  5 - 15 Final   Performed at [facility], [address], [place], [place] [address]     Pathology Orders Placed This Encounter  Procedures  . CT CHEST W CONTRAST    Standing Status:   Future    Standing Expiration Date:   02/24/2019    Order Specific Question:   If indicated for the ordered procedure, I authorize the administration of contrast media per Radiology protocol    Answer:   Yes    Order Specific Question:   Preferred imaging location?    Answer:   [facility]    Order Specific Question:   Radiology Contrast Protocol - do NOT remove file path    Answer:   \\[email or web address]  . CT ABDOMEN PELVIS W CONTRAST    Standing Status:   Future    Standing Expiration Date:   02/24/2019    Order Specific Question:   If indicated for the ordered procedure, I authorize the administration of contrast media per Radiology protocol    Answer:   Yes    Order Specific Question:   Preferred imaging location?    Answer:   [facility]    Order Specific Question:   Is Oral Contrast requested for this exam?    Answer:   Yes, Per Radiology protocol    Order Specific Question:   Radiology Contrast Protocol - do NOT remove file path    Answer:   \\[email or web address]  . CBC with Differential/Platelet    Standing Status:   Future    Standing Expiration Date:   02/25/2019  . Comprehensive metabolic panel    Standing Status:   Future    Standing Expiration Date:   02/25/2019  . Lactate dehydrogenase    Standing Status:   Future    Standing Expiration Date:   02/25/2019  . Ferritin    Standing Status:   Future    Standing Expiration Date:   02/25/2019  . CEA    Standing  Status:   Future    Standing Expiration Date:   02/25/2019       Zoila Shutter MD

## 2018-04-23 ENCOUNTER — Ambulatory Visit (HOSPITAL_COMMUNITY): Payer: Medicare HMO

## 2018-04-23 ENCOUNTER — Other Ambulatory Visit (HOSPITAL_COMMUNITY): Payer: Medicare HMO

## 2018-04-27 ENCOUNTER — Inpatient Hospital Stay (HOSPITAL_COMMUNITY): Payer: Medicare HMO | Attending: Hematology

## 2018-04-27 DIAGNOSIS — Z800 Family history of malignant neoplasm of digestive organs: Secondary | ICD-10-CM | POA: Diagnosis not present

## 2018-04-27 DIAGNOSIS — M545 Low back pain: Secondary | ICD-10-CM | POA: Insufficient documentation

## 2018-04-27 DIAGNOSIS — K219 Gastro-esophageal reflux disease without esophagitis: Secondary | ICD-10-CM | POA: Insufficient documentation

## 2018-04-27 DIAGNOSIS — C182 Malignant neoplasm of ascending colon: Secondary | ICD-10-CM

## 2018-04-27 DIAGNOSIS — Z79899 Other long term (current) drug therapy: Secondary | ICD-10-CM | POA: Diagnosis not present

## 2018-04-27 DIAGNOSIS — D500 Iron deficiency anemia secondary to blood loss (chronic): Secondary | ICD-10-CM

## 2018-04-27 DIAGNOSIS — I700 Atherosclerosis of aorta: Secondary | ICD-10-CM | POA: Diagnosis not present

## 2018-04-27 DIAGNOSIS — D509 Iron deficiency anemia, unspecified: Secondary | ICD-10-CM | POA: Insufficient documentation

## 2018-04-27 DIAGNOSIS — N2889 Other specified disorders of kidney and ureter: Secondary | ICD-10-CM | POA: Insufficient documentation

## 2018-04-27 DIAGNOSIS — E785 Hyperlipidemia, unspecified: Secondary | ICD-10-CM | POA: Diagnosis not present

## 2018-04-27 LAB — CBC WITH DIFFERENTIAL/PLATELET
Abs Immature Granulocytes: 0.02 10*3/uL (ref 0.00–0.07)
Basophils Absolute: 0.1 10*3/uL (ref 0.0–0.1)
Basophils Relative: 1 %
EOS ABS: 0.2 10*3/uL (ref 0.0–0.5)
EOS PCT: 3 %
HCT: 44.2 % (ref 39.0–52.0)
Hemoglobin: 14.7 g/dL (ref 13.0–17.0)
Immature Granulocytes: 0 %
Lymphocytes Relative: 38 %
Lymphs Abs: 2.9 10*3/uL (ref 0.7–4.0)
MCH: 29.6 pg (ref 26.0–34.0)
MCHC: 33.3 g/dL (ref 30.0–36.0)
MCV: 88.9 fL (ref 80.0–100.0)
MONO ABS: 0.6 10*3/uL (ref 0.1–1.0)
Monocytes Relative: 8 %
NEUTROS ABS: 3.8 10*3/uL (ref 1.7–7.7)
Neutrophils Relative %: 50 %
PLATELETS: 198 10*3/uL (ref 150–400)
RBC: 4.97 MIL/uL (ref 4.22–5.81)
RDW: 13.2 % (ref 11.5–15.5)
WBC: 7.6 10*3/uL (ref 4.0–10.5)
nRBC: 0 % (ref 0.0–0.2)

## 2018-04-27 LAB — COMPREHENSIVE METABOLIC PANEL
ALT: 24 U/L (ref 0–44)
ANION GAP: 7 (ref 5–15)
AST: 23 U/L (ref 15–41)
Albumin: 4.5 g/dL (ref 3.5–5.0)
Alkaline Phosphatase: 83 U/L (ref 38–126)
BUN: 7 mg/dL — ABNORMAL LOW (ref 8–23)
CHLORIDE: 104 mmol/L (ref 98–111)
CO2: 25 mmol/L (ref 22–32)
Calcium: 9 mg/dL (ref 8.9–10.3)
Creatinine, Ser: 0.84 mg/dL (ref 0.61–1.24)
GFR calc non Af Amer: >60 mL/min (ref >60)
Glucose, Bld: 96 mg/dL (ref 70–99)
Potassium: 3.9 mmol/L (ref 3.5–5.1)
SODIUM: 136 mmol/L (ref 135–145)
Total Bilirubin: 0.9 mg/dL (ref 0.3–1.2)
Total Protein: 7.3 g/dL (ref 6.5–8.1)

## 2018-04-27 LAB — LACTATE DEHYDROGENASE: LDH: 149 U/L (ref 98–192)

## 2018-04-27 LAB — FERRITIN: FERRITIN: 56 ng/mL (ref 24–336)

## 2018-04-28 ENCOUNTER — Ambulatory Visit (HOSPITAL_COMMUNITY)
Admission: RE | Admit: 2018-04-28 | Discharge: 2018-04-28 | Disposition: A | Discharge status: Home / Self Care | Payer: Medicare HMO | Source: Ambulatory Visit | Attending: Internal Medicine | Admitting: Internal Medicine

## 2018-04-28 DIAGNOSIS — Z9049 Acquired absence of other specified parts of digestive tract: Secondary | ICD-10-CM | POA: Insufficient documentation

## 2018-04-28 DIAGNOSIS — D500 Iron deficiency anemia secondary to blood loss (chronic): Secondary | ICD-10-CM | POA: Diagnosis not present

## 2018-04-28 DIAGNOSIS — N289 Disorder of kidney and ureter, unspecified: Secondary | ICD-10-CM | POA: Diagnosis not present

## 2018-04-28 DIAGNOSIS — C182 Malignant neoplasm of ascending colon: Secondary | ICD-10-CM

## 2018-04-28 LAB — CEA: CEA: 1.3 ng/mL (ref 0.0–4.7)

## 2018-04-28 MED ORDER — IOPAMIDOL (ISOVUE-300) INJECTION 61%
100.0000 mL | Freq: Once | INTRAVENOUS | Status: AC | PRN
  Administered 2018-04-28: 100 mL via INTRAVENOUS

## 2018-04-30 ENCOUNTER — Encounter (HOSPITAL_COMMUNITY): Payer: Self-pay | Admitting: Internal Medicine

## 2018-04-30 ENCOUNTER — Inpatient Hospital Stay (HOSPITAL_BASED_OUTPATIENT_CLINIC_OR_DEPARTMENT_OTHER): Payer: Medicare HMO | Admitting: Internal Medicine

## 2018-04-30 ENCOUNTER — Other Ambulatory Visit: Payer: Self-pay

## 2018-04-30 VITALS — BP 148/76 | HR 59 | Temp 97.4°F | Resp 16 | Wt 166.6 lb

## 2018-04-30 DIAGNOSIS — N2889 Other specified disorders of kidney and ureter: Secondary | ICD-10-CM

## 2018-04-30 DIAGNOSIS — E785 Hyperlipidemia, unspecified: Secondary | ICD-10-CM

## 2018-04-30 DIAGNOSIS — C182 Malignant neoplasm of ascending colon: Secondary | ICD-10-CM | POA: Diagnosis not present

## 2018-04-30 DIAGNOSIS — I700 Atherosclerosis of aorta: Secondary | ICD-10-CM

## 2018-04-30 DIAGNOSIS — D509 Iron deficiency anemia, unspecified: Secondary | ICD-10-CM | POA: Diagnosis not present

## 2018-04-30 DIAGNOSIS — M545 Low back pain: Secondary | ICD-10-CM

## 2018-04-30 DIAGNOSIS — K219 Gastro-esophageal reflux disease without esophagitis: Secondary | ICD-10-CM

## 2018-04-30 DIAGNOSIS — Z800 Family history of malignant neoplasm of digestive organs: Secondary | ICD-10-CM

## 2018-04-30 DIAGNOSIS — Z79899 Other long term (current) drug therapy: Secondary | ICD-10-CM

## 2018-04-30 DIAGNOSIS — D500 Iron deficiency anemia secondary to blood loss (chronic): Secondary | ICD-10-CM

## 2018-04-30 NOTE — Progress Notes (Signed)
Diagnosis Malignant neoplasm of ascending colon ([place]) - Plan: CBC with Differential/Platelet, Comprehensive metabolic panel, Lactate dehydrogenase, Ferritin, CEA  Iron deficiency anemia due to chronic blood loss - Plan: CBC with Differential/Platelet, Comprehensive metabolic panel, Lactate dehydrogenase, Ferritin, CEA  Staging Cancer Staging Colon cancer [facility]) Staging form: Colon and Rectum, AJCC 8th Edition - Clinical: Stage IIA (cT3, cN0, cM0) - Signed by [name], MD on 11/24/2017   Assessment and Plan:  1.  Stage IIa (pT3pN0) moderately differentiated adenocarcinoma of the ascending colon: MMR preserved, MSI stable - Presentation with severe microcytic hypochromic anemia, colonoscopy on 10/17/2017 shows apple core lesion proximal to the hepatic flexure, biopsy consistent with invasive adenocarcinoma -CT scan of the chest, abdomen and pelvis dated 10/21/2017 shows no liver metastasis, 1.5 cm interpolar right kidney mass of intermediate density - Underwent right hemicolectomy on 11/10/2017, pathology showing pT3, PN 0, negative lymphatic and vascular invasion, 0 out of 17 lymph nodes negative.  - Pt followed by Dr. Worthy Keeler and was not recommended for adjuvant chemotherapy because of absent lymph node involvement and no high risk features.  Labs done 04/27/2018 reviewed and showed WBC 7.6 HB 14.7 plts 198,000.  Chemistries WNL with K+ 3.9 Cr 0.84 and normal LFTs.  CEA WNL at 1.3.  CT CAP done 04/28/2018 reviewed and showed IMPRESSION: 1. Interval partial right hemicolectomy, without recurrent or metastatic disease. 2. Subtle low-density in the posterior aspect of segment 4, favored to represent focal steatosis. Recommend attention on follow-up. 3. Similar size of a right renal lesion, likely renal cell carcinoma. 4.  Aortic Atherosclerosis (ICD10-I70.0).  Pt is set up for CT scan of the chest abdomen and pelvis in 10/2018 with labs.   He will follow-up at that time to go  over results.  Scans are recommended every 6 months for the first 2 years followed by yearly CT scans and 59-monthvisits.  He will have a colonoscopy 1 year from diagnosis.  Pt referred to GI.    2.  Right kidney mass: -Incidental finding on the CT scan, followed by MRI with and without gadolinium which confirmed 1.5 cm cystic and solid mass in the interpolar right kidney.  Pt was referred to urology.  He reports he was seen and recommended for monitoring.  Renal lesion stable on CT done 04/28/2018.  Pt referred back to urology for ongoing follow-up.    3.  Microcytic anemia.  Labs done 04/27/2018 reviewed and showed HB 14.7 with ferritin of 56.  Labs WNL.  Pt was last treated with IV iron in 12/2017.    25 minutes spent with more than 50% spent in counseling and coordination of care.    Interval History:   Historical data obtained from note dated 11/24/2017.  Stage IIa (pT3pN0) moderately differentiated adenocarcinoma of the ascending colon: MMR preserved, MSI stable - Presentation with severe microcytic hypochromic anemia, colonoscopy on 10/17/2017 shows apple core lesion proximal to the hepatic flexure, biopsy consistent with invasive adenocarcinoma -CT scan of the chest, abdomen and pelvis dated 10/21/2017 shows no liver metastasis, 1.5 cm interpolar right kidney mass of intermediate density - Underwent right hemicolectomy on 11/10/2017, pathology showing pT3, PN 0, negative lymphatic and vascular invasion, 0 out of 17 lymph nodes negative.  - Pt followed by Dr. KWorthy Keelerand was not recommended for adjuvant chemotherapy because of absent lymph node involvement and no high risk features.  Current Status:  Pt seen today for follow-up to go over labs and scans.  He denies any blood in stool  or urine.      Colon cancer (HCC)   10/28/2017 Initial Diagnosis    Colon cancer (HCC)    11/24/2017 Cancer Staging    Staging form: Colon and Rectum, AJCC 8th Edition - Clinical: Stage IIA (cT3, cN0, cM0) -  Signed by Katragadda, Sreedhar, MD on 11/24/2017      Problem List Patient Active Problem List   Diagnosis Date Noted  . Iron deficiency anemia due to chronic blood loss [D50.0] 11/26/2017  . Colon cancer (HCC) [C18.9] 10/28/2017  . IDA (iron deficiency anemia) [D50.9] 09/11/2017  . GERD (gastroesophageal reflux disease) [K21.9] 09/11/2017    Past Medical History Past Medical History:  Diagnosis Date  . Anemia   . Cancer (HCC)    Ascending colon  . GERD (gastroesophageal reflux disease)   . Hyperlipidemia   . Lower back pain     Past Surgical History Past Surgical History:  Procedure Laterality Date  . BACK SURGERY    . BIOPSY  10/17/2017   Procedure: BIOPSY;  Surgeon: Rourk, Robert M, MD;  [facility];  Service: Endoscopy;;  . COLON RESECTION N/A 11/10/2017   Procedure: LAPAROSCOPIC PARTIAL COLECTOMY;  Surgeon: Bridges, Lindsay C, MD;  Location: [facility];  Service: General;  Laterality: N/A;  . COLONOSCOPY N/A 10/17/2017   Procedure: COLONOSCOPY;  Surgeon: Rourk, Robert M, MD;  [facility];  Service: Endoscopy;  Laterality: N/A;  2:00pm    Family History Family History  Problem Relation Age of Onset  . Stroke Mother   . Alcohol abuse Father   . Down syndrome Sister   . Alcohol abuse Brother   . Colon cancer Neg Hx   . Ulcers Neg Hx      Social History  reports that he has never smoked. He quit smokeless tobacco use about 39 years ago.  His smokeless tobacco use included chew. He reports that he does not drink alcohol or use drugs.  Medications  Current Outpatient Medications:  .  docusate sodium (COLACE) 100 MG capsule, Take 1 capsule (100 mg total) by mouth 2 (two) times daily as needed for mild constipation., Disp: 60 capsule, Rfl: 0 .  ibuprofen (ADVIL) 200 MG tablet, Take 200 mg by mouth every 6 (six) hours as needed for headache or moderate pain. , Disp: , Rfl:  .  Multiple Vitamins-Minerals (MULTIVITAMIN ADULT PO), Take 1 tablet by mouth  daily., Disp: , Rfl:   Allergies Patient has no known allergies.  Review of Systems Review of Systems - Oncology ROS negative   Physical Exam  Vitals Wt Readings from Last 3 Encounters:  04/30/18 166 lb 9.6 oz (75.6 kg)  02/24/18 163 lb 6.4 oz (74.1 kg)  11/24/17 148 lb 1.6 oz (67.2 kg)   Temp Readings from Last 3 Encounters:  04/30/18 (!) 97.4 F (36.3 C) (Oral)  02/24/18 98.1 F (36.7 C) (Oral)  12/08/17 97.7 F (36.5 C) (Oral)   BP Readings from Last 3 Encounters:  04/30/18 (!) 148/76  02/24/18 (!) 151/80  12/08/17 128/67   Pulse Readings from Last 3 Encounters:  04/30/18 (!) 59  02/24/18 61  12/08/17 (!) 53   Constitutional: Well-developed, well-nourished, and in no distress.   HENT: Head: Normocephalic and atraumatic.  Mouth/Throat: No oropharyngeal exudate. Mucosa moist. Eyes: Pupils are equal, round, and reactive to light. Conjunctivae are normal. No scleral icterus.  Neck: Normal range of motion. Neck supple. No JVD present.  Cardiovascular: Normal rate, regular rhythm and normal heart sounds.  Exam   reveals no gallop and no friction rub.   No murmur heard. Pulmonary/Chest: Effort normal and breath sounds normal. No respiratory distress. No wheezes.No rales.  Abdominal: Soft. Bowel sounds are normal. No distension. There is no tenderness. There is no guarding.  Musculoskeletal: No edema or tenderness.  Lymphadenopathy: No cervical, axillary or supraclavicular adenopathy.  Neurological: Alert and oriented to person, place, and time. No cranial nerve deficit.  Skin: Skin is warm and dry. No rash noted. No erythema. No pallor.  Psychiatric: Affect and judgment normal.   Labs No visits with results within 3 Day(s) from this visit.  Latest known visit with results is:  Appointment on 04/27/2018  Component Date Value Ref Range Status  . WBC 04/27/2018 7.6  4.0 - 10.5 K/uL Final  . RBC 04/27/2018 4.97  4.22 - 5.81 MIL/uL Final  . Hemoglobin 04/27/2018 14.7   13.0 - 17.0 g/dL Final  . HCT 04/27/2018 44.2  39.0 - 52.0 % Final  . MCV 04/27/2018 88.9  80.0 - 100.0 fL Final  . MCH 04/27/2018 29.6  26.0 - 34.0 pg Final  . MCHC 04/27/2018 33.3  30.0 - 36.0 g/dL Final  . RDW 04/27/2018 13.2  11.5 - 15.5 % Final  . Platelets 04/27/2018 198  150 - 400 K/uL Final  . nRBC 04/27/2018 0.0  0.0 - 0.2 % Final  . Neutrophils Relative % 04/27/2018 50  % Final  . Neutro Abs 04/27/2018 3.8  1.7 - 7.7 K/uL Final  . Lymphocytes Relative 04/27/2018 38  % Final  . Lymphs Abs 04/27/2018 2.9  0.7 - 4.0 K/uL Final  . Monocytes Relative 04/27/2018 8  % Final  . Monocytes Absolute 04/27/2018 0.6  0.1 - 1.0 K/uL Final  . Eosinophils Relative 04/27/2018 3  % Final  . Eosinophils Absolute 04/27/2018 0.2  0.0 - 0.5 K/uL Final  . Basophils Relative 04/27/2018 1  % Final  . Basophils Absolute 04/27/2018 0.1  0.0 - 0.1 K/uL Final  . Immature Granulocytes 04/27/2018 0  % Final  . Abs Immature Granulocytes 04/27/2018 0.02  0.00 - 0.07 K/uL Final   Performed at [facility], [address] [place], [place] [address]  . Sodium 04/27/2018 136  135 - 145 mmol/L Final  . Potassium 04/27/2018 3.9  3.5 - 5.1 mmol/L Final  . Chloride 04/27/2018 104  98 - 111 mmol/L Final  . CO2 04/27/2018 25  22 - 32 mmol/L Final  . Glucose, Bld 04/27/2018 96  70 - 99 mg/dL Final  . BUN 04/27/2018 7* 8 - 23 mg/dL Final  . Creatinine, Ser 04/27/2018 0.84  0.61 - 1.24 mg/dL Final  . Calcium 04/27/2018 9.0  8.9 - 10.3 mg/dL Final  . Total Protein 04/27/2018 7.3  6.5 - 8.1 g/dL Final  . Albumin 04/27/2018 4.5  3.5 - 5.0 g/dL Final  . AST 04/27/2018 23  15 - 41 U/L Final  . ALT 04/27/2018 24  0 - 44 U/L Final  . Alkaline Phosphatase 04/27/2018 83  38 - 126 U/L Final  . Total Bilirubin 04/27/2018 0.9  0.3 - 1.2 mg/dL Final  . GFR calc non Af Amer 04/27/2018 >60  >60 mL/min Final  . GFR calc Af Amer 04/27/2018 >60  >60 mL/min Final   Comment: (NOTE) The eGFR has been calculated using the CKD EPI  equation. This calculation has not been validated in all clinical situations. eGFR's persistently <60 mL/min signify possible Chronic Kidney Disease.   . Anion gap 04/27/2018 7  5 -   15 Final   Performed at [facility], [address] [place], [place] [address]  . LDH 04/27/2018 149  98 - 192 U/L Final   Performed at [facility], [address] [place], [place] [address]  . Ferritin 04/27/2018 56  24 - 336 ng/mL Final   Performed at [facility], [address] [place], [place] [address]  . CEA 04/27/2018 1.3  0.0 - 4.7 ng/mL Final   Comment: (NOTE)                             Nonsmokers          <3.9                             Smokers             <5.6 [employer] Electrochemiluminescence Immunoassay (ECLIA) Values obtained with different assay methods or kits cannot be used interchangeably.  Results cannot be interpreted as absolute evidence of the presence or absence of malignant disease. Performed At: [facility] [address] [place], [place] [id number] Nagendra Sanjai MD Ph:[phone]      Pathology Orders Placed This Encounter  Procedures  . CBC with Differential/Platelet    Standing Status:   Future    Standing Expiration Date:   04/30/2020  . Comprehensive metabolic panel    Standing Status:   Future    Standing Expiration Date:   04/30/2020  . Lactate dehydrogenase    Standing Status:   Future    Standing Expiration Date:   04/30/2020  . Ferritin    Standing Status:   Future    Standing Expiration Date:   04/30/2020  . CEA    Standing Status:   Future    Standing Expiration Date:   04/30/2020       Vetta Higgs MD 

## 2018-05-01 ENCOUNTER — Other Ambulatory Visit (HOSPITAL_COMMUNITY): Payer: Self-pay | Admitting: Internal Medicine

## 2018-07-24 ENCOUNTER — Other Ambulatory Visit (HOSPITAL_COMMUNITY): Payer: Self-pay | Admitting: Urology

## 2018-07-24 ENCOUNTER — Other Ambulatory Visit: Payer: Self-pay | Admitting: Urology

## 2018-07-24 DIAGNOSIS — C641 Malignant neoplasm of right kidney, except renal pelvis: Secondary | ICD-10-CM

## 2018-07-30 ENCOUNTER — Ambulatory Visit (HOSPITAL_COMMUNITY)
Admission: RE | Admit: 2018-07-30 | Discharge: 2018-07-30 | Disposition: A | Discharge status: Home / Self Care | Payer: Medicare HMO | Source: Ambulatory Visit | Attending: Urology | Admitting: Urology

## 2018-07-30 DIAGNOSIS — C641 Malignant neoplasm of right kidney, except renal pelvis: Secondary | ICD-10-CM | POA: Diagnosis present

## 2018-07-30 LAB — POCT I-STAT CREATININE: Creatinine, Ser: 0.9 mg/dL (ref 0.61–1.24)

## 2018-07-30 MED ORDER — GADOBUTROL 1 MMOL/ML IV SOLN
7.0000 mL | Freq: Once | INTRAVENOUS | Status: AC | PRN
  Administered 2018-07-30: 7 mL via INTRAVENOUS

## 2018-08-19 ENCOUNTER — Ambulatory Visit: Payer: Medicare HMO | Admitting: Urology

## 2018-08-19 DIAGNOSIS — C641 Malignant neoplasm of right kidney, except renal pelvis: Secondary | ICD-10-CM | POA: Diagnosis not present

## 2018-10-27 ENCOUNTER — Inpatient Hospital Stay (HOSPITAL_COMMUNITY): Payer: Medicare HMO | Attending: Internal Medicine

## 2018-10-27 ENCOUNTER — Other Ambulatory Visit (HOSPITAL_COMMUNITY): Payer: Medicare HMO

## 2018-10-27 ENCOUNTER — Other Ambulatory Visit: Payer: Self-pay

## 2018-10-27 ENCOUNTER — Ambulatory Visit (HOSPITAL_COMMUNITY)
Admission: RE | Admit: 2018-10-27 | Discharge: 2018-10-27 | Disposition: A | Discharge status: Home / Self Care | Payer: Medicare HMO | Source: Ambulatory Visit | Attending: Internal Medicine | Admitting: Internal Medicine

## 2018-10-27 DIAGNOSIS — D500 Iron deficiency anemia secondary to blood loss (chronic): Secondary | ICD-10-CM

## 2018-10-27 DIAGNOSIS — C182 Malignant neoplasm of ascending colon: Secondary | ICD-10-CM | POA: Diagnosis present

## 2018-10-27 LAB — CBC WITH DIFFERENTIAL/PLATELET
Abs Immature Granulocytes: 0.02 10*3/uL (ref 0.00–0.07)
Basophils Absolute: 0 10*3/uL (ref 0.0–0.1)
Basophils Relative: 1 %
Eosinophils Absolute: 0.2 10*3/uL (ref 0.0–0.5)
Eosinophils Relative: 2 %
HCT: 48.9 % (ref 39.0–52.0)
Hemoglobin: 16.3 g/dL (ref 13.0–17.0)
Immature Granulocytes: 0 %
Lymphocytes Relative: 42 %
Lymphs Abs: 2.9 10*3/uL (ref 0.7–4.0)
MCH: 30.4 pg (ref 26.0–34.0)
MCHC: 33.3 g/dL (ref 30.0–36.0)
MCV: 91.2 fL (ref 80.0–100.0)
Monocytes Absolute: 0.5 10*3/uL (ref 0.1–1.0)
Monocytes Relative: 8 %
Neutro Abs: 3.3 10*3/uL (ref 1.7–7.7)
Neutrophils Relative %: 47 %
Platelets: 190 10*3/uL (ref 150–400)
RBC: 5.36 MIL/uL (ref 4.22–5.81)
RDW: 12.4 % (ref 11.5–15.5)
WBC: 7 10*3/uL (ref 4.0–10.5)
nRBC: 0 % (ref 0.0–0.2)

## 2018-10-27 LAB — COMPREHENSIVE METABOLIC PANEL
ALT: 29 U/L (ref 0–44)
AST: 25 U/L (ref 15–41)
Albumin: 4.7 g/dL (ref 3.5–5.0)
Alkaline Phosphatase: 94 U/L (ref 38–126)
Anion gap: 9 (ref 5–15)
BUN: 10 mg/dL (ref 8–23)
CO2: 26 mmol/L (ref 22–32)
Calcium: 9.5 mg/dL (ref 8.9–10.3)
Chloride: 105 mmol/L (ref 98–111)
Creatinine, Ser: 0.85 mg/dL (ref 0.61–1.24)
GFR calc Af Amer: >60 mL/min (ref >60)
GFR calc non Af Amer: >60 mL/min (ref >60)
Glucose, Bld: 103 mg/dL — ABNORMAL HIGH (ref 70–99)
Potassium: 4.9 mmol/L (ref 3.5–5.1)
Sodium: 140 mmol/L (ref 135–145)
Total Bilirubin: 1.1 mg/dL (ref 0.3–1.2)
Total Protein: 7.1 g/dL (ref 6.5–8.1)

## 2018-10-27 LAB — FERRITIN: Ferritin: 92 ng/mL (ref 24–336)

## 2018-10-27 LAB — LACTATE DEHYDROGENASE: LDH: 155 U/L (ref 98–192)

## 2018-10-27 MED ORDER — IOHEXOL 300 MG/ML  SOLN
100.0000 mL | Freq: Once | INTRAMUSCULAR | Status: AC | PRN
  Administered 2018-10-27: 10:00:00 100 mL via INTRAVENOUS

## 2018-10-28 LAB — CEA: CEA: 1.6 ng/mL (ref 0.0–4.7)

## 2018-11-03 ENCOUNTER — Encounter (HOSPITAL_COMMUNITY): Payer: Self-pay | Admitting: Hematology

## 2018-11-03 ENCOUNTER — Other Ambulatory Visit: Payer: Self-pay

## 2018-11-03 ENCOUNTER — Inpatient Hospital Stay (HOSPITAL_COMMUNITY): Payer: Medicare HMO | Attending: Hematology | Admitting: Hematology

## 2018-11-03 VITALS — BP 152/76 | HR 61 | Temp 98.6°F | Resp 18 | Wt 171.6 lb

## 2018-11-03 DIAGNOSIS — D500 Iron deficiency anemia secondary to blood loss (chronic): Secondary | ICD-10-CM

## 2018-11-03 DIAGNOSIS — C182 Malignant neoplasm of ascending colon: Secondary | ICD-10-CM | POA: Diagnosis present

## 2018-11-03 DIAGNOSIS — N2889 Other specified disorders of kidney and ureter: Secondary | ICD-10-CM | POA: Insufficient documentation

## 2018-11-03 DIAGNOSIS — E785 Hyperlipidemia, unspecified: Secondary | ICD-10-CM | POA: Insufficient documentation

## 2018-11-03 DIAGNOSIS — K219 Gastro-esophageal reflux disease without esophagitis: Secondary | ICD-10-CM | POA: Insufficient documentation

## 2018-11-03 DIAGNOSIS — M545 Low back pain: Secondary | ICD-10-CM | POA: Diagnosis not present

## 2018-11-03 NOTE — Progress Notes (Signed)
[facility] Follow up:    Jack Gravel, MD [place] [address]   DIAGNOSIS: Cancer Staging Colon cancer [facility]) Staging form: Colon and Rectum, AJCC 8th Edition - Clinical: Stage IIA (cT3, cN0, cM0) - Signed by [name], MD on 11/24/2017   SUMMARY OF ONCOLOGIC HISTORY:   Colon cancer ([place])   10/28/2017 Initial Diagnosis    Colon cancer ([place])    11/24/2017 Cancer Staging    Staging form: Colon and Rectum, AJCC 8th Edition - Clinical: Stage IIA (cT3, cN0, cM0) - Signed by [name], MD on 11/24/2017     CURRENT THERAPY: Surveillance   INTERVAL HISTORY: Jack Gibbs 63 y.o. male presents today for follow up with clinical surveillance. He reports overall doing well. He denies any significant fatigue. Denies any abdominal pain or changes in bowel habits. Appetite is stable. No weight loss. He is here to discuss results of most recent scan.    Patient Active Problem List   Diagnosis Date Noted  . Iron deficiency anemia due to chronic blood loss 11/26/2017  . Colon cancer ([place]) 10/28/2017  . IDA (iron deficiency anemia) 09/11/2017  . GERD (gastroesophageal reflux disease) 09/11/2017    has No Known Allergies.  MEDICAL HISTORY: Past Medical History:  Diagnosis Date  . Anemia   . Cancer (HCC)    Ascending colon  . GERD (gastroesophageal reflux disease)   . Hyperlipidemia   . Lower back pain     SURGICAL HISTORY: Past Surgical History:  Procedure Laterality Date  . BACK SURGERY    . BIOPSY  10/17/2017   Procedure: BIOPSY;  Surgeon: Daneil Dolin, MD;  [facility];  Service: Endoscopy;;  . COLON RESECTION N/A 11/10/2017   Procedure: LAPAROSCOPIC PARTIAL COLECTOMY;  Surgeon: [name], MD;  Location: [facility] ORS;  Service: General;  Laterality: N/A;  . COLONOSCOPY N/A 10/17/2017   Procedure: COLONOSCOPY;  Surgeon: Daneil Dolin, MD;  Location[facility];  Service: Endoscopy;   Laterality: N/A;  2:00pm    SOCIAL HISTORY: Social History   Socioeconomic History  . Marital status: Single    Spouse name: Not on file  . Number of children: Not on file  . Years of education: Not on file  . Highest education level: Not on file  Occupational History  . Not on file  Social Needs  . Financial resource strain: Not on file  . Food insecurity:    Worry: Not on file    Inability: Not on file  . Transportation needs:    Medical: Not on file    Non-medical: Not on file  Tobacco Use  . Smoking status: Never Smoker  . Smokeless tobacco: Former Systems developer    Types: Chew  Substance and Sexual Activity  . Alcohol use: No  . Drug use: No  . Sexual activity: Not on file  Lifestyle  . Physical activity:    Days per week: Not on file    Minutes per session: Not on file  . Stress: Not on file  Relationships  . Social connections:    Talks on phone: Not on file    Gets together: Not on file    Attends religious service: Not on file    Active member of club or organization: Not on file    Attends meetings of clubs or organizations: Not on file    Relationship status: Not on file  . Intimate partner violence:  Fear of current or ex partner: Not on file    Emotionally abused: Not on file    Physically abused: Not on file    Forced sexual activity: Not on file  Other Topics Concern  . Not on file  Social History Narrative  . Not on file    FAMILY HISTORY: Family History  Problem Relation Age of Onset  . Stroke Mother   . Alcohol abuse Father   . Down syndrome Sister   . Alcohol abuse Brother   . Colon cancer Neg Hx   . Ulcers Neg Hx     Review of Systems  Constitutional: Negative.   HENT:  Negative.   Eyes: Negative.   Respiratory: Negative.   Cardiovascular: Negative.   Gastrointestinal: Negative.   Endocrine: Negative.   Genitourinary: Negative.    Musculoskeletal: Negative.   Skin: Negative.   Neurological: Negative.   Hematological: Negative.    Psychiatric/Behavioral: Negative.       PHYSICAL EXAMINATION  ECOG PERFORMANCE STATUS: 1 - Symptomatic but completely ambulatory  Vitals:   11/03/18 1454  BP: (!) 152/76  Pulse: 61  Resp: 18  Temp: 98.6 F (37 C)  SpO2: 96%    Physical Exam Vitals signs reviewed.  Constitutional:      Appearance: Normal appearance. He is normal weight.  HENT:     Head: Normocephalic.     Nose: Nose normal.     Mouth/Throat:     Mouth: Mucous membranes are moist.     Pharynx: Oropharynx is clear.  Eyes:     Extraocular Movements: Extraocular movements intact.     Pupils: Pupils are equal, round, and reactive to light.  Neck:     Musculoskeletal: Normal range of motion.  Cardiovascular:     Rate and Rhythm: Normal rate and regular rhythm.     Pulses: Normal pulses.     Heart sounds: Normal heart sounds.  Pulmonary:     Effort: Pulmonary effort is normal.     Breath sounds: Normal breath sounds.  Abdominal:     General: Bowel sounds are normal.     Palpations: Abdomen is soft.  Musculoskeletal: Normal range of motion.  Skin:    General: Skin is warm and dry.  Neurological:     General: No focal deficit present.     Mental Status: He is alert and oriented to person, place, and time.  Psychiatric:        Mood and Affect: Mood normal.        Behavior: Behavior normal.        Thought Content: Thought content normal.        Judgment: Judgment normal.     LABORATORY DATA:  CBC    Component Value Date/Time   WBC 7.0 10/27/2018 0911   RBC 5.36 10/27/2018 0911   HGB 16.3 10/27/2018 0911   HCT 48.9 10/27/2018 0911   PLT 190 10/27/2018 0911   MCV 91.2 10/27/2018 0911   MCH 30.4 10/27/2018 0911   MCHC 33.3 10/27/2018 0911   RDW 12.4 10/27/2018 0911   LYMPHSABS 2.9 10/27/2018 0911   MONOABS 0.5 10/27/2018 0911   EOSABS 0.2 10/27/2018 0911   BASOSABS 0.0 10/27/2018 0911    CMP     Component Value Date/Time   NA 140 10/27/2018 0911   K 4.9 10/27/2018 0911   CL 105  10/27/2018 0911   CO2 26 10/27/2018 0911   GLUCOSE 103 (H) 10/27/2018 0911   BUN 10 10/27/2018 0911  CREATININE 0.85 10/27/2018 0911   CALCIUM 9.5 10/27/2018 0911   PROT 7.1 10/27/2018 0911   ALBUMIN 4.7 10/27/2018 0911   AST 25 10/27/2018 0911   ALT 29 10/27/2018 0911   ALKPHOS 94 10/27/2018 0911   BILITOT 1.1 10/27/2018 0911   GFRNONAA >60 10/27/2018 0911   GFRAA >60 10/27/2018 0911       PENDING LABS:   RADIOGRAPHIC STUDIES:  I reviewed CT scans independently.    ASSESSMENT and THERAPY PLAN:   Colon cancer ([place]) 1.  Stage IIa (pT3pN0) moderately differentiated adenocarcinoma of the ascending colon: MMR preserved, MSI stable - Presentation with severe microcytic hypochromic anemia, colonoscopy on 10/17/2017 shows apple core lesion proximal to the hepatic flexure, biopsy consistent with invasive adenocarcinoma -CT scan of the chest, abdomen and pelvis dated 10/21/2017 shows no liver metastasis, 1.5 cm interpolar right kidney mass of intermediate density - Underwent right hemicolectomy on 11/10/2017, pathology showing pT3, PN 0, negative lymphatic and vascular invasion, 0 out of 17 lymph nodes negative. -  Pt returns today for clinical surveillance. CT CAP was negative for disease. CEA 1.6. Pt overall doing well. We will continue with clinical surveillance with physical exam, labs in 3 mths. Will repeat CT CAP in 6 mths.   2.  Right kidney mass: -Incidental finding on the CT scan, followed by MRI with and without gadolinium which confirmed 1.5 cm cystic and solid mass in the interpolar right kidney.  Pt apparently had follow up with Urology and underwent MRI of abdomen. According to patient kidney mass is thought to be benign.      Orders Placed This Encounter  Procedures  . CBC with Differential  . Comprehensive metabolic panel  . CEA    All questions were answered. The patient knows to call the clinic with any problems, questions or concerns. We can certainly see the  patient much sooner if necessary. This note was electronically signed. [name], MD 11/03/2018

## 2018-11-03 NOTE — Assessment & Plan Note (Addendum)
1.  Stage IIa (pT3pN0) moderately differentiated adenocarcinoma of the ascending colon: MMR preserved, MSI stable - Presentation with severe microcytic hypochromic anemia, colonoscopy on 10/17/2017 shows apple core lesion proximal to the hepatic flexure, biopsy consistent with invasive adenocarcinoma -CT scan of the chest, abdomen and pelvis dated 10/21/2017 shows no liver metastasis, 1.5 cm interpolar right kidney mass of intermediate density - Underwent right hemicolectomy on 11/10/2017, pathology showing pT3, PN 0, negative lymphatic and vascular invasion, 0 out of 17 lymph nodes negative. -  Pt returns today for clinical surveillance. CT CAP was negative for disease. CEA 1.6. Pt overall doing well. We will continue with clinical surveillance with physical exam, labs in 3 mths. Will repeat CT CAP in 6 mths.   2.  Right kidney mass: -Incidental finding on the CT scan, followed by MRI with and without gadolinium which confirmed 1.5 cm cystic and solid mass in the interpolar right kidney.  Pt apparently had follow up with Urology and underwent MRI of abdomen. According to patient kidney mass is thought to be benign.    

## 2019-01-25 ENCOUNTER — Other Ambulatory Visit: Payer: Self-pay | Admitting: Urology

## 2019-01-25 ENCOUNTER — Other Ambulatory Visit (HOSPITAL_COMMUNITY): Payer: Self-pay | Admitting: Urology

## 2019-01-25 DIAGNOSIS — C649 Malignant neoplasm of unspecified kidney, except renal pelvis: Secondary | ICD-10-CM

## 2019-01-26 ENCOUNTER — Other Ambulatory Visit (HOSPITAL_COMMUNITY): Payer: Self-pay | Admitting: *Deleted

## 2019-01-26 DIAGNOSIS — D500 Iron deficiency anemia secondary to blood loss (chronic): Secondary | ICD-10-CM

## 2019-01-26 DIAGNOSIS — C182 Malignant neoplasm of ascending colon: Secondary | ICD-10-CM

## 2019-01-27 ENCOUNTER — Inpatient Hospital Stay (HOSPITAL_COMMUNITY): Payer: Medicare HMO | Attending: Hematology

## 2019-01-27 ENCOUNTER — Encounter (HOSPITAL_COMMUNITY): Payer: Self-pay

## 2019-01-27 ENCOUNTER — Other Ambulatory Visit: Payer: Self-pay

## 2019-01-27 DIAGNOSIS — E785 Hyperlipidemia, unspecified: Secondary | ICD-10-CM | POA: Insufficient documentation

## 2019-01-27 DIAGNOSIS — Z85038 Personal history of other malignant neoplasm of large intestine: Secondary | ICD-10-CM | POA: Insufficient documentation

## 2019-01-27 DIAGNOSIS — N2889 Other specified disorders of kidney and ureter: Secondary | ICD-10-CM | POA: Insufficient documentation

## 2019-01-27 DIAGNOSIS — M545 Low back pain: Secondary | ICD-10-CM | POA: Insufficient documentation

## 2019-01-27 DIAGNOSIS — Z79899 Other long term (current) drug therapy: Secondary | ICD-10-CM | POA: Insufficient documentation

## 2019-01-27 DIAGNOSIS — K219 Gastro-esophageal reflux disease without esophagitis: Secondary | ICD-10-CM | POA: Diagnosis not present

## 2019-01-27 DIAGNOSIS — D509 Iron deficiency anemia, unspecified: Secondary | ICD-10-CM | POA: Diagnosis not present

## 2019-01-27 LAB — COMPREHENSIVE METABOLIC PANEL
ALT: 26 U/L (ref 0–44)
AST: 21 U/L (ref 15–41)
Albumin: 4.6 g/dL (ref 3.5–5.0)
Alkaline Phosphatase: 76 U/L (ref 38–126)
Anion gap: 11 (ref 5–15)
BUN: 10 mg/dL (ref 8–23)
CO2: 20 mmol/L — ABNORMAL LOW (ref 22–32)
Calcium: 8.9 mg/dL (ref 8.9–10.3)
Chloride: 103 mmol/L (ref 98–111)
Creatinine, Ser: 0.79 mg/dL (ref 0.61–1.24)
GFR calc Af Amer: >60 mL/min (ref >60)
GFR calc non Af Amer: >60 mL/min (ref >60)
Glucose, Bld: 92 mg/dL (ref 70–99)
Potassium: 3.6 mmol/L (ref 3.5–5.1)
Sodium: 134 mmol/L — ABNORMAL LOW (ref 135–145)
Total Bilirubin: 1.1 mg/dL (ref 0.3–1.2)
Total Protein: 7.1 g/dL (ref 6.5–8.1)

## 2019-01-27 LAB — CBC WITH DIFFERENTIAL/PLATELET
Abs Immature Granulocytes: 0.02 10*3/uL (ref 0.00–0.07)
Basophils Absolute: 0.1 10*3/uL (ref 0.0–0.1)
Basophils Relative: 1 %
Eosinophils Absolute: 0.2 10*3/uL (ref 0.0–0.5)
Eosinophils Relative: 3 %
HCT: 47.8 % (ref 39.0–52.0)
Hemoglobin: 16.3 g/dL (ref 13.0–17.0)
Immature Granulocytes: 0 %
Lymphocytes Relative: 37 %
Lymphs Abs: 2.7 10*3/uL (ref 0.7–4.0)
MCH: 30.1 pg (ref 26.0–34.0)
MCHC: 34.1 g/dL (ref 30.0–36.0)
MCV: 88.4 fL (ref 80.0–100.0)
Monocytes Absolute: 0.5 10*3/uL (ref 0.1–1.0)
Monocytes Relative: 7 %
Neutro Abs: 3.7 10*3/uL (ref 1.7–7.7)
Neutrophils Relative %: 52 %
Platelets: 189 10*3/uL (ref 150–400)
RBC: 5.41 MIL/uL (ref 4.22–5.81)
RDW: 12.5 % (ref 11.5–15.5)
WBC: 7.2 10*3/uL (ref 4.0–10.5)
nRBC: 0 % (ref 0.0–0.2)

## 2019-01-27 LAB — LACTATE DEHYDROGENASE: LDH: 142 U/L (ref 98–192)

## 2019-01-27 LAB — FERRITIN: Ferritin: 113 ng/mL (ref 24–336)

## 2019-01-28 LAB — CEA: CEA: 1.4 ng/mL (ref 0.0–4.7)

## 2019-02-03 ENCOUNTER — Encounter (HOSPITAL_COMMUNITY): Payer: Self-pay | Admitting: Hematology

## 2019-02-03 ENCOUNTER — Inpatient Hospital Stay (HOSPITAL_BASED_OUTPATIENT_CLINIC_OR_DEPARTMENT_OTHER): Payer: Medicare HMO | Admitting: Hematology

## 2019-02-03 ENCOUNTER — Other Ambulatory Visit: Payer: Self-pay

## 2019-02-03 VITALS — BP 130/68 | HR 61 | Temp 97.1°F | Resp 18 | Wt 170.0 lb

## 2019-02-03 DIAGNOSIS — C182 Malignant neoplasm of ascending colon: Secondary | ICD-10-CM

## 2019-02-03 DIAGNOSIS — Z85038 Personal history of other malignant neoplasm of large intestine: Secondary | ICD-10-CM | POA: Diagnosis not present

## 2019-02-03 NOTE — Progress Notes (Signed)
[address] [place], [place] [address]   CLINIC:  Medical Oncology/Hematology  PCP:  Jani Gravel, MD [place] [address] [phone]   REASON FOR VISIT:  Follow-up for colon cancer, right kidney mass and iron deficiency state.  CURRENT THERAPY: Close observation.  BRIEF ONCOLOGIC HISTORY:  Oncology History  Colon cancer ([place])  10/28/2017 Initial Diagnosis   Colon cancer ([place])   11/24/2017 Cancer Staging   Staging form: Colon and Rectum, AJCC 8th Edition - Clinical: Stage IIA (cT3, cN0, cM0) - Signed by [name], MD on 11/24/2017      CANCER STAGING: Cancer Staging Colon cancer [facility]) Staging form: Colon and Rectum, AJCC 8th Edition - Clinical: Stage IIA (cT3, cN0, cM0) - Signed by [name], MD on 11/24/2017    INTERVAL HISTORY:  Jack Gibbs 63 y.o. male seen for follow-up of colon cancer.  Denies any change in bowel habits.  Denies any bleeding per rectum or melena.  Appetite and energy levels are 100%.  No pain is reported.  Numbness in the legs has been stable.  Denies any recent ER visits or hospitalizations.  No recent blood transfusions reported.  No fevers, night sweats or weight loss reported.    REVIEW OF SYSTEMS:  Review of Systems  Neurological: Positive for numbness.  All other systems reviewed and are negative.    PAST MEDICAL/SURGICAL HISTORY:  Past Medical History:  Diagnosis Date  . Anemia   . Cancer (HCC)    Ascending colon  . GERD (gastroesophageal reflux disease)   . Hyperlipidemia   . Lower back pain    Past Surgical History:  Procedure Laterality Date  . BACK SURGERY    . BIOPSY  10/17/2017   Procedure: BIOPSY;  Surgeon: Daneil Dolin, MD;  [facility];  Service: Endoscopy;;  . COLON RESECTION N/A 11/10/2017   Procedure: LAPAROSCOPIC PARTIAL COLECTOMY;  Surgeon: [name], MD;  Location: [facility] ORS;  Service: General;  Laterality: N/A;   . COLONOSCOPY N/A 10/17/2017   Procedure: COLONOSCOPY;  Surgeon: Daneil Dolin, MD;  Location[facility];  Service: Endoscopy;  Laterality: N/A;  2:00pm     SOCIAL HISTORY:  Social History   Socioeconomic History  . Marital status: Single    Spouse name: Not on file  . Number of children: Not on file  . Years of education: Not on file  . Highest education level: Not on file  Occupational History  . Not on file  Social Needs  . Financial resource strain: Not on file  . Food insecurity    Worry: Not on file    Inability: Not on file  . Transportation needs    Medical: Not on file    Non-medical: Not on file  Tobacco Use  . Smoking status: Never Smoker  . Smokeless tobacco: Former Systems developer    Types: Chew  Substance and Sexual Activity  . Alcohol use: No  . Drug use: No  . Sexual activity: Not on file  Lifestyle  . Physical activity    Days per week: Not on file    Minutes per session: Not on file  . Stress: Not on file  Relationships  . Social Herbalist on phone: Not on file    Gets together: Not on file    Attends religious service: Not on file    Active member of club or organization: Not on file  Attends meetings of clubs or organizations: Not on file    Relationship status: Not on file  . Intimate partner violence    Fear of current or ex partner: Not on file    Emotionally abused: Not on file    Physically abused: Not on file    Forced sexual activity: Not on file  Other Topics Concern  . Not on file  Social History Narrative  . Not on file    FAMILY HISTORY:  Family History  Problem Relation Age of Onset  . Stroke Mother   . Alcohol abuse Father   . Down syndrome Sister   . Alcohol abuse Brother   . Colon cancer Neg Hx   . Ulcers Neg Hx     CURRENT MEDICATIONS:  Outpatient Encounter Medications as of 02/03/2019  Medication Sig  . Multiple Vitamins-Minerals (MULTIVITAMIN ADULT PO) Take 1 tablet by mouth daily.  Marland Kitchen docusate sodium  (COLACE) 100 MG capsule Take 1 capsule (100 mg total) by mouth 2 (two) times daily as needed for mild constipation. (Patient not taking: Reported on 02/03/2019)  . ibuprofen (ADVIL) 200 MG tablet Take 200 mg by mouth every 6 (six) hours as needed for headache or moderate pain.   . [DISCONTINUED] fluticasone (FLONASE) 50 MCG/ACT nasal spray Place 2 sprays into both nostrils as needed.    No facility-administered encounter medications on file as of 02/03/2019.     ALLERGIES:  No Known Allergies   PHYSICAL EXAM:  ECOG Performance status: 1  Vitals:   02/03/19 1449  BP: 130/68  Pulse: 61  Resp: 18  Temp: (!) 97.1 F (36.2 C)  SpO2: 98%   Filed Weights   02/03/19 1449  Weight: 170 lb (77.1 kg)    Physical Exam Vitals signs reviewed.  Constitutional:      Appearance: Normal appearance.  Cardiovascular:     Rate and Rhythm: Normal rate and regular rhythm.     Heart sounds: Normal heart sounds.  Pulmonary:     Effort: Pulmonary effort is normal.     Breath sounds: Normal breath sounds.  Abdominal:     General: There is no distension.     Palpations: Abdomen is soft. There is no mass.  Musculoskeletal:        General: No swelling.  Lymphadenopathy:     Cervical: No cervical adenopathy.  Skin:    General: Skin is warm.  Neurological:     General: No focal deficit present.     Mental Status: He is alert and oriented to person, place, and time.  Psychiatric:        Mood and Affect: Mood normal.        Behavior: Behavior normal.      LABORATORY DATA:  I have reviewed the labs as listed.  CBC    Component Value Date/Time   WBC 7.2 01/27/2019 1200   RBC 5.41 01/27/2019 1200   HGB 16.3 01/27/2019 1200   HCT 47.8 01/27/2019 1200   PLT 189 01/27/2019 1200   MCV 88.4 01/27/2019 1200   MCH 30.1 01/27/2019 1200   MCHC 34.1 01/27/2019 1200   RDW 12.5 01/27/2019 1200   LYMPHSABS 2.7 01/27/2019 1200   MONOABS 0.5 01/27/2019 1200   EOSABS 0.2 01/27/2019 1200   BASOSABS  0.1 01/27/2019 1200   CMP Latest Ref Rng & Units 01/27/2019 10/27/2018 07/30/2018  Glucose 70 - 99 mg/dL 92 103(H) -  BUN 8 - 23 mg/dL 10 10 -  Creatinine 0.61 - 1.24 mg/dL  0.79 0.85 0.90  Sodium 135 - 145 mmol/L 134(L) 140 -  Potassium 3.5 - 5.1 mmol/L 3.6 4.9 -  Chloride 98 - 111 mmol/L 103 105 -  CO2 22 - 32 mmol/L 20(L) 26 -  Calcium 8.9 - 10.3 mg/dL 8.9 9.5 -  Total Protein 6.5 - 8.1 g/dL 7.1 7.1 -  Total Bilirubin 0.3 - 1.2 mg/dL 1.1 1.1 -  Alkaline Phos 38 - 126 U/L 76 94 -  AST 15 - 41 U/L 21 25 -  ALT 0 - 44 U/L 26 29 -       DIAGNOSTIC IMAGING:  I have independently reviewed the scans and discussed with the patient.     ASSESSMENT & PLAN:   Colon cancer ([place]) 1.  Stage IIa (PT3PN0) ascending colon adenocarcinoma: - Right hemicolectomy on 11/10/2017, pathology showing PT3PN0, negative lymphatic and vascular invasion, 0/17 lymph nodes positive, MMR preserved, MSI stable. - Last colonoscopy was on 10/17/2017 showing apple core lesion proximal to the hepatic flexure. - CT CAP on 10/27/2018 shows no evidence of metastatic disease or recurrence. -I reviewed his blood work from 01/27/2019.  CEA was 1.4.  LFTs were within normal limits. - I have recommended 47-monthfollow-up visit with repeat CT AP and CEA level. -We will plan to send him back to Dr. RBuford Dresserfor another colonoscopy.  2.  Right kidney mass: - CT AP on 10/27/2018 shows stable 16 mm enhancing lesion in the right kidney compatible with RCC. - Denies any hematuria.  He follows up with Dr. MAlyson Ingles  He is having an ultrasound done tomorrow.  3.  Iron deficiency state: - Feraheme infusion on 12/01/2017 and 12/08/2017. -Latest ferritin level was 113 on 01/27/2019. -No parenteral iron therapy needed.   Total time spent is 25 minutes with more than 50% of the time spent face-to-face discussing lab results, scan results, surveillance plan, counseling and coordination of care.  Orders placed this encounter:  Orders Placed  This Encounter  Procedures  . CT Abdomen Pelvis W Contrast  . CBC with Differential/Platelet  . Comprehensive metabolic panel  . CEA      SDerek Jack MD [phone]

## 2019-02-03 NOTE — Patient Instructions (Signed)
[facility] Discharge Instructions  You were seen today by Dr. Katragadda. He went over your recent lab results. He will see you back in 3 months for labs and follow up.   Thank you for choosing [facility] at [facility] to provide your oncology and hematology care.  To afford each patient quality time with our provider, please arrive at least 15 minutes before your scheduled appointment time.   If you have a lab appointment with the Cancer Center please come in thru the  Main Entrance and check in at the main information desk  You need to re-schedule your appointment should you arrive 10 or more minutes late.  We strive to give you quality time with our providers, and arriving late affects you and other patients whose appointments are after yours.  Also, if you no show three or more times for appointments you may be dismissed from the clinic at the providers discretion.     Again, thank you for choosing [facility].  Our hope is that these requests will decrease the amount of time that you wait before being seen by our physicians.       _____________________________________________________________  Should you have questions after your visit to [facility], please contact our office at [phone] between the hours of 8:00 a.m. and 4:30 p.m.  Voicemails left after 4:00 p.m. will not be returned until the following business day.  For prescription refill requests, have your pharmacy contact our office and allow 72 hours.    Cancer Center Support Programs:   > Cancer Support Group  2nd Tuesday of the month 1pm-2pm, Journey Room    

## 2019-02-03 NOTE — Assessment & Plan Note (Addendum)
1.  Stage IIa (PT3PN0) ascending colon adenocarcinoma: - Right hemicolectomy on 11/10/2017, pathology showing PT3PN0, negative lymphatic and vascular invasion, 0/17 lymph nodes positive, MMR preserved, MSI stable. - Last colonoscopy was on 10/17/2017 showing apple core lesion proximal to the hepatic flexure. - CT CAP on 10/27/2018 shows no evidence of metastatic disease or recurrence. -I reviewed his blood work from 01/27/2019.  CEA was 1.4.  LFTs were within normal limits. - I have recommended 9-monthfollow-up visit with repeat CT AP and CEA level. -We will plan to send him back to Dr. RBuford Dresserfor another colonoscopy.  2.  Right kidney mass: - CT AP on 10/27/2018 shows stable 16 mm enhancing lesion in the right kidney compatible with RCC. - Denies any hematuria.  He follows up with Dr. MAlyson Ingles  He is having an ultrasound done tomorrow.  3.  Iron deficiency state: - Feraheme infusion on 12/01/2017 and 12/08/2017. -Latest ferritin level was 113 on 01/27/2019. -No parenteral iron therapy needed.

## 2019-02-04 ENCOUNTER — Ambulatory Visit (HOSPITAL_COMMUNITY)
Admission: RE | Admit: 2019-02-04 | Discharge: 2019-02-04 | Disposition: A | Discharge status: Home / Self Care | Payer: Medicare HMO | Source: Ambulatory Visit | Attending: Urology | Admitting: Urology

## 2019-02-04 DIAGNOSIS — C649 Malignant neoplasm of unspecified kidney, except renal pelvis: Secondary | ICD-10-CM

## 2019-02-04 DIAGNOSIS — N2889 Other specified disorders of kidney and ureter: Secondary | ICD-10-CM | POA: Insufficient documentation

## 2019-02-24 ENCOUNTER — Ambulatory Visit (INDEPENDENT_AMBULATORY_CARE_PROVIDER_SITE_OTHER): Payer: Medicare HMO | Admitting: Urology

## 2019-02-24 DIAGNOSIS — C641 Malignant neoplasm of right kidney, except renal pelvis: Secondary | ICD-10-CM

## 2019-04-27 ENCOUNTER — Other Ambulatory Visit (HOSPITAL_COMMUNITY): Payer: Self-pay | Admitting: *Deleted

## 2019-04-27 DIAGNOSIS — D500 Iron deficiency anemia secondary to blood loss (chronic): Secondary | ICD-10-CM

## 2019-04-27 DIAGNOSIS — C182 Malignant neoplasm of ascending colon: Secondary | ICD-10-CM

## 2019-04-28 ENCOUNTER — Ambulatory Visit (HOSPITAL_COMMUNITY)
Admission: RE | Admit: 2019-04-28 | Discharge: 2019-04-28 | Disposition: A | Discharge status: Home / Self Care | Payer: Medicare HMO | Source: Ambulatory Visit | Attending: Nephrology | Admitting: Nephrology

## 2019-04-28 ENCOUNTER — Inpatient Hospital Stay (HOSPITAL_COMMUNITY): Payer: Medicare HMO | Attending: Hematology

## 2019-04-28 ENCOUNTER — Other Ambulatory Visit: Payer: Self-pay

## 2019-04-28 DIAGNOSIS — C182 Malignant neoplasm of ascending colon: Secondary | ICD-10-CM | POA: Insufficient documentation

## 2019-04-28 DIAGNOSIS — D500 Iron deficiency anemia secondary to blood loss (chronic): Secondary | ICD-10-CM

## 2019-04-28 LAB — CBC WITH DIFFERENTIAL/PLATELET
Abs Immature Granulocytes: 0.01 10*3/uL (ref 0.00–0.07)
Basophils Absolute: 0.1 10*3/uL (ref 0.0–0.1)
Basophils Relative: 1 %
Eosinophils Absolute: 0.2 10*3/uL (ref 0.0–0.5)
Eosinophils Relative: 4 %
HCT: 50.1 % (ref 39.0–52.0)
Hemoglobin: 16.9 g/dL (ref 13.0–17.0)
Immature Granulocytes: 0 %
Lymphocytes Relative: 43 %
Lymphs Abs: 2.8 10*3/uL (ref 0.7–4.0)
MCH: 30.3 pg (ref 26.0–34.0)
MCHC: 33.7 g/dL (ref 30.0–36.0)
MCV: 89.8 fL (ref 80.0–100.0)
Monocytes Absolute: 0.5 10*3/uL (ref 0.1–1.0)
Monocytes Relative: 8 %
Neutro Abs: 2.9 10*3/uL (ref 1.7–7.7)
Neutrophils Relative %: 44 %
Platelets: 223 10*3/uL (ref 150–400)
RBC: 5.58 MIL/uL (ref 4.22–5.81)
RDW: 12.4 % (ref 11.5–15.5)
WBC: 6.5 10*3/uL (ref 4.0–10.5)
nRBC: 0 % (ref 0.0–0.2)

## 2019-04-28 LAB — COMPREHENSIVE METABOLIC PANEL
ALT: 19 U/L (ref 0–44)
AST: 21 U/L (ref 15–41)
Albumin: 5.1 g/dL — ABNORMAL HIGH (ref 3.5–5.0)
Alkaline Phosphatase: 75 U/L (ref 38–126)
Anion gap: 11 (ref 5–15)
BUN: 10 mg/dL (ref 8–23)
CO2: 24 mmol/L (ref 22–32)
Calcium: 9.7 mg/dL (ref 8.9–10.3)
Chloride: 101 mmol/L (ref 98–111)
Creatinine, Ser: 0.89 mg/dL (ref 0.61–1.24)
GFR calc Af Amer: >60 mL/min (ref >60)
GFR calc non Af Amer: >60 mL/min (ref >60)
Glucose, Bld: 103 mg/dL — ABNORMAL HIGH (ref 70–99)
Potassium: 4 mmol/L (ref 3.5–5.1)
Sodium: 136 mmol/L (ref 135–145)
Total Bilirubin: 1 mg/dL (ref 0.3–1.2)
Total Protein: 7.6 g/dL (ref 6.5–8.1)

## 2019-04-28 MED ORDER — IOHEXOL 300 MG/ML  SOLN
100.0000 mL | Freq: Once | INTRAMUSCULAR | Status: AC | PRN
  Administered 2019-04-28: 100 mL via INTRAVENOUS

## 2019-04-29 LAB — CEA: CEA: 1.3 ng/mL (ref 0.0–4.7)

## 2019-05-03 ENCOUNTER — Encounter (HOSPITAL_COMMUNITY): Payer: Self-pay | Admitting: Hematology

## 2019-05-03 ENCOUNTER — Other Ambulatory Visit: Payer: Self-pay

## 2019-05-03 ENCOUNTER — Inpatient Hospital Stay (HOSPITAL_BASED_OUTPATIENT_CLINIC_OR_DEPARTMENT_OTHER): Payer: Medicare HMO | Admitting: Hematology

## 2019-05-03 DIAGNOSIS — C182 Malignant neoplasm of ascending colon: Secondary | ICD-10-CM

## 2019-05-03 NOTE — Progress Notes (Signed)
Virtual Visit via Telephone Note  I connected with Jack Gibbs on 05/03/19 at  2:35 PM EST by telephone and verified that I am speaking with the correct person using two identifiers.   I discussed the limitations, risks, security and privacy concerns of performing an evaluation and management service by telephone and the availability of in person appointments. I also discussed with the patient that there may be a patient responsible charge related to this service. The patient expressed understanding and agreed to proceed.   History of Present Illness: Is being followed in the clinic for stage II ascending colon adenocarcinoma, status post right hemicolectomy on 11/10/2017.  Last CT scan on 10/27/2018 did not show any evidence of metastatic disease.  He also has a right kidney mass which has been stable in size.   Observations/Objective: He denies any bleeding per rectum or melena.  Denies any change in bowel habits.  Denies any nausea, vomiting, diarrhea or constipation.  No new onset pains reported.  Denies any hematuria or burning on urination.  No fevers, night sweats or weight loss reported in the last 6 months.  No infections or hospitalizations.  Assessment and Plan:  1.  Stage IIa (PT3PN0) ascending colon adenocarcinoma: -Right hemicolectomy on 11/10/2017, PT3PN0, MSI stable. -Last colonoscopy was on 10/17/2017. -I reviewed results of the CT scan dated 04/28/2019 which showed stable exam with no evidence of recurrent or metastatic disease. -We have also reviewed blood work.  CEA is 1.3.  LFTs are normal. -She will be seen back in 3 months for follow-up with repeat CEA and other labs. -I will make a referral to Dr. Buford Dresser for colonoscopy.  2.  Right kidney mass: -CTAP on 10/27/2018 showed 16 mm enhancing lesion in the right kidney compatible with RCC. -Current CT scan showed 1.2 cm mass which is stable in the posterior aspect of the right kidney. -He denies any hematuria.  He is also  following up with Dr. Alyson Ingles.   Follow Up Instructions: RTC 3 months with labs.  Follow-up with Dr. Buford Dresser for colonoscopy.   I discussed the assessment and treatment plan with the patient. The patient was provided an opportunity to ask questions and all were answered. The patient agreed with the plan and demonstrated an understanding of the instructions.   The patient was advised to call back or seek an in-person evaluation if the symptoms worsen or if the condition fails to improve as anticipated.  I provided 12 minutes of non-face-to-face time during this encounter.   [name], MD

## 2019-05-10 ENCOUNTER — Encounter: Payer: Self-pay | Admitting: Internal Medicine

## 2019-06-08 ENCOUNTER — Encounter: Payer: Self-pay | Admitting: Gastroenterology

## 2019-06-08 NOTE — Progress Notes (Signed)
Referring Provider: Jani Gravel, MD Primary Care Physician:  Jani Gravel, MD Primary GI Physician: Dr. Gala Romney  Chief Complaint  Patient presents with  . Colonoscopy    consult    HPI:   Jack Gibbs is a 63 y.o. male presenting today to schedule surveillance colonoscopy.  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  No neoadjuvant chemotherapy required due to absence of lymph node involvement and no high-risk features.  Last CT scan on 10/27/2018 without evidence of recurrent or metastatic disease.  He does have a right kidney mass which has been stable.  Last saw oncology on 05/03/2019 for follow-up.  Plans to follow-up in 3 months and refer for colonoscopy.   Today:  No GI concerns at this time. No abdominal pain. BM daily to twice a day. No constipation or diarrhea. No blood in the stool. No black stool. No nausea or vomiting. No unintentional weight loss. He is trying to loose weight. Has lost about 5 lbs in 3 months. No reflux or heartburn. No dysphagia.   No complications with last TCS. Tolerated procedure well.   Past Medical History:  Diagnosis Date  . Anemia   . Cancer [facility])    Ascending colon s/p right hemicolectomy on 11/10/2017.   Marland Kitchen GERD (gastroesophageal reflux disease)   . Hyperlipidemia   . Lower back pain     Past Surgical History:  Procedure Laterality Date  . BACK SURGERY    . BIOPSY  10/17/2017   Procedure: BIOPSY;  Surgeon: Daneil Dolin, MD;  Location[facility];  Service: Endoscopy;;  . COLON RESECTION N/A 11/10/2017   Procedure: LAPAROSCOPIC PARTIAL COLECTOMY;  Surgeon: [name], MD;  Location: [facility] ORS;  Service: General;  Laterality: N/A;  . COLONOSCOPY N/A 10/17/2017   Procedure: COLONOSCOPY;  Surgeon: Daneil Dolin, MD; bulky apple core tumor in the vicinity of a sending colon.  Diverticulosis in sigmoid and descending colon.  Pathology with invasive adenocarcinoma.    Current  Outpatient Medications  Medication Sig Dispense Refill  . ibuprofen (ADVIL) 200 MG tablet Take 200 mg by mouth every 6 (six) hours as needed for headache or moderate pain.     . rosuvastatin (CRESTOR) 5 MG tablet Take 5 mg by mouth at bedtime.     No current facility-administered medications for this visit.    Allergies as of 06/09/2019  . (No Known Allergies)    Family History  Problem Relation Age of Onset  . Stroke Mother   . Alcohol abuse Father   . Down syndrome Sister   . Alcohol abuse Brother   . Colon cancer Neg Hx   . Ulcers Neg Hx     Social History   Socioeconomic History  . Marital status: Single    Spouse name: Not on file  . Number of children: Not on file  . Years of education: Not on file  . Highest education level: Not on file  Occupational History  . Not on file  Tobacco Use  . Smoking status: Never Smoker  . Smokeless tobacco: Former Systems developer    Types: Chew  Substance and Sexual Activity  . Alcohol use: No  . Drug use: No  . Sexual activity: Not on file  Other Topics Concern  . Not on file  Social History Narrative  . Not on file   Social Determinants of Health   Financial Resource Strain:   . Difficulty of Paying Living Expenses:  Not on file  Food Insecurity:   . Worried About Charity fundraiser in the Last Year: Not on file  . Ran Out of Food in the Last Year: Not on file  Transportation Needs:   . Lack of Transportation (Medical): Not on file  . Lack of Transportation (Non-Medical): Not on file  Physical Activity:   . Days of Exercise per Week: Not on file  . Minutes of Exercise per Session: Not on file  Stress:   . Feeling of Stress : Not on file  Social Connections:   . Frequency of Communication with Friends and Family: Not on file  . Frequency of Social Gatherings with Friends and Family: Not on file  . Attends Religious Services: Not on file  . Active Member of Clubs or Organizations: Not on file  . Attends Archivist  Meetings: Not on file  . Marital Status: Not on file    Review of Systems: Gen: Denies fever, chills, lightheadedness, dizziness, pre-syncope or syncope.  CV: Denies chest pain or palpitations Resp: Denies dyspnea or cough GI: See HPI Derm: Denies rash Psych: Denies depression, anxiety  Heme: Denies bruising or bleeding  Physical Exam: BP (!) 154/84   Pulse 62   Temp (!) 97 F (36.1 C) (Temporal)   Ht 5\' 10"  (1.778 m)   Wt 165 lb 12.8 oz (75.2 kg)   BMI 23.79 kg/m  General:   Alert and oriented. No distress noted. Pleasant and cooperative.  Head:  Normocephalic and atraumatic. Eyes:  Conjuctiva clear without scleral icterus. Heart:  S1, S2 present without murmurs appreciated. Lungs:  Clear to auscultation bilaterally. No wheezes, rales, or rhonchi. No distress.  Abdomen:  +BS, soft, non-tender and non-distended. No rebound or guarding. No HSM or masses noted. Msk:  Symmetrical without gross deformities. Normal posture. Extremities:  Without edema. Neurologic:  Alert and  oriented x4 Psych: Normal mood and affect.

## 2019-06-09 ENCOUNTER — Encounter: Payer: Self-pay | Admitting: *Deleted

## 2019-06-09 ENCOUNTER — Other Ambulatory Visit: Payer: Self-pay

## 2019-06-09 ENCOUNTER — Ambulatory Visit: Payer: Medicare HMO | Admitting: Gastroenterology

## 2019-06-09 ENCOUNTER — Encounter: Payer: Self-pay | Admitting: Gastroenterology

## 2019-06-09 VITALS — BP 154/84 | HR 62 | Temp 97.0°F | Ht 70.0 in | Wt 165.8 lb

## 2019-06-09 DIAGNOSIS — Z85038 Personal history of other malignant neoplasm of large intestine: Secondary | ICD-10-CM

## 2019-06-09 MED ORDER — NA SULFATE-K SULFATE-MG SULF 17.5-3.13-1.6 GM/177ML PO SOLN: 1.0000 | Freq: Once | ORAL | 0 refills | Status: AC

## 2019-06-09 NOTE — Assessment & Plan Note (Signed)
63 year old male with history of stage II ascending colon adenocarcinoma s/p laparoscopic right hemicolectomy on 11/10/2017.  He is followed closely by oncology and has required no neoadjuvant chemotherapy. Last CT scan on 10/27/2018 without evidence of recurrent or metastatic disease. Currently due for surveillance colonoscopy.  He is without any significant upper or lower GI symptoms at this time.  No alarm symptoms.  No family history of colon cancer.  Proceed with TCS with Dr. Gala Romney in the near future. Proceed with upper endoscopy in the near future with Dr. Gala Romney. The risks, benefits, and alternatives have been discussed in detail with patient. They have stated understanding and desire to proceed.  Follow-up as recommended at the time of colonoscopy.

## 2019-06-09 NOTE — Patient Instructions (Signed)
We will get you scheduled for your colonoscopy in the near future with Dr. Gala Romney.  Follow-up as recommended at the time of colonoscopy.  Call if you have questions or concerns.  Aliene Altes, PA-C [facility]

## 2019-06-10 NOTE — Progress Notes (Signed)
CC'ED TO PCP 

## 2019-06-16 ENCOUNTER — Other Ambulatory Visit: Payer: Self-pay

## 2019-06-16 DIAGNOSIS — C641 Malignant neoplasm of right kidney, except renal pelvis: Secondary | ICD-10-CM

## 2019-06-26 IMAGING — CT CT CHEST WITH CONTRAST
2 of 5 series · 12 of 36 positions shown, 15 images · IV contrast (Isovue)
Comparison: 04/28/2018

CLINICAL DATA: Hemicolectomy right-sided colon cancer. Status post
right

EXAM:
CT CHEST, ABDOMEN, AND PELVIS WITH CONTRAST
TECHNIQUE: Multidetector CT imaging of the chest, abdomen and pelvis was
performed following the standard protocol during bolus
administration of intravenous contrast.
CONTRAST:  100mL OMNIPAQUE IOHEXOL 300 MG/ML  SOLN

[Series 2: cap with · axial · 0.74mm/px · z∈[+945,+1505]mm · 9 of 140 slices shown, 12 images]
[im 14/140  mediastinal]
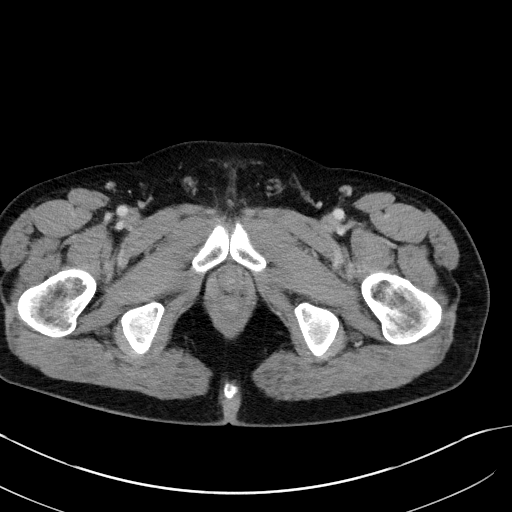
[im 14/140  lung]
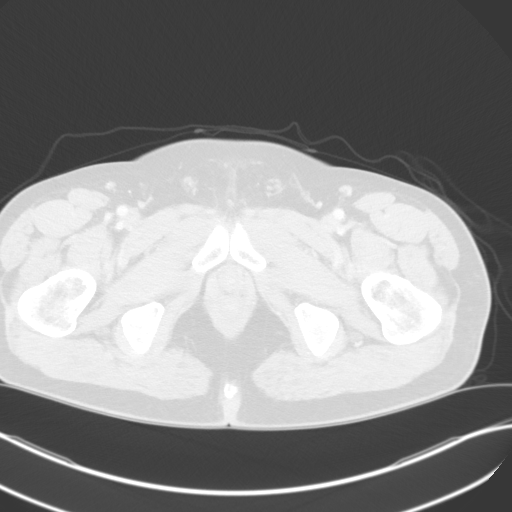
[im 28/140  lung]
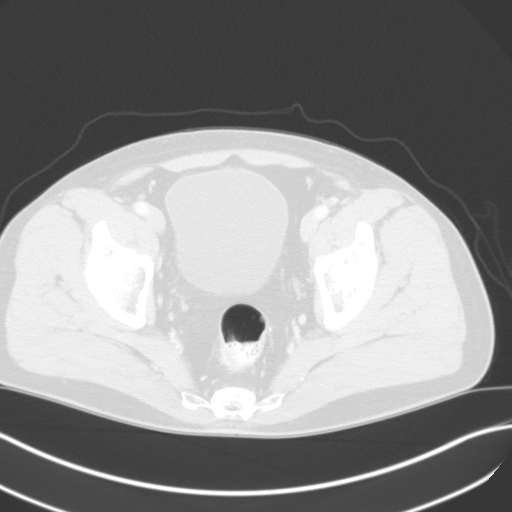
[im 42/140  lung]
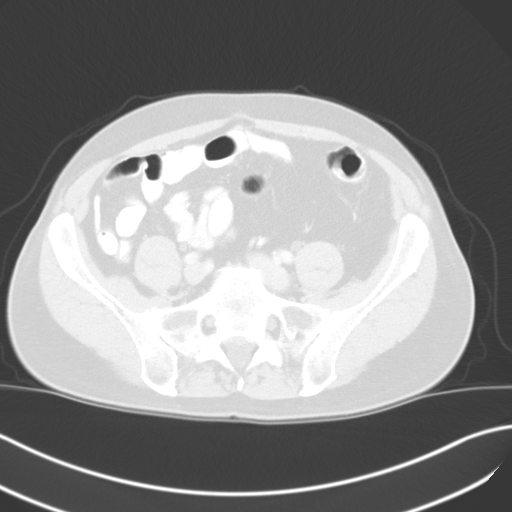
[im 56/140  lung]
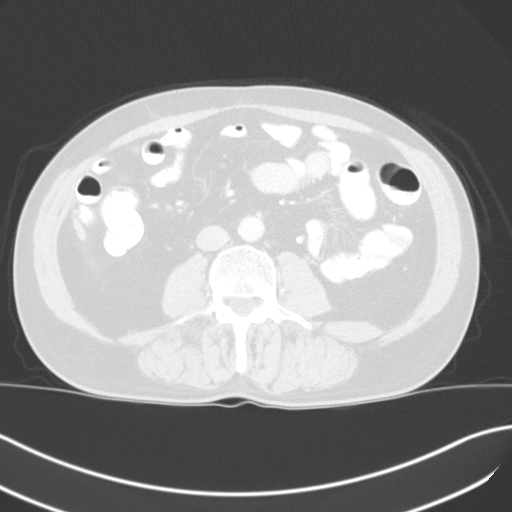
[im 70/140  mediastinal]
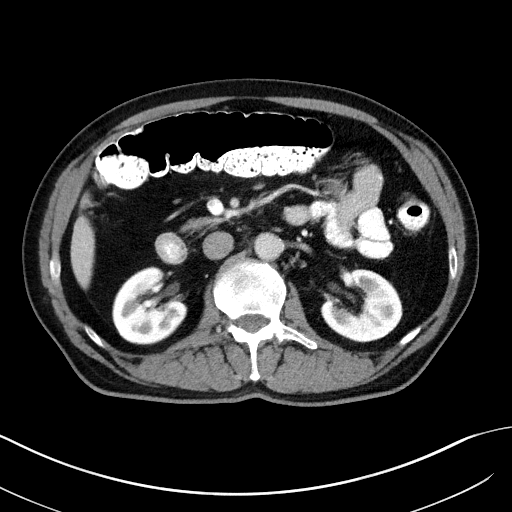
[im 70/140  lung]
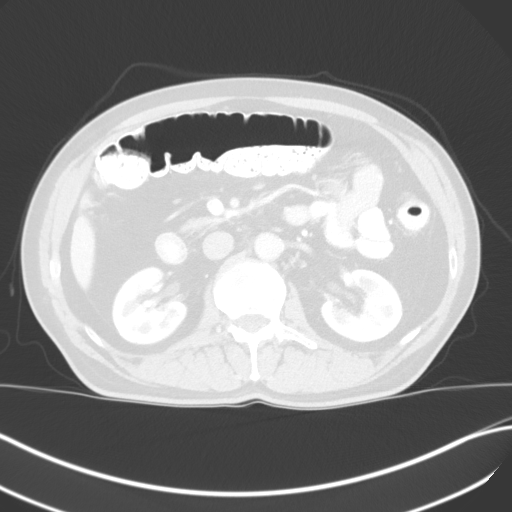
[im 84/140  lung]
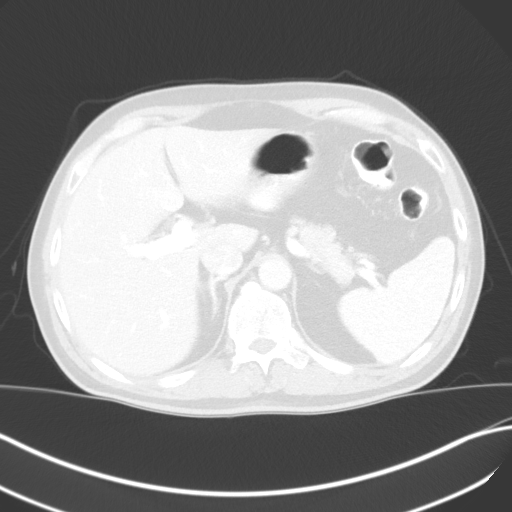
[im 98/140  lung]
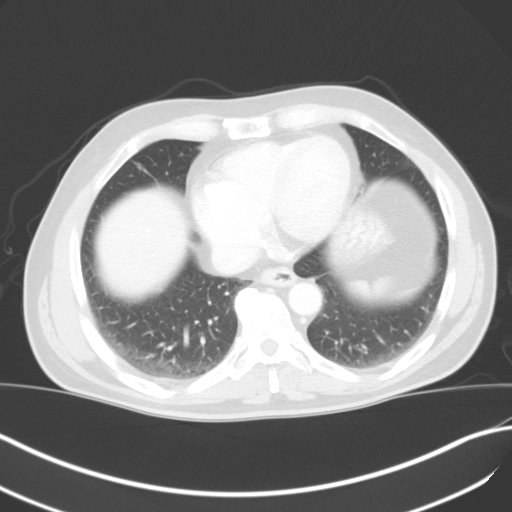
[im 112/140  lung]
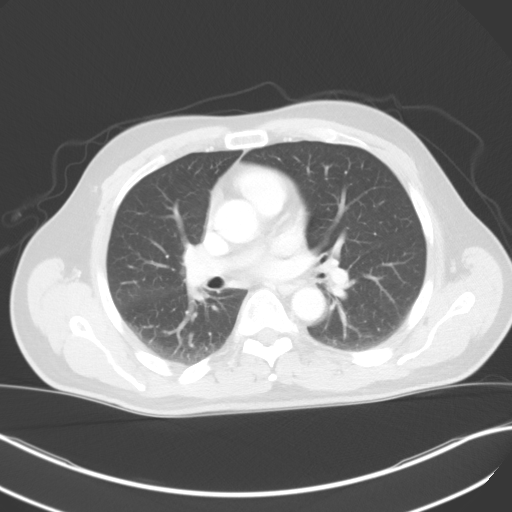
[im 126/140  mediastinal]
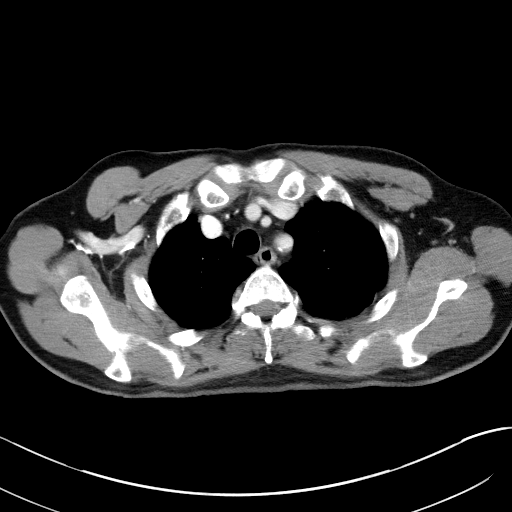
[im 126/140  lung]
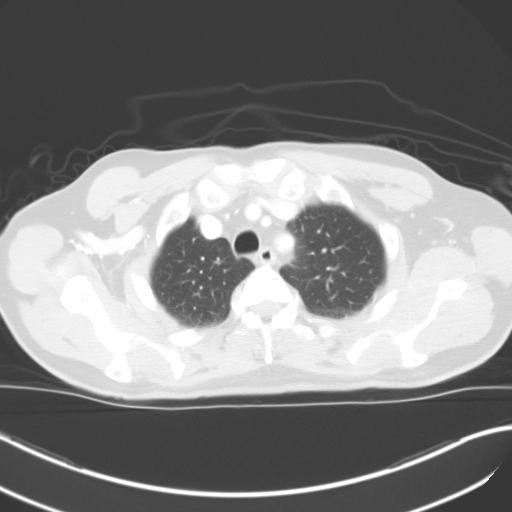

[Series 5: coronals · coronal · 0.87mm/px · 3 of 137 slices shown]
[im 28/137  lung]
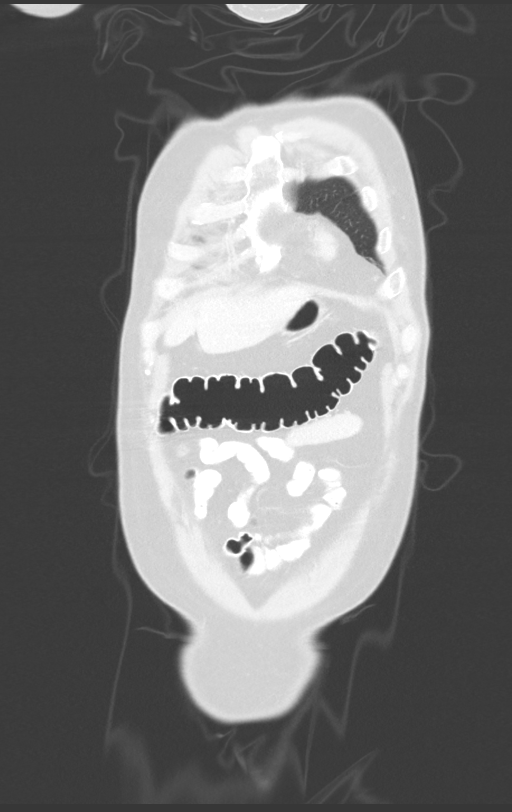
[im 55/137  lung]
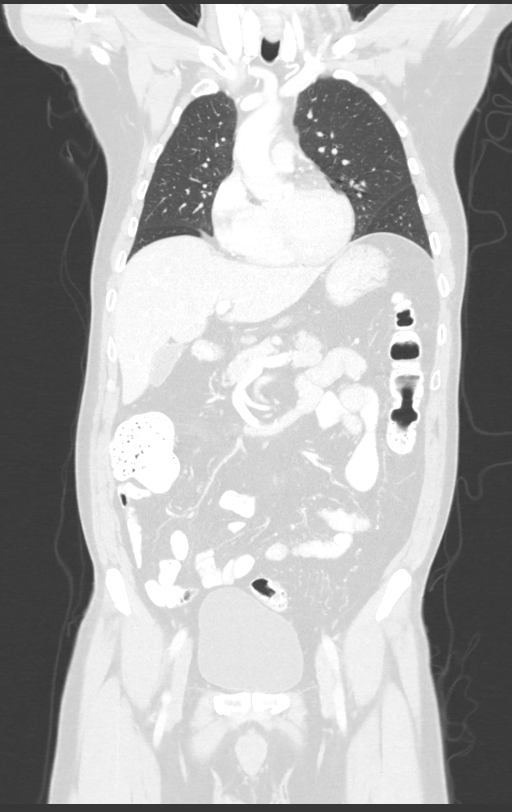
[im 82/137  lung]
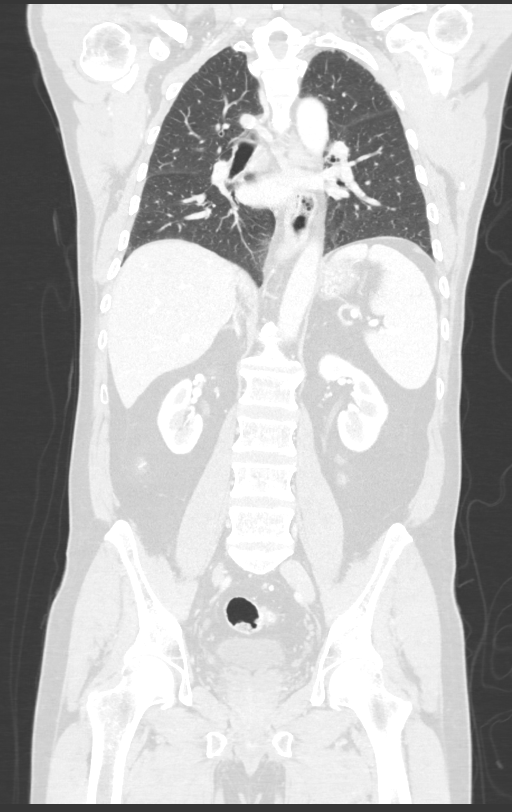

[12 of 36 positions shown; findings below may reference images not displayed]

FINDINGS: CT CHEST FINDINGS

Cardiovascular: The heart size is normal. No substantial pericardial
effusion. No thoracic aortic aneurysm.

Mediastinum/Nodes: No mediastinal lymphadenopathy. There is no hilar
lymphadenopathy. The esophagus has normal imaging features. There is
no axillary lymphadenopathy.

Lungs/Pleura: The central tracheobronchial airways are patent. No
suspicious pulmonary nodule or mass. No focal consolidation. No
pleural effusion.

Musculoskeletal: No worrisome lytic or sclerotic osseous
abnormality.

CT ABDOMEN PELVIS FINDINGS

Hepatobiliary: The subtle heterogeneous perfusion in the posterior
aspect of segment IV is similar to prior study. No new lesion within
the liver parenchyma. There is no evidence for gallstones,
gallbladder wall thickening, or pericholecystic fluid. No
intrahepatic or extrahepatic biliary dilation.

Pancreas: No focal mass lesion. No dilatation of the main duct. No
intraparenchymal cyst. No peripancreatic edema.

Spleen: No splenomegaly. No focal mass lesion.

Adrenals/Urinary Tract: No adrenal nodule or mass. 16 mm enhancing
lesion in the medial lower interpolar region of the right kidney is
stable when I remeasure on the prior study in a similar fashion.
Left kidney unremarkable. No evidence for hydroureter. The urinary
bladder appears normal for the degree of distention.

Stomach/Bowel: Stomach is unremarkable. No gastric wall thickening.
No evidence of outlet obstruction. Duodenum is normally positioned
as is the ligament of Treitz. No small bowel wall thickening. No
small bowel dilatation. Status post right hemicolectomy. No gross
colonic mass. No colonic wall thickening.

Vascular/Lymphatic: There is abdominal aortic atherosclerosis
without aneurysm. There is no gastrohepatic or hepatoduodenal
ligament lymphadenopathy. No intraperitoneal or retroperitoneal
lymphadenopathy. No pelvic sidewall lymphadenopathy.

Reproductive: The prostate gland and seminal vesicles are
unremarkable.

Other: No intraperitoneal free fluid.

Musculoskeletal: Small sclerotic focus roof of right acetabulum is
stable and compatible with bone island. No worrisome lytic or
sclerotic osseous abnormality.
IMPRESSION: 1. No new or progressive findings to suggest metastatic disease in
the chest, abdomen, or pelvis.
2. Stable appearance of heterogeneous attenuation in the posterior
aspect of segment IV, likely focal steatosis. Continued attention on
follow-up recommended.
3. Stable 16 mm enhancing lesion in the right kidney, compatible
with renal cell carcinoma.
4.  Aortic Atherosclerois (PVWDE-170.0)

## 2019-07-26 ENCOUNTER — Other Ambulatory Visit: Payer: Self-pay

## 2019-07-26 ENCOUNTER — Other Ambulatory Visit (HOSPITAL_COMMUNITY)
Admission: RE | Admit: 2019-07-26 | Discharge: 2019-07-26 | Disposition: A | Discharge status: Home / Self Care | Payer: Medicare HMO | Source: Ambulatory Visit | Attending: Internal Medicine | Admitting: Internal Medicine

## 2019-07-26 DIAGNOSIS — Z01812 Encounter for preprocedural laboratory examination: Secondary | ICD-10-CM | POA: Diagnosis present

## 2019-07-26 DIAGNOSIS — Z20822 Contact with and (suspected) exposure to covid-19: Secondary | ICD-10-CM | POA: Diagnosis not present

## 2019-07-26 LAB — SARS CORONAVIRUS 2 (TAT 6-24 HRS): SARS Coronavirus 2: NEGATIVE

## 2019-07-27 ENCOUNTER — Inpatient Hospital Stay (HOSPITAL_COMMUNITY): Payer: Medicare HMO | Attending: Hematology

## 2019-07-27 ENCOUNTER — Other Ambulatory Visit: Payer: Self-pay

## 2019-07-27 DIAGNOSIS — Z9049 Acquired absence of other specified parts of digestive tract: Secondary | ICD-10-CM | POA: Diagnosis not present

## 2019-07-27 DIAGNOSIS — N2889 Other specified disorders of kidney and ureter: Secondary | ICD-10-CM | POA: Diagnosis not present

## 2019-07-27 DIAGNOSIS — Z85038 Personal history of other malignant neoplasm of large intestine: Secondary | ICD-10-CM | POA: Insufficient documentation

## 2019-07-27 DIAGNOSIS — C182 Malignant neoplasm of ascending colon: Secondary | ICD-10-CM

## 2019-07-27 LAB — CBC WITH DIFFERENTIAL/PLATELET
Abs Immature Granulocytes: 0.03 10*3/uL (ref 0.00–0.07)
Basophils Absolute: 0.1 10*3/uL (ref 0.0–0.1)
Basophils Relative: 1 %
Eosinophils Absolute: 0.2 10*3/uL (ref 0.0–0.5)
Eosinophils Relative: 3 %
HCT: 46.6 % (ref 39.0–52.0)
Hemoglobin: 15.9 g/dL (ref 13.0–17.0)
Immature Granulocytes: 0 %
Lymphocytes Relative: 34 %
Lymphs Abs: 2.7 10*3/uL (ref 0.7–4.0)
MCH: 30.6 pg (ref 26.0–34.0)
MCHC: 34.1 g/dL (ref 30.0–36.0)
MCV: 89.8 fL (ref 80.0–100.0)
Monocytes Absolute: 0.6 10*3/uL (ref 0.1–1.0)
Monocytes Relative: 7 %
Neutro Abs: 4.2 10*3/uL (ref 1.7–7.7)
Neutrophils Relative %: 55 %
Platelets: 200 10*3/uL (ref 150–400)
RBC: 5.19 MIL/uL (ref 4.22–5.81)
RDW: 12.6 % (ref 11.5–15.5)
WBC: 7.7 10*3/uL (ref 4.0–10.5)
nRBC: 0 % (ref 0.0–0.2)

## 2019-07-27 LAB — COMPREHENSIVE METABOLIC PANEL
ALT: 28 U/L (ref 0–44)
AST: 23 U/L (ref 15–41)
Albumin: 4.6 g/dL (ref 3.5–5.0)
Alkaline Phosphatase: 80 U/L (ref 38–126)
Anion gap: 8 (ref 5–15)
BUN: 9 mg/dL (ref 8–23)
CO2: 25 mmol/L (ref 22–32)
Calcium: 9.1 mg/dL (ref 8.9–10.3)
Chloride: 101 mmol/L (ref 98–111)
Creatinine, Ser: 0.85 mg/dL (ref 0.61–1.24)
GFR calc Af Amer: >60 mL/min (ref >60)
GFR calc non Af Amer: >60 mL/min (ref >60)
Glucose, Bld: 92 mg/dL (ref 70–99)
Potassium: 4.1 mmol/L (ref 3.5–5.1)
Sodium: 134 mmol/L — ABNORMAL LOW (ref 135–145)
Total Bilirubin: 0.9 mg/dL (ref 0.3–1.2)
Total Protein: 7.1 g/dL (ref 6.5–8.1)

## 2019-07-28 ENCOUNTER — Encounter (HOSPITAL_COMMUNITY)
Admission: RE | Disposition: A | Discharge status: Home / Self Care | Payer: Self-pay | Source: Home / Self Care | Attending: Internal Medicine

## 2019-07-28 ENCOUNTER — Other Ambulatory Visit: Payer: Self-pay

## 2019-07-28 ENCOUNTER — Encounter (HOSPITAL_COMMUNITY): Payer: Self-pay | Admitting: Internal Medicine

## 2019-07-28 ENCOUNTER — Ambulatory Visit (HOSPITAL_COMMUNITY)
Admission: RE | Admit: 2019-07-28 | Discharge: 2019-07-28 | Disposition: A | Discharge status: Home / Self Care | Payer: Medicare HMO | Attending: Internal Medicine | Admitting: Internal Medicine

## 2019-07-28 DIAGNOSIS — Z1211 Encounter for screening for malignant neoplasm of colon: Secondary | ICD-10-CM | POA: Diagnosis present

## 2019-07-28 DIAGNOSIS — K219 Gastro-esophageal reflux disease without esophagitis: Secondary | ICD-10-CM | POA: Diagnosis not present

## 2019-07-28 DIAGNOSIS — Z9049 Acquired absence of other specified parts of digestive tract: Secondary | ICD-10-CM | POA: Diagnosis not present

## 2019-07-28 DIAGNOSIS — Z85038 Personal history of other malignant neoplasm of large intestine: Secondary | ICD-10-CM | POA: Diagnosis not present

## 2019-07-28 DIAGNOSIS — K635 Polyp of colon: Secondary | ICD-10-CM | POA: Diagnosis not present

## 2019-07-28 DIAGNOSIS — Z79899 Other long term (current) drug therapy: Secondary | ICD-10-CM | POA: Insufficient documentation

## 2019-07-28 DIAGNOSIS — D125 Benign neoplasm of sigmoid colon: Secondary | ICD-10-CM | POA: Insufficient documentation

## 2019-07-28 DIAGNOSIS — E785 Hyperlipidemia, unspecified: Secondary | ICD-10-CM | POA: Diagnosis not present

## 2019-07-28 HISTORY — PX: COLONOSCOPY: SHX5424

## 2019-07-28 HISTORY — PX: POLYPECTOMY: SHX5525

## 2019-07-28 LAB — CEA: CEA: 1.2 ng/mL (ref 0.0–4.7)

## 2019-07-28 SURGERY — COLONOSCOPY
Anesthesia: Moderate Sedation

## 2019-07-28 MED ORDER — MIDAZOLAM HCL 5 MG/5ML IJ SOLN
INTRAMUSCULAR | Status: DC | PRN
  Administered 2019-07-28: 2 mg via INTRAVENOUS
  Administered 2019-07-28 (×3): 1 mg via INTRAVENOUS

## 2019-07-28 MED ORDER — ONDANSETRON HCL 4 MG/2ML IJ SOLN
INTRAMUSCULAR | Status: DC | PRN
  Administered 2019-07-28: 4 mg via INTRAVENOUS

## 2019-07-28 MED ORDER — MIDAZOLAM HCL 5 MG/5ML IJ SOLN
INTRAMUSCULAR | Status: AC
  Filled 2019-07-28: qty 10

## 2019-07-28 MED ORDER — MEPERIDINE HCL 50 MG/ML IJ SOLN
INTRAMUSCULAR | Status: AC
  Filled 2019-07-28: qty 1

## 2019-07-28 MED ORDER — ONDANSETRON HCL 4 MG/2ML IJ SOLN
INTRAMUSCULAR | Status: AC
  Filled 2019-07-28: qty 2

## 2019-07-28 MED ORDER — MEPERIDINE HCL 100 MG/ML IJ SOLN
INTRAMUSCULAR | Status: DC | PRN
  Administered 2019-07-28: 25 mg

## 2019-07-28 MED ORDER — SODIUM CHLORIDE 0.9 % IV SOLN
INTRAVENOUS | Status: DC
  Administered 2019-07-28: 1000 mL via INTRAVENOUS

## 2019-07-28 NOTE — Discharge Instructions (Signed)
Colonoscopy Discharge Instructions  Read the instructions outlined below and refer to this sheet in the next few weeks. These discharge instructions provide you with general information on caring for yourself after you leave the hospital. Your doctor may also give you specific instructions. While your treatment has been planned according to the most current medical practices available, unavoidable complications occasionally occur. If you have any problems or questions after discharge, call Dr. Gala Romney at [phone]. ACTIVITY  You may resume your regular activity, but move at a slower pace for the next 24 hours.   Take frequent rest periods for the next 24 hours.   Walking will help get rid of the air and reduce the bloated feeling in your belly (abdomen).   No driving for 24 hours (because of the medicine (anesthesia) used during the test).    Do not sign any important legal documents or operate any machinery for 24 hours (because of the anesthesia used during the test).  NUTRITION  Drink plenty of fluids.   You may resume your normal diet as instructed by your doctor.   Begin with a light meal and progress to your normal diet. Heavy or fried foods are harder to digest and may make you feel sick to your stomach (nauseated).   Avoid alcoholic beverages for 24 hours or as instructed.  MEDICATIONS  You may resume your normal medications unless your doctor tells you otherwise.  WHAT YOU CAN EXPECT TODAY  Some feelings of bloating in the abdomen.   Passage of more gas than usual.   Spotting of blood in your stool or on the toilet paper.  IF YOU HAD POLYPS REMOVED DURING THE COLONOSCOPY:  No aspirin products for 7 days or as instructed.   No alcohol for 7 days or as instructed.   Eat a soft diet for the next 24 hours.  FINDING OUT THE RESULTS OF YOUR TEST Not all test results are available during your visit. If your test results are not back during the visit, make an appointment  with your caregiver to find out the results. Do not assume everything is normal if you have not heard from your caregiver or the medical facility. It is important for you to follow up on all of your test results.  SEEK IMMEDIATE MEDICAL ATTENTION IF:  You have more than a spotting of blood in your stool.   Your belly is swollen (abdominal distention).   You are nauseated or vomiting.   You have a temperature over 101.   You have abdominal pain or discomfort that is severe or gets worse throughout the day.   Colon polyp information provided  Further recommendations to follow pending review of pathology report  At patient request I called Roberts Gaudy, sister, at [phone] and reviewed results   Colon Polyps  Polyps are tissue growths inside the body. Polyps can grow in many places, including the large intestine (colon). A polyp may be a round bump or a mushroom-shaped growth. You could have one polyp or several. Most colon polyps are noncancerous (benign). However, some colon polyps can become cancerous over time. Finding and removing the polyps early can help prevent this. What are the causes? The exact cause of colon polyps is not known. What increases the risk? You are more likely to develop this condition if you:  Have a family history of colon cancer or colon polyps.  Are older than 76 or older than 45 if you are African American.  Have inflammatory bowel disease,  such as ulcerative colitis or Crohn's disease.  Have certain hereditary conditions, such as: ? Familial adenomatous polyposis. ? Lynch syndrome. ? Turcot syndrome. ? Peutz-Jeghers syndrome.  Are overweight.  Smoke cigarettes.  Do not get enough exercise.  Drink too much alcohol.  Eat a diet that is high in fat and red meat and low in fiber.  Had childhood cancer that was treated with abdominal radiation. What are the signs or symptoms? Most polyps do not cause symptoms. If you have symptoms,  they may include:  Blood coming from your rectum when having a bowel movement.  Blood in your stool. The stool may look dark red or black.  Abdominal pain.  A change in bowel habits, such as constipation or diarrhea. How is this diagnosed? This condition is diagnosed with a colonoscopy. This is a procedure in which a lighted, flexible scope is inserted into the anus and then passed into the colon to examine the area. Polyps are sometimes found when a colonoscopy is done as part of routine cancer screening tests. How is this treated? Treatment for this condition involves removing any polyps that are found. Most polyps can be removed during a colonoscopy. Those polyps will then be tested for cancer. Additional treatment may be needed depending on the results of testing. Follow these instructions at home: Lifestyle  Maintain a healthy weight, or lose weight if recommended by your health care provider.  Exercise every day or as told by your health care provider.  Do not use any products that contain nicotine or tobacco, such as cigarettes and e-cigarettes. If you need help quitting, ask your health care provider.  If you drink alcohol, limit how much you have: ? 0-1 drink a day for women. ? 0-2 drinks a day for men.  Be aware of how much alcohol is in your drink. In the [place] one drink equals one 12 oz bottle of beer (355 mL), one 5 oz glass of wine (148 mL), or one 1 oz shot of hard liquor (44 mL). Eating and drinking   Eat foods that are high in fiber, such as fruits, vegetables, and whole grains.  Eat foods that are high in calcium and vitamin D, such as milk, cheese, yogurt, eggs, liver, fish, and broccoli.  Limit foods that are high in fat, such as fried foods and desserts.  Limit the amount of red meat and processed meat you eat, such as hot dogs, sausage, bacon, and lunch meats. General instructions  Keep all follow-up visits as told by your health care provider. This is  important. ? This includes having regularly scheduled colonoscopies. ? Talk to your health care provider about when you need a colonoscopy. Contact a health care provider if:  You have new or worsening bleeding during a bowel movement.  You have new or increased blood in your stool.  You have a change in bowel habits.  You lose weight for no known reason. Summary  Polyps are tissue growths inside the body. Polyps can grow in many places, including the colon.  Most colon polyps are noncancerous (benign), but some can become cancerous over time.  This condition is diagnosed with a colonoscopy.  Treatment for this condition involves removing any polyps that are found. Most polyps can be removed during a colonoscopy. This information is not intended to replace advice given to you by your health care provider. Make sure you discuss any questions you have with your health care provider. Document Revised: 09/11/2017 Document Reviewed: 09/11/2017 Elsevier  Patient Education  [employer].

## 2019-07-28 NOTE — OR Nursing (Signed)
At anastomosis 1457

## 2019-07-28 NOTE — H&P (Signed)
'@LOGO' @   Primary Care Physician:  Jani Gravel, MD Primary Gastroenterologist:  Dr. Gala Romney  Pre-Procedure History & Physical: HPI:  Jack Gibbs is a 64 y.o. male here for first postoperative surveillance colonoscopy.  History of limited stage and ascending colon adenocarcinoma status post laparoscopic-assisted right hemicolectomy June of 2019.  He has done well.  Here for surveillance colonoscopy per plan.  Past Medical History:  Diagnosis Date  . Anemia   . Cancer [facility])    Ascending colon s/p right hemicolectomy on 11/10/2017.   Marland Kitchen GERD (gastroesophageal reflux disease)   . Hyperlipidemia   . Lower back pain     Past Surgical History:  Procedure Laterality Date  . BACK SURGERY    . BIOPSY  10/17/2017   Procedure: BIOPSY;  Surgeon: Daneil Dolin, MD;  [facility];  Service: Endoscopy;;  . COLON RESECTION N/A 11/10/2017   Procedure: LAPAROSCOPIC PARTIAL COLECTOMY;  Surgeon: [name], MD;  Location: [facility] ORS;  Service: General;  Laterality: N/A;  . COLONOSCOPY N/A 10/17/2017   Procedure: COLONOSCOPY;  Surgeon: Daneil Dolin, MD; bulky apple core tumor in the vicinity of a sending colon.  Diverticulosis in sigmoid and descending colon.  Pathology with invasive adenocarcinoma.    Prior to Admission medications   Medication Sig Start Date End Date Taking? Authorizing Provider  diphenhydrAMINE (BENADRYL) 25 MG tablet Take 25 mg by mouth every 6 (six) hours as needed for allergies.   Yes [provider]  Omega-3 Fatty Acids (FISH OIL PO) Take 1 capsule by mouth 4 (four) times a week.    Yes [provider]  rosuvastatin (CRESTOR) 5 MG tablet Take 5 mg by mouth at bedtime. 05/26/19  Yes [provider]  SUPREP BOWEL PREP KIT 17.5-3.13-1.6 GM/177ML SOLN Take 354 mLs by mouth once. follow package directions 06/10/19  Yes [provider]  ibuprofen (ADVIL) 200 MG tablet Take 400 mg by mouth every 8 (eight) hours as needed for headache  or moderate pain.     [provider]    Allergies as of 06/09/2019  . (No Known Allergies)    Family History  Problem Relation Age of Onset  . Stroke Mother   . Alcohol abuse Father   . Down syndrome Sister   . Alcohol abuse Brother   . Colon cancer Neg Hx   . Ulcers Neg Hx     Social History   Socioeconomic History  . Marital status: Single    Spouse name: Not on file  . Number of children: Not on file  . Years of education: Not on file  . Highest education level: Not on file  Occupational History  . Not on file  Tobacco Use  . Smoking status: Never Smoker  . Smokeless tobacco: Former Systems developer    Types: Chew  Substance and Sexual Activity  . Alcohol use: No  . Drug use: No  . Sexual activity: Not on file  Other Topics Concern  . Not on file  Social History Narrative  . Not on file   Social Determinants of Health   Financial Resource Strain:   . Difficulty of Paying Living Expenses: Not on file  Food Insecurity:   . Worried About Charity fundraiser in the Last Year: Not on file  . Ran Out of Food in the Last Year: Not on file  Transportation Needs:   . Lack of Transportation (Medical): Not on file  . Lack of Transportation (Non-Medical): Not on  file  Physical Activity:   . Days of Exercise per Week: Not on file  . Minutes of Exercise per Session: Not on file  Stress:   . Feeling of Stress : Not on file  Social Connections:   . Frequency of Communication with Friends and Family: Not on file  . Frequency of Social Gatherings with Friends and Family: Not on file  . Attends Religious Services: Not on file  . Active Member of Clubs or Organizations: Not on file  . Attends Archivist Meetings: Not on file  . Marital Status: Not on file  Intimate Partner Violence:   . Fear of Current or Ex-Partner: Not on file  . Emotionally Abused: Not on file  . Physically Abused: Not on file  . Sexually Abused: Not on file    Review of Systems: See  HPI, otherwise negative ROS  Physical Exam: BP (!) 143/80   Pulse 70   Temp 98.8 F (37.1 C) (Oral)   Resp 17   Ht 6' (1.829 m)   Wt 77.1 kg   SpO2 99%   BMI 23.06 kg/m  General:   Alert,  Well-developed, well-nourished, pleasant and cooperative in NAD Neck:  Supple; no masses or thyromegaly. No significant cervical adenopathy. Lungs:  Clear throughout to auscultation.   No wheezes, crackles, or rhonchi. No acute distress. Heart:  Regular rate and rhythm; no murmurs, clicks, rubs,  or gallops. Abdomen: Non-distended, normal bowel sounds.  Soft and nontender without appreciable mass or hepatosplenomegaly.  Pulses:  Normal pulses noted. Extremities:  Without clubbing or edema.  Impression/Plan: 64 year old gentleman with limited stage ascending colon adenocarcinoma -status post right hemicolectomy.  Doing well.  Here for surveillance colonoscopy. The risks, benefits, limitations, alternatives and imponderables have been reviewed with the patient. Questions have been answered. All parties are agreeable.      Notice: This dictation was prepared with Dragon dictation along with smaller phrase technology. Any transcriptional errors that result from this process are unintentional and may not be corrected upon review.

## 2019-07-28 NOTE — Op Note (Signed)
[facility] Patient Name: Jack Gibbs Procedure Date: 07/28/2019 2:36 PM MRN: [id number] Date of Birth: 04/09/1956 Attending MD: Norvel Alberts , MD CSN: [id number] Age: 64 Admit Type: Outpatient Procedure:                Colonoscopy Indications:              High risk colon cancer surveillance: Personal                            history of colon cancer Providers:                Norvel Bodkin, MD, Janeece Riggers, RN, Nelma Rothman, Technician Referring MD:              Medicines:                Meperidine 25 mg IV, Midazolam 5 mg IV, Ondansetron                            4 mg IV Complications:            No immediate complications. Estimated Blood Loss:     Estimated blood loss was minimal. Procedure:                Pre-Anesthesia Assessment:                           - Prior to the procedure, a History and Physical                            was performed, and patient medications and                            allergies were reviewed. The patient's tolerance of                            previous anesthesia was also reviewed. The risks                            and benefits of the procedure and the sedation                            options and risks were discussed with the patient.                            All questions were answered, and informed consent                            was obtained. Prior Anticoagulants: The patient has                            taken no previous anticoagulant or antiplatelet  agents. ASA Grade Assessment: II - A patient with                            mild systemic disease. After reviewing the risks                            and benefits, the patient was deemed in                            satisfactory condition to undergo the procedure.                           After obtaining informed consent, the colonoscope                            was passed under direct vision.  Throughout the                            procedure, the patient's blood pressure, pulse, and                            oxygen saturations were monitored continuously. The                            [id number]) scope was introduced through                            the anus and advanced to the the ileocolonic                            anastomosis. The colonoscopy was performed without                            difficulty. The patient tolerated the procedure                            well. The quality of the bowel preparation was                            adequate. Scope In: 2:55:18 PM Scope Out: 3:14:29 PM Scope Withdrawal Time: 0 hours 17 minutes 14 seconds  Total Procedure Duration: 0 hours 19 minutes 11 seconds  Findings:      The perianal and digital rectal examinations were normal.      A 5 mm polyp was found in the sigmoid colon. The polyp was sessile.       Status post right hemicolectomy with normal-appearing surgical       anastomosis with small bowel readily identified. The polyp was removed       with a cold snare. Resection and retrieval were complete. Estimated       blood loss was minimal.      The exam was otherwise without abnormality on direct and retroflexion       views. Impression:               - One 5 mm polyp in  the sigmoid colon, removed with                            a cold snare. Resected and retrieved. Status post                            right hemicolectomy                           - The examination was otherwise normal on direct                            and retroflexion views. Moderate Sedation:      Moderate (conscious) sedation was administered by the endoscopy nurse       and supervised by the endoscopist. The following parameters were       monitored: oxygen saturation, heart rate, blood pressure, respiratory       rate, EKG, adequacy of pulmonary ventilation, and response to care.       Total physician intraservice time was 22  minutes. Recommendation:           - Patient has a contact number available for                            emergencies. The signs and symptoms of potential                            delayed complications were discussed with the                            patient. Return to normal activities tomorrow.                            Written discharge instructions were provided to the                            patient.                           - Advance diet as tolerated.                           - Continue present medications.                           - Repeat colonoscopy date to be determined after                            pending pathology results are reviewed for                            surveillance based on pathology results.                           - Return to GI office (date not yet determined). Procedure Code(s):        --- Professional ---  45385, Colonoscopy, flexible; with removal of                            tumor(s), polyp(s), or other lesion(s) by snare                            technique                           G0500, Moderate sedation services provided by the                            same physician or other qualified health care                            professional performing a gastrointestinal                            endoscopic service that sedation supports,                            requiring the presence of an independent trained                            observer to assist in the monitoring of the                            patient's level of consciousness and physiological                            status; initial 15 minutes of intra-service time;                            patient age 43 years or older (additional time may                            be reported with [phone], as appropriate) Diagnosis Code(s):        --- Professional ---                           [id number], Personal history of other malignant                             neoplasm of large intestine                           K63.5, Polyp of colon CPT copyright 2019 American Medical Association. All rights reserved. The codes documented in this report are preliminary and upon coder review may  be revised to meet current compliance requirements. Cristopher Estimable. Zaelyn Barbary, MD Norvel Blackwell, MD 07/28/2019 3:23:18 PM This report has been signed electronically. Number of Addenda: 0

## 2019-07-30 ENCOUNTER — Encounter: Payer: Self-pay | Admitting: Internal Medicine

## 2019-07-30 ENCOUNTER — Ambulatory Visit (HOSPITAL_COMMUNITY)
Admission: RE | Admit: 2019-07-30 | Discharge: 2019-07-30 | Disposition: A | Discharge status: Home / Self Care | Payer: Medicare HMO | Source: Ambulatory Visit | Attending: Urology | Admitting: Urology

## 2019-07-30 ENCOUNTER — Other Ambulatory Visit: Payer: Self-pay

## 2019-07-30 DIAGNOSIS — C641 Malignant neoplasm of right kidney, except renal pelvis: Secondary | ICD-10-CM | POA: Insufficient documentation

## 2019-07-30 LAB — SURGICAL PATHOLOGY

## 2019-07-30 MED ORDER — GADOBUTROL 1 MMOL/ML IV SOLN
6.0000 mL | Freq: Once | INTRAVENOUS | Status: AC | PRN
  Administered 2019-07-30: 6 mL via INTRAVENOUS

## 2019-08-03 ENCOUNTER — Other Ambulatory Visit: Payer: Self-pay

## 2019-08-03 ENCOUNTER — Encounter (HOSPITAL_COMMUNITY): Payer: Self-pay | Admitting: Hematology

## 2019-08-03 ENCOUNTER — Inpatient Hospital Stay (HOSPITAL_BASED_OUTPATIENT_CLINIC_OR_DEPARTMENT_OTHER): Payer: Medicare HMO | Admitting: Hematology

## 2019-08-03 DIAGNOSIS — C182 Malignant neoplasm of ascending colon: Secondary | ICD-10-CM | POA: Diagnosis not present

## 2019-08-03 NOTE — Progress Notes (Signed)
Virtual Visit via Telephone Note  I connected with Jack Gibbs on 08/03/19 at  2:50 PM EST by telephone and verified that I am speaking with the correct person using two identifiers.   I discussed the limitations, risks, security and privacy concerns of performing an evaluation and management service by telephone and the availability of in person appointments. I also discussed with the patient that there may be a patient responsible charge related to this service. The patient expressed understanding and agreed to proceed.   History of Present Illness: Seen for follow-up of stage II ascending colon adenocarcinoma, status post right hemicolectomy on 11/10/2017.  He also has a right kidney mass that is being followed with serial imaging.  Observations/Objective: He denies any bleeding per rectum or melena.  Denies any fevers, night sweats or weight loss.  Denies any diarrhea or constipation or change in bowel habits.    Assessment and Plan:  1.  Stage II (T3N0) colon cancer: -Right hemicolectomy on 11/10/2017, MSI stable. -Colonoscopy on 07/28/2019 showed 5 mm polyp in the sigmoid colon consistent with tubular adenoma.  Exam was otherwise normal. -We reviewed blood work from 07/27/2019 which showed CEA of 1.2.  LFTs are normal.  CBC was also within normal limits. -MRI of the abdomen from 07/30/2019 did not show any evidence of metastatic disease.  CT scan from 04/28/2019 also showed stable exam. -We will see him back in 3 months with repeat labs and CEA.  2.  Right kidney mass: -We reviewed results of MRI dated 07/30/2019 showing 12 x 13 mm lesion in the posterior right lower kidney suggestive of renal cell carcinoma, grossly unchanged from previous CT scan from 04/28/2019. -We will continue to monitor it closely.   Follow Up Instructions: RTC 3 months with labs.   I discussed the assessment and treatment plan with the patient. The patient was provided an opportunity to ask questions and  all were answered. The patient agreed with the plan and demonstrated an understanding of the instructions.   The patient was advised to call back or seek an in-person evaluation if the symptoms worsen or if the condition fails to improve as anticipated.  I provided 7 minutes of non-face-to-face time during this encounter.   [name], MD

## 2019-10-28 ENCOUNTER — Inpatient Hospital Stay (HOSPITAL_COMMUNITY): Payer: Medicare HMO | Attending: Hematology

## 2019-10-28 ENCOUNTER — Other Ambulatory Visit: Payer: Self-pay

## 2019-10-28 DIAGNOSIS — K219 Gastro-esophageal reflux disease without esophagitis: Secondary | ICD-10-CM | POA: Diagnosis not present

## 2019-10-28 DIAGNOSIS — N2889 Other specified disorders of kidney and ureter: Secondary | ICD-10-CM | POA: Diagnosis not present

## 2019-10-28 DIAGNOSIS — M545 Low back pain: Secondary | ICD-10-CM | POA: Insufficient documentation

## 2019-10-28 DIAGNOSIS — E785 Hyperlipidemia, unspecified: Secondary | ICD-10-CM | POA: Diagnosis not present

## 2019-10-28 DIAGNOSIS — C182 Malignant neoplasm of ascending colon: Secondary | ICD-10-CM | POA: Insufficient documentation

## 2019-10-28 LAB — CBC WITH DIFFERENTIAL/PLATELET
Abs Immature Granulocytes: 0.01 10*3/uL (ref 0.00–0.07)
Basophils Absolute: 0.1 10*3/uL (ref 0.0–0.1)
Basophils Relative: 1 %
Eosinophils Absolute: 0.3 10*3/uL (ref 0.0–0.5)
Eosinophils Relative: 4 %
HCT: 48.1 % (ref 39.0–52.0)
Hemoglobin: 16.2 g/dL (ref 13.0–17.0)
Immature Granulocytes: 0 %
Lymphocytes Relative: 43 %
Lymphs Abs: 3 10*3/uL (ref 0.7–4.0)
MCH: 30.7 pg (ref 26.0–34.0)
MCHC: 33.7 g/dL (ref 30.0–36.0)
MCV: 91.3 fL (ref 80.0–100.0)
Monocytes Absolute: 0.5 10*3/uL (ref 0.1–1.0)
Monocytes Relative: 7 %
Neutro Abs: 3.1 10*3/uL (ref 1.7–7.7)
Neutrophils Relative %: 45 %
Platelets: 179 10*3/uL (ref 150–400)
RBC: 5.27 MIL/uL (ref 4.22–5.81)
RDW: 12.7 % (ref 11.5–15.5)
WBC: 6.9 10*3/uL (ref 4.0–10.5)
nRBC: 0 % (ref 0.0–0.2)

## 2019-10-28 LAB — COMPREHENSIVE METABOLIC PANEL
ALT: 26 U/L (ref 0–44)
AST: 24 U/L (ref 15–41)
Albumin: 4.7 g/dL (ref 3.5–5.0)
Alkaline Phosphatase: 81 U/L (ref 38–126)
Anion gap: 13 (ref 5–15)
BUN: 10 mg/dL (ref 8–23)
CO2: 24 mmol/L (ref 22–32)
Calcium: 9.3 mg/dL (ref 8.9–10.3)
Chloride: 101 mmol/L (ref 98–111)
Creatinine, Ser: 0.92 mg/dL (ref 0.61–1.24)
GFR calc Af Amer: >60 mL/min (ref >60)
GFR calc non Af Amer: >60 mL/min (ref >60)
Glucose, Bld: 102 mg/dL — ABNORMAL HIGH (ref 70–99)
Potassium: 4.2 mmol/L (ref 3.5–5.1)
Sodium: 138 mmol/L (ref 135–145)
Total Bilirubin: 1.1 mg/dL (ref 0.3–1.2)
Total Protein: 7.3 g/dL (ref 6.5–8.1)

## 2019-10-29 LAB — CEA: CEA: 1.5 ng/mL (ref 0.0–4.7)

## 2019-11-03 ENCOUNTER — Other Ambulatory Visit: Payer: Self-pay

## 2019-11-03 ENCOUNTER — Inpatient Hospital Stay (HOSPITAL_COMMUNITY): Payer: Medicare HMO | Admitting: Hematology

## 2019-11-03 VITALS — BP 136/68 | HR 64 | Temp 98.9°F | Resp 18 | Wt 172.0 lb

## 2019-11-03 DIAGNOSIS — D500 Iron deficiency anemia secondary to blood loss (chronic): Secondary | ICD-10-CM

## 2019-11-03 DIAGNOSIS — C182 Malignant neoplasm of ascending colon: Secondary | ICD-10-CM

## 2019-11-03 NOTE — Progress Notes (Signed)
[place], [place] [address]   CLINIC:  Medical Oncology/Hematology  PCP:  Jani Gravel, MD [address] Cathie Beams [place] [address] [phone]   REASON FOR VISIT:  Follow-up for Stage II colon cancer  PRIOR THERAPY: Right hemicolectomy on 11/10/2017, T3N0  NGS Results: MSI-stable  CURRENT THERAPY: Observation  BRIEF ONCOLOGIC HISTORY:  Oncology History  Colon cancer ([place])  10/28/2017 Initial Diagnosis   Colon cancer ([place])   11/24/2017 Cancer Staging   Staging form: Colon and Rectum, AJCC 8th Edition - Clinical: Stage IIA (cT3, cN0, cM0) - Signed by [name], MD on 11/24/2017     CANCER STAGING: Cancer Staging Colon cancer [facility]) Staging form: Colon and Rectum, AJCC 8th Edition - Clinical: Stage IIA (cT3, cN0, cM0) - Signed by [name], MD on 11/24/2017   INTERVAL HISTORY:  [name], a 64 y.o. male, returns for routine follow-up of his colon cancer. Baran was last seen virtually on 07/14/2019. Denies any bleeding per rectum or melena. No change in bowel habits. Appetite and energy levels are 400%.   REVIEW OF SYSTEMS:  Review of Systems  Gastrointestinal: Negative for blood in stool.  Genitourinary: Negative for hematuria.   All other systems reviewed and are negative.   PAST MEDICAL/SURGICAL HISTORY:  Past Medical History:  Diagnosis Date  . Anemia   . Cancer [facility])    Ascending colon s/p right hemicolectomy on 11/10/2017.   Marland Kitchen GERD (gastroesophageal reflux disease)   . Hyperlipidemia   . Lower back pain    Past Surgical History:  Procedure Laterality Date  . BACK SURGERY    . BIOPSY  10/17/2017   Procedure: BIOPSY;  Surgeon: Daneil Dolin, MD;  Location: [facility];  Service: Endoscopy;;  . COLON RESECTION N/A 11/10/2017   Procedure: LAPAROSCOPIC PARTIAL COLECTOMY;  Surgeon: [name], MD;  Location: [facility] ORS;  Service: General;  Laterality: N/A;  . COLONOSCOPY  N/A 10/17/2017   Procedure: COLONOSCOPY;  Surgeon: Daneil Dolin, MD; bulky apple core tumor in the vicinity of a sending colon.  Diverticulosis in sigmoid and descending colon.  Pathology with invasive adenocarcinoma.  . COLONOSCOPY N/A 07/28/2019   Procedure: COLONOSCOPY;  Surgeon: Daneil Dolin, MD;  Location: [facility];  Service: Endoscopy;  Laterality: N/A;  2:15pm  . POLYPECTOMY  07/28/2019   Procedure: POLYPECTOMY;  Surgeon: Daneil Dolin, MD;  Location: [facility];  Service: Endoscopy;;    SOCIAL HISTORY:  Social History   Socioeconomic History  . Marital status: Single    Spouse name: Not on file  . Number of children: Not on file  . Years of education: Not on file  . Highest education level: Not on file  Occupational History  . Not on file  Tobacco Use  . Smoking status: Never Smoker  . Smokeless tobacco: Former Systems developer    Types: Chew  Substance and Sexual Activity  . Alcohol use: No  . Drug use: No  . Sexual activity: Not on file  Other Topics Concern  . Not on file  Social History Narrative  . Not on file   Social Determinants of Health   Financial Resource Strain:   . Difficulty of Paying Living Expenses:   Food Insecurity:   . Worried About Charity fundraiser in the Last Year:   . [place] in the Last Year:   Transportation Needs:   . Lack of  Transportation (Medical):   Marland Kitchen Lack of Transportation (Non-Medical):   Physical Activity:   . Days of Exercise per Week:   . Minutes of Exercise per Session:   Stress:   . Feeling of Stress :   Social Connections:   . Frequency of Communication with Friends and Family:   . Frequency of Social Gatherings with Friends and Family:   . Attends Religious Services:   . Active Member of Clubs or Organizations:   . Attends Archivist Meetings:   Marland Kitchen Marital Status:   Intimate Partner Violence:   . Fear of Current or Ex-Partner:   . Emotionally Abused:   Marland Kitchen Physically Abused:   . Sexually  Abused:     FAMILY HISTORY:  Family History  Problem Relation Age of Onset  . Stroke Mother   . Alcohol abuse Father   . Down syndrome Sister   . Alcohol abuse Brother   . Colon cancer Neg Hx   . Ulcers Neg Hx     CURRENT MEDICATIONS:  Current Outpatient Medications  Medication Sig Dispense Refill  . Omega-3 Fatty Acids (FISH OIL PO) Take 1 capsule by mouth 4 (four) times a week.     . rosuvastatin (CRESTOR) 10 MG tablet SMARTSIG:1 Tablet(s) By Mouth Every Evening    . diphenhydrAMINE (BENADRYL) 25 MG tablet Take 25 mg by mouth every 6 (six) hours as needed for allergies.     No current facility-administered medications for this visit.    ALLERGIES:  No Known Allergies  PHYSICAL EXAM:  Performance status (ECOG): 1 - Symptomatic but completely ambulatory  Vitals:   11/03/19 1537  BP: 136/68  Pulse: 64  Resp: 18  Temp: 98.9 F (37.2 C)  SpO2: 98%   Wt Readings from Last 3 Encounters:  11/03/19 172 lb (78 kg)  07/28/19 170 lb (77.1 kg)  06/09/19 165 lb 12.8 oz (75.2 kg)   Physical Exam Vitals reviewed.  Constitutional:      Appearance: Normal appearance.  Cardiovascular:     Rate and Rhythm: Normal rate.     Heart sounds: Normal heart sounds.  Pulmonary:     Effort: Pulmonary effort is normal.     Breath sounds: Normal breath sounds.  Abdominal:     General: There is no distension.     Palpations: Abdomen is soft. There is no mass.  Skin:    General: Skin is warm.  Neurological:     General: No focal deficit present.     Mental Status: He is alert and oriented to person, place, and time.  Psychiatric:        Mood and Affect: Mood normal.        Behavior: Behavior normal.      LABORATORY DATA:  I have reviewed the labs as listed.  CBC Latest Ref Rng & Units 10/28/2019 07/27/2019 04/28/2019  WBC 4.0 - 10.5 K/uL 6.9 7.7 6.5  Hemoglobin 13.0 - 17.0 g/dL 16.2 15.9 16.9  Hematocrit 39.0 - 52.0 % 48.1 46.6 50.1  Platelets 150 - 400 K/uL 179 200 223    CMP Latest Ref Rng & Units 10/28/2019 07/27/2019 04/28/2019  Glucose 70 - 99 mg/dL 102(H) 92 103(H)  BUN 8 - 23 mg/dL '10 9 10  ' Creatinine 0.61 - 1.24 mg/dL 0.92 0.85 0.89  Sodium 135 - 145 mmol/L 138 134(L) 136  Potassium 3.5 - 5.1 mmol/L 4.2 4.1 4.0  Chloride 98 - 111 mmol/L 101 101 101  CO2 22 - 32 mmol/L 24 25  24  Calcium 8.9 - 10.3 mg/dL 9.3 9.1 9.7  Total Protein 6.5 - 8.1 g/dL 7.3 7.1 7.6  Total Bilirubin 0.3 - 1.2 mg/dL 1.1 0.9 1.0  Alkaline Phos 38 - 126 U/L 81 80 75  AST 15 - 41 U/L '24 23 21  ' ALT 0 - 44 U/L '26 28 19    ' DIAGNOSTIC IMAGING:  I have independently reviewed the scans and discussed with the patient.   ASSESSMENT:  1. Stage II (T3N0) colon cancer: -Right hemicolectomy on 11/10/2017, MSI stable. -Colonoscopy on 07/28/2019 showed 5 mm polyp in the sigmoid colon consistent with tubular adenoma.  Exam otherwise normal. -MRI of the abdomen on 07/30/2019 did not show any evidence of metastatic disease.  CT scanning on 04/28/2019 also showed stable exam.    PLAN:  #1 stage II colon cancer: -Denies any change in bowel habits.  No bleeding per rectum. -We reviewed his labs including LFTs which are within normal limits.  CEA on 10/28/2019 was 1.5. -I have recommended follow-up in 3 months with repeat CEA, routine labs.  We will also obtain CT of the abdomen.  2.  Right kidney mass: -MRI abdomen on 07/30/2019 showed 12 x 13 mm lesion in the posterior right lower kidney, poorly evaluated on dynamic postcontrast imaging due to motion degradation but suggestive of solid renal neoplasm.  This is grossly unchanged from prior CT scan on 04/28/2019. -I have recommended a follow-up CT scan of the abdomen with and without contrast in 3 months. -We will also consider urology consultation if there is any significant changes.   Orders placed this encounter:  No orders of the defined types were placed in this encounter.    [name], MD [facility] [phone]   I, Jacqualyn Posey, am acting as a scribe for Dr. Sanda Linger.  I, [name] MD, have reviewed the above documentation for accuracy and completeness, and I agree with the above.

## 2019-11-03 NOTE — Patient Instructions (Signed)
[place] at [facility] Discharge Instructions  You were seen today by Dr. Delton Coombes. He went over your recent results. We will repeat your CT scans before your next visit. He will see you back in 3 months for labs and follow up.   Thank you for choosing [place] at [facility] to provide your oncology and hematology care.  To afford each patient quality time with our provider, please arrive at least 15 minutes before your scheduled appointment time.   If you have a lab appointment with the [place] please come in thru the  Main Entrance and check in at the main information desk  You need to re-schedule your appointment should you arrive 10 or more minutes late.  We strive to give you quality time with our providers, and arriving late affects you and other patients whose appointments are after yours.  Also, if you no show three or more times for appointments you may be dismissed from the clinic at the providers discretion.     Again, thank you for choosing [facility].  Our hope is that these requests will decrease the amount of time that you wait before being seen by our physicians.       _____________________________________________________________  Should you have questions after your visit to [facility], please contact our office at [phone] between the hours of 8:00 a.m. and 4:30 p.m.  Voicemails left after 4:00 p.m. will not be returned until the following business day.  For prescription refill requests, have your pharmacy contact our office and allow 72 hours.    Cancer Center Support Programs:   > Cancer Support Group  2nd Tuesday of the month 1pm-2pm, Journey Room

## 2019-11-04 NOTE — Addendum Note (Signed)
Addended by: Farley Ly on: 11/04/2019 09:34 AM   Modules accepted: Orders

## 2020-01-05 ENCOUNTER — Telehealth: Payer: Self-pay

## 2020-01-05 NOTE — Telephone Encounter (Signed)
Left message for patient to return call.

## 2020-01-05 NOTE — Telephone Encounter (Signed)
-----   Message from Cleon Gustin, MD sent at 12/31/2019  9:02 AM EDT ----- When is his follwoup? ----- Message ----- From: Dorisann Frames, RN Sent: 08/02/2019   9:29 AM EDT To: Cleon Gustin, MD  Please review

## 2020-02-01 ENCOUNTER — Ambulatory Visit (HOSPITAL_COMMUNITY): Admission: RE | Admit: 2020-02-01 | Payer: Medicare HMO | Source: Ambulatory Visit

## 2020-02-01 ENCOUNTER — Inpatient Hospital Stay (HOSPITAL_COMMUNITY): Payer: Medicare HMO | Attending: Hematology

## 2020-02-03 ENCOUNTER — Other Ambulatory Visit (HOSPITAL_COMMUNITY): Payer: Self-pay | Admitting: Nurse Practitioner

## 2020-02-03 DIAGNOSIS — C182 Malignant neoplasm of ascending colon: Secondary | ICD-10-CM

## 2020-02-03 NOTE — Progress Notes (Signed)
Ct a 

## 2020-02-08 ENCOUNTER — Ambulatory Visit (HOSPITAL_COMMUNITY): Payer: Medicare HMO | Admitting: Hematology

## 2020-02-09 ENCOUNTER — Other Ambulatory Visit: Payer: Self-pay

## 2020-02-09 ENCOUNTER — Ambulatory Visit (INDEPENDENT_AMBULATORY_CARE_PROVIDER_SITE_OTHER): Payer: Medicare HMO | Admitting: Urology

## 2020-02-09 ENCOUNTER — Encounter: Payer: Self-pay | Admitting: Urology

## 2020-02-09 VITALS — BP 175/86 | HR 74 | Temp 98.5°F | Ht 65.0 in | Wt 170.0 lb

## 2020-02-09 DIAGNOSIS — Z85528 Personal history of other malignant neoplasm of kidney: Secondary | ICD-10-CM | POA: Diagnosis not present

## 2020-02-09 LAB — URINALYSIS, ROUTINE W REFLEX MICROSCOPIC
Bilirubin, UA: NEGATIVE
Glucose, UA: NEGATIVE
Ketones, UA: NEGATIVE
Leukocytes,UA: NEGATIVE
Nitrite, UA: NEGATIVE
Protein,UA: NEGATIVE
RBC, UA: NEGATIVE
Specific Gravity, UA: 1.015 (ref 1.005–1.030)
Urobilinogen, Ur: 0.2 mg/dL (ref 0.2–1.0)
pH, UA: 7 (ref 5.0–7.5)

## 2020-02-09 NOTE — Progress Notes (Signed)
Urological Symptom Review  Patient is experiencing the following symptoms: Get up at night to urinate   Review of Systems  Gastrointestinal (upper)  : Negative for upper GI symptoms  Gastrointestinal (lower) : Negative for lower GI symptoms  Constitutional : Negative for symptoms  Skin: Negative for skin symptoms  Eyes: Negative for eye symptoms  Ear/Nose/Throat : Negative for Ear/Nose/Throat symptoms  Hematologic/Lymphatic: Negative for Hematologic/Lymphatic symptoms  Cardiovascular : Negative for cardiovascular symptoms  Respiratory : Negative for respiratory symptoms  Endocrine: Negative for endocrine symptoms  Musculoskeletal: Back pain Joint pain  Neurological: Negative for neurological symptoms  Psychologic: Negative for psychiatric symptoms

## 2020-02-09 NOTE — Progress Notes (Signed)
02/09/2020 9:52 AM   Jack Gibbs 04-11-1956 [id number]  Referring provider: Jani Gravel, MD [address] [place],  [place] [address]  Right renal mass  HPI:  [name] is a 64yo here for followup for a right renal mass. He underwent MRI in 07/2019 which showed a stable 79mm right cystic renal mass. No flank pain. NO LUTS. He is scheduled to undergo CT with Dr. Delton Coombes next months  His records from [facility] are as follows:  I have a renal mass.  HPI: Jack Gibbs is a 64 year-old male established patient who is here for a renal mass.  The problem is on the right side. His problem was diagnosed 10/29/2017. He has had no symptoms. He had the following x-rays done: MRI Scan. He has not had kidney surgery.   He has not seen blood in his urine. He does have a good appetite. BOWEL HABITS: his bowels are moving normally. He is not having pain in new locations. He has not recently had unwanted weight loss.   02/18/2018: He underwent MRI on 5/22 which showed a cystic 1.5cm right posterior mesophytic renal mass.   08/19/2018: Repeat MRI showed the right renal mass had grown 2-70mm since last MRI in 10/2017. no flank pain. no hematuria   02/24/2019: Renal US failed to show the renal mass. no hematuria or LUTS     PMH: Past Medical History:  Diagnosis Date  . Anemia   . Cancer [facility])    Ascending colon s/p right hemicolectomy on 11/10/2017.   Marland Kitchen GERD (gastroesophageal reflux disease)   . Hyperlipidemia   . Lower back pain     Surgical History: Past Surgical History:  Procedure Laterality Date  . BACK SURGERY    . BIOPSY  10/17/2017   Procedure: BIOPSY;  Surgeon: Daneil Dolin, MD;  [facility];  Service: Endoscopy;;  . COLON RESECTION N/A 11/10/2017   Procedure: LAPAROSCOPIC PARTIAL COLECTOMY;  Surgeon: [name], MD;  Location: [facility] ORS;  Service: General;  Laterality: N/A;  . COLONOSCOPY N/A 10/17/2017   Procedure: COLONOSCOPY;  Surgeon: Daneil Dolin, MD; bulky apple core tumor in the vicinity of a sending colon.  Diverticulosis in sigmoid and descending colon.  Pathology with invasive adenocarcinoma.  . COLONOSCOPY N/A 07/28/2019   Procedure: COLONOSCOPY;  Surgeon: Daneil Dolin, MD;  [facility];  Service: Endoscopy;  Laterality: N/A;  2:15pm  . POLYPECTOMY  07/28/2019   Procedure: POLYPECTOMY;  Surgeon: Daneil Dolin, MD;  [facility];  Service: Endoscopy;;    Home Medications:  Allergies as of 02/09/2020   No Known Allergies     Medication List       Accurate as of February 09, 2020  9:52 AM. If you have any questions, ask your nurse or doctor.        diphenhydrAMINE 25 MG tablet Commonly known as: BENADRYL Take 25 mg by mouth every 6 (six) hours as needed for allergies.   FISH OIL PO Take 1 capsule by mouth 4 (four) times a week.   rosuvastatin 10 MG tablet Commonly known as: CRESTOR SMARTSIG:1 Tablet(s) By Mouth Every Evening   rosuvastatin 20 MG tablet Commonly known as: CRESTOR Take 20 mg by mouth daily.       Allergies: No Known Allergies  Family History: Family History  Problem Relation Age of Onset  . Stroke Mother   . Alcohol abuse Father   . Down syndrome Sister   .  Alcohol abuse Brother   . Colon cancer Neg Hx   . Ulcers Neg Hx     Social History:  reports that he has never smoked. He quit smokeless tobacco use about 41 years ago.  His smokeless tobacco use included chew. He reports that he does not drink alcohol and does not use drugs.  ROS: All other review of systems were reviewed and are negative except what is noted above in HPI  Physical Exam: BP (!) 175/86   Pulse 74   Temp 98.5 F (36.9 C)   Ht 5\' 5"  (1.651 m)   Wt 170 lb (77.1 kg)   BMI 28.29 kg/m   Constitutional:  Alert and oriented, No acute distress. HEENT: [place] AT, moist mucus membranes.  Trachea midline, no masses. Cardiovascular: No clubbing, cyanosis, or edema. Respiratory: Normal  respiratory effort, no increased work of breathing. GI: Abdomen is soft, nontender, nondistended, no abdominal masses GU: No CVA tenderness.  Lymph: No cervical or inguinal lymphadenopathy. Skin: No rashes, bruises or suspicious lesions. Neurologic: Grossly intact, no focal deficits, moving all 4 extremities. Psychiatric: Normal mood and affect.  Laboratory Data: Lab Results  Component Value Date   WBC 6.9 10/28/2019   HGB 16.2 10/28/2019   HCT 48.1 10/28/2019   MCV 91.3 10/28/2019   PLT 179 10/28/2019    Lab Results  Component Value Date   CREATININE 0.92 10/28/2019    No results found for: PSA  No results found for: TESTOSTERONE  No results found for: HGBA1C  Urinalysis No results found for: COLORURINE, APPEARANCEUR, LABSPEC, PHURINE, GLUCOSEU, HGBUR, BILIRUBINUR, KETONESUR, PROTEINUR, UROBILINOGEN, NITRITE, LEUKOCYTESUR  No results found for: LABMICR, Francis, RBCUA, LABEPIT, MUCUS, BACTERIA  Pertinent Imaging: MRI 07/2019: Images reviewed and disucssed with the patient No results found for this or any previous visit.  No results found for this or any previous visit.  No results found for this or any previous visit.  No results found for this or any previous visit.  Results for orders placed during the hospital encounter of 02/04/19  US RENAL  Narrative CLINICAL DATA:  64 year old male with history of renal cell carcinoma.  EXAM: RENAL / URINARY TRACT ULTRASOUND COMPLETE  COMPARISON:  CT of the abdomen pelvis dated 04/28/2018 and abdominal MRI dated 10/29/2017  FINDINGS: Right Kidney:  Renal measurements: 50.7 x 4.6 x 4.8 cm = volume: 124 mL. Normal echogenicity. No hydronephrosis or shadowing stone. The mass seen on the prior CT and MRI is not visualized on this ultrasound.  Left Kidney:  Renal measurements: 10.5 x 5.7 x 5.1 cm = volume: 160 mL. Normal echogenicity. No hydronephrosis or shadowing stone.  Bladder:  Appears normal for degree of  bladder distention.  IMPRESSION: Unremarkable renal ultrasound. Nonvisualization of the known right renal mass on this ultrasound.   Electronically Signed By: Anner Crete M.D. On: 02/04/2019 19:16  No results found for this or any previous visit.  No results found for this or any previous visit.  No results found for this or any previous visit.   Assessment & Plan:    1. History of kidney cancer -We discussed the management including active surveillance, ablation and partial nephrectomy and the patient elects for surveillance. RTC 6 months with renal US - Urinalysis, Routine w reflex microscopic   No follow-ups on file.  Nicolette Bang, MD  [facility] [place]

## 2020-02-09 NOTE — Patient Instructions (Signed)
Renal Mass  A renal mass is a growth in the kidney. A renal mass may be found while performing an MRI, CT scan, or ultrasound for other problems of the abdomen. Certain types of cancers, infections, or injuries can cause a renal mass. A renal mass that is cancerous (malignant) may grow or spread quickly. Others are harmless (benign). What are common types of renal masses? Renal masses include:  Tumors. These may be cancerous (malignant) or noncancerous (benign). ? The most common type of kidney cancer is renal cell carcinoma. ? The most common benign tumors of the kidney include renal adenomas, oncocytomas, and angiomyolipoma (AML).  Cysts. These are fluid-filled sacs that form on or in the kidney. ? It is not always known what causes a cyst to develop in or on the kidney. ? Most kidney cysts do not cause symptoms and do not need to be treated. What type of testing might I need? Your health care provider may recommend that you have tests to diagnose the cause of your renal mass. The following tests may be done if a renal mass is found:  Physical exam.  Blood tests.  Urine tests.  Imaging tests, such as ultrasound, CT scan, or MRI.  Biopsy. This is a small sample that is removed from the renal mass and tested in a lab. The exact tests and how often they are done will depend on:  The size and appearance of the renal mass.  Risk factors or medical conditions that increase your risk for problems.  Any symptoms associated with the renal mass, or concerns that you have about it. Tests and physical exams may be done once, or they may be done regularly for a period of time. Tests and exams that are done regularly will help monitor whether the mass is growing and beginning to cause problems. What are common treatments for renal masses? Treatment is not always needed for this condition. Your health care provider may recommend careful monitoring (watchful waiting) and regular tests and exams.  Treatment will depend on the cause of the mass. Follow these instructions at home: What you need to do at home will depend on the cause of the mass. Follow the instructions that your health care provider gives to you. In general:  Take over-the-counter and prescription medicines only as told by your health care provider.  If you are prescribed an antibiotic medicine, take it as told by your health care provider. Do not stop taking the antibiotic even if you start to feel better.  Follow any restrictions that are given to you by your health care provider.  Keep all follow-up visits as told by your health care provider. This is important. ? You may need to see your health care provider once or twice a year to have CT scans and ultrasounds done. These tests will show if your renal mass has changed or grown bigger. Contact a health care provider if you:  Have pain in the side or back (flank pain).  Have a fever.  Feel full soon after eating.  Have pain or swelling in the abdomen.  Lose weight. Get help right away if:  Your pain gets worse.  There is blood in your urine.  You cannot urinate.  You have chest pain.  You have trouble breathing. Summary  A renal mass is a growth in the kidney. It may be cancerous (malignant) and grow or spread quickly, or it may be harmless (benign).  Renal masses may be found while performing   an MRI, CT scan, or ultrasound for other problems of the abdomen.  Your health care provider may recommend that you have tests to diagnose the cause of your renal mass. This may include a physical exam, blood tests, urine tests, imaging, or a biopsy.  Treatment is not always needed for this condition. Careful monitoring (watchful waiting) may be recommended. This information is not intended to replace advice given to you by your health care provider. Make sure you discuss any questions you have with your health care provider. Document Revised: 07/03/2017  Document Reviewed: 07/03/2017 Elsevier Patient Education  2020 Elsevier Inc.  

## 2020-02-23 ENCOUNTER — Ambulatory Visit (HOSPITAL_COMMUNITY)
Admission: RE | Admit: 2020-02-23 | Discharge: 2020-02-23 | Disposition: A | Discharge status: Home / Self Care | Payer: Medicare HMO | Source: Ambulatory Visit | Attending: Hematology | Admitting: Hematology

## 2020-02-23 ENCOUNTER — Other Ambulatory Visit: Payer: Self-pay

## 2020-02-23 ENCOUNTER — Inpatient Hospital Stay (HOSPITAL_COMMUNITY): Payer: Medicare HMO | Attending: Hematology

## 2020-02-23 DIAGNOSIS — F17220 Nicotine dependence, chewing tobacco, uncomplicated: Secondary | ICD-10-CM | POA: Insufficient documentation

## 2020-02-23 DIAGNOSIS — Z85038 Personal history of other malignant neoplasm of large intestine: Secondary | ICD-10-CM | POA: Diagnosis present

## 2020-02-23 DIAGNOSIS — C182 Malignant neoplasm of ascending colon: Secondary | ICD-10-CM

## 2020-02-23 DIAGNOSIS — N2889 Other specified disorders of kidney and ureter: Secondary | ICD-10-CM | POA: Diagnosis not present

## 2020-02-23 DIAGNOSIS — D509 Iron deficiency anemia, unspecified: Secondary | ICD-10-CM | POA: Insufficient documentation

## 2020-02-23 LAB — COMPREHENSIVE METABOLIC PANEL
ALT: 22 U/L (ref 0–44)
AST: 22 U/L (ref 15–41)
Albumin: 4.6 g/dL (ref 3.5–5.0)
Alkaline Phosphatase: 68 U/L (ref 38–126)
Anion gap: 10 (ref 5–15)
BUN: 11 mg/dL (ref 8–23)
CO2: 24 mmol/L (ref 22–32)
Calcium: 9.3 mg/dL (ref 8.9–10.3)
Chloride: 99 mmol/L (ref 98–111)
Creatinine, Ser: 0.88 mg/dL (ref 0.61–1.24)
GFR calc Af Amer: >60 mL/min (ref >60)
GFR calc non Af Amer: >60 mL/min (ref >60)
Glucose, Bld: 102 mg/dL — ABNORMAL HIGH (ref 70–99)
Potassium: 3.8 mmol/L (ref 3.5–5.1)
Sodium: 133 mmol/L — ABNORMAL LOW (ref 135–145)
Total Bilirubin: 1.3 mg/dL — ABNORMAL HIGH (ref 0.3–1.2)
Total Protein: 7.2 g/dL (ref 6.5–8.1)

## 2020-02-23 LAB — CBC WITH DIFFERENTIAL/PLATELET
Abs Immature Granulocytes: 0.02 10*3/uL (ref 0.00–0.07)
Basophils Absolute: 0.1 10*3/uL (ref 0.0–0.1)
Basophils Relative: 1 %
Eosinophils Absolute: 0.1 10*3/uL (ref 0.0–0.5)
Eosinophils Relative: 2 %
HCT: 46.7 % (ref 39.0–52.0)
Hemoglobin: 15.9 g/dL (ref 13.0–17.0)
Immature Granulocytes: 0 %
Lymphocytes Relative: 35 %
Lymphs Abs: 2.6 10*3/uL (ref 0.7–4.0)
MCH: 30.3 pg (ref 26.0–34.0)
MCHC: 34 g/dL (ref 30.0–36.0)
MCV: 89.1 fL (ref 80.0–100.0)
Monocytes Absolute: 0.6 10*3/uL (ref 0.1–1.0)
Monocytes Relative: 8 %
Neutro Abs: 4 10*3/uL (ref 1.7–7.7)
Neutrophils Relative %: 54 %
Platelets: 199 10*3/uL (ref 150–400)
RBC: 5.24 MIL/uL (ref 4.22–5.81)
RDW: 12.7 % (ref 11.5–15.5)
WBC: 7.4 10*3/uL (ref 4.0–10.5)
nRBC: 0 % (ref 0.0–0.2)

## 2020-02-23 MED ORDER — IOHEXOL 300 MG/ML  SOLN
100.0000 mL | Freq: Once | INTRAMUSCULAR | Status: AC | PRN
  Administered 2020-02-23: 100 mL via INTRAVENOUS

## 2020-02-24 LAB — CEA: CEA: 1.4 ng/mL (ref 0.0–4.7)

## 2020-02-29 ENCOUNTER — Other Ambulatory Visit: Payer: Self-pay

## 2020-02-29 ENCOUNTER — Inpatient Hospital Stay (HOSPITAL_COMMUNITY): Payer: Medicare HMO | Admitting: Hematology

## 2020-02-29 VITALS — BP 164/72 | HR 61 | Temp 96.9°F | Resp 18 | Wt 173.5 lb

## 2020-02-29 DIAGNOSIS — C182 Malignant neoplasm of ascending colon: Secondary | ICD-10-CM

## 2020-02-29 DIAGNOSIS — Z85038 Personal history of other malignant neoplasm of large intestine: Secondary | ICD-10-CM | POA: Diagnosis not present

## 2020-02-29 DIAGNOSIS — D500 Iron deficiency anemia secondary to blood loss (chronic): Secondary | ICD-10-CM | POA: Diagnosis not present

## 2020-02-29 NOTE — Progress Notes (Signed)
[place], [place] [address]   CLINIC:  Medical Oncology/Hematology  PCP:  Jani Gravel, MD [address] [place] [address] [phone]   REASON FOR VISIT:  Follow-up for stage II colon cancer and IDA  PRIOR THERAPY: Right hemicolectomy on 11/10/2017  NGS Results: MSI--stable  CURRENT THERAPY: Observation  BRIEF ONCOLOGIC HISTORY:  Oncology History  Colon cancer ([place])  10/28/2017 Initial Diagnosis   Colon cancer ([place])   11/24/2017 Cancer Staging   Staging form: Colon and Rectum, AJCC 8th Edition - Clinical: Stage IIA (cT3, cN0, cM0) - Signed by [name], MD on 11/24/2017     CANCER STAGING: Cancer Staging Colon cancer [facility]) Staging form: Colon and Rectum, AJCC 8th Edition - Clinical: Stage IIA (cT3, cN0, cM0) - Signed by [name], MD on 11/24/2017   INTERVAL HISTORY:  Mr. ANDDY WINGERT, a 64 y.o. male, returns for routine follow-up of his stage II colon cancer and IDA. Gurjot was last seen on 11/03/2019.  Today he reports feeling well. He denies seeing any hematochezia or hematuria. He has regular BM's daily and exercises by walking 2-3 miles daily.   REVIEW OF SYSTEMS:  Review of Systems  Constitutional: Negative for appetite change and fatigue.  Gastrointestinal: Negative for blood in stool.  Genitourinary: Negative for hematuria.   All other systems reviewed and are negative.   PAST MEDICAL/SURGICAL HISTORY:  Past Medical History:  Diagnosis Date  . Anemia   . Cancer [facility])    Ascending colon s/p right hemicolectomy on 11/10/2017.   Marland Kitchen GERD (gastroesophageal reflux disease)   . Hyperlipidemia   . Lower back pain    Past Surgical History:  Procedure Laterality Date  . BACK SURGERY    . BIOPSY  10/17/2017   Procedure: BIOPSY;  Surgeon: Daneil Dolin, MD;  [facility];  Service: Endoscopy;;  . COLON RESECTION N/A 11/10/2017   Procedure: LAPAROSCOPIC PARTIAL  COLECTOMY;  Surgeon: [name], MD;  Location: [facility] ORS;  Service: General;  Laterality: N/A;  . COLONOSCOPY N/A 10/17/2017   Procedure: COLONOSCOPY;  Surgeon: Daneil Dolin, MD; bulky apple core tumor in the vicinity of a sending colon.  Diverticulosis in sigmoid and descending colon.  Pathology with invasive adenocarcinoma.  . COLONOSCOPY N/A 07/28/2019   Procedure: COLONOSCOPY;  Surgeon: Daneil Dolin, MD;  [facility];  Service: Endoscopy;  Laterality: N/A;  2:15pm  . POLYPECTOMY  07/28/2019   Procedure: POLYPECTOMY;  Surgeon: Daneil Dolin, MD;  [facility];  Service: Endoscopy;;    SOCIAL HISTORY:  Social History   Socioeconomic History  . Marital status: Single    Spouse name: Not on file  . Number of children: Not on file  . Years of education: Not on file  . Highest education level: Not on file  Occupational History  . Not on file  Tobacco Use  . Smoking status: Never Smoker  . Smokeless tobacco: Former Systems developer    Types: Secondary school teacher  . Vaping Use: Never used  Substance and Sexual Activity  . Alcohol use: No  . Drug use: No  . Sexual activity: Not on file  Other Topics Concern  . Not on file  Social History Narrative  . Not on file   Social Determinants of Health   Financial Resource Strain:   . Difficulty of Paying Living Expenses: Not on file  Food Insecurity:   .  Worried About Charity fundraiser in the Last Year: Not on file  . Ran Out of Food in the Last Year: Not on file  Transportation Needs:   . Lack of Transportation (Medical): Not on file  . Lack of Transportation (Non-Medical): Not on file  Physical Activity:   . Days of Exercise per Week: Not on file  . Minutes of Exercise per Session: Not on file  Stress:   . Feeling of Stress : Not on file  Social Connections:   . Frequency of Communication with Friends and Family: Not on file  . Frequency of Social Gatherings with Friends and Family: Not on file  . Attends  Religious Services: Not on file  . Active Member of Clubs or Organizations: Not on file  . Attends Archivist Meetings: Not on file  . Marital Status: Not on file  Intimate Partner Violence:   . Fear of Current or Ex-Partner: Not on file  . Emotionally Abused: Not on file  . Physically Abused: Not on file  . Sexually Abused: Not on file    FAMILY HISTORY:  Family History  Problem Relation Age of Onset  . Stroke Mother   . Alcohol abuse Father   . Down syndrome Sister   . Alcohol abuse Brother   . Colon cancer Neg Hx   . Ulcers Neg Hx     CURRENT MEDICATIONS:  Current Outpatient Medications  Medication Sig Dispense Refill  . diphenhydrAMINE (BENADRYL) 25 MG tablet Take 25 mg by mouth every 6 (six) hours as needed for allergies.    . Omega-3 Fatty Acids (FISH OIL PO) Take 1 capsule by mouth 4 (four) times a week.     . rosuvastatin (CRESTOR) 10 MG tablet SMARTSIG:1 Tablet(s) By Mouth Every Evening    . rosuvastatin (CRESTOR) 20 MG tablet Take 20 mg by mouth daily.     No current facility-administered medications for this visit.    ALLERGIES:  No Known Allergies  PHYSICAL EXAM:  Performance status (ECOG): 1 - Symptomatic but completely ambulatory  Vitals:   02/29/20 1548  BP: (!) 164/72  Pulse: 61  Resp: 18  Temp: (!) 96.9 F (36.1 C)  SpO2: 100%   Wt Readings from Last 3 Encounters:  02/29/20 173 lb 8 oz (78.7 kg)  02/09/20 170 lb (77.1 kg)  11/03/19 172 lb (78 kg)   Physical Exam   LABORATORY DATA:  I have reviewed the labs as listed.  CBC Latest Ref Rng & Units 02/23/2020 10/28/2019 07/27/2019  WBC 4.0 - 10.5 K/uL 7.4 6.9 7.7  Hemoglobin 13.0 - 17.0 g/dL 15.9 16.2 15.9  Hematocrit 39 - 52 % 46.7 48.1 46.6  Platelets 150 - 400 K/uL 199 179 200   CMP Latest Ref Rng & Units 02/23/2020 10/28/2019 07/27/2019  Glucose 70 - 99 mg/dL 102(H) 102(H) 92  BUN 8 - 23 mg/dL $Remove'11 10 9  'clJpeGp$ Creatinine 0.61 - 1.24 mg/dL 0.88 0.92 0.85  Sodium 135 - 145 mmol/L 133(L)  138 134(L)  Potassium 3.5 - 5.1 mmol/L 3.8 4.2 4.1  Chloride 98 - 111 mmol/L 99 101 101  CO2 22 - 32 mmol/L $RemoveB'24 24 25  'fcykbEGZ$ Calcium 8.9 - 10.3 mg/dL 9.3 9.3 9.1  Total Protein 6.5 - 8.1 g/dL 7.2 7.3 7.1  Total Bilirubin 0.3 - 1.2 mg/dL 1.3(H) 1.1 0.9  Alkaline Phos 38 - 126 U/L 68 81 80  AST 15 - 41 U/L $Remo'22 24 23  'RKWyF$ ALT 0 - 44 U/L 22 26  28   Lab Results  Component Value Date   CEA1 1.4 02/23/2020   CEA1 1.5 10/28/2019   CEA1 1.2 07/27/2019    DIAGNOSTIC IMAGING:  I have independently reviewed the scans and discussed with the patient. CT ABDOMEN W WO CONTRAST  Result Date: 02/23/2020 CLINICAL DATA:  Follow-up right renal mass. EXAM: CT ABDOMEN WITHOUT AND WITH CONTRAST TECHNIQUE: Multidetector CT imaging of the abdomen was performed following the standard protocol before and following the bolus administration of intravenous contrast. CONTRAST:  128mL OMNIPAQUE IOHEXOL 300 MG/ML  SOLN COMPARISON:  MRI 07/30/2019 FINDINGS: Lower chest: No acute abnormality. Hepatobiliary: Again seen is a focal geographic area of low attenuation within segment 4 compatible with focal fatty deposition as demonstrated on previous MRI, image 30/4. No suspicious liver lesion. The gallbladder is normal. No biliary dilatation. Pancreas: Unremarkable. No pancreatic ductal dilatation or surrounding inflammatory changes. Spleen: Normal in size without focal abnormality. Adrenals/Urinary Tract: Normal appearance of the adrenal glands. Previously characterized solid enhancing right kidney mass is again noted measuring 1.4 x 1.6 cm, image 59/4. On 04/28/2019 this measured 1.3 x 1.4 cm. New small low-density structure in the posterior cortex of the left inferior pole measures 5 mm, image 62/4. This is too small to reliably characterize. No hydronephrosis identified bilaterally. Stomach/Bowel: Stomach is within normal limits. No evidence of bowel wall thickening, distention, or inflammatory changes. Vascular/Lymphatic: Aortic  atherosclerosis. No aneurysm. No signs of abdominal adenopathy. Other: No free fluid or fluid collections. No signs of peritoneal disease. Musculoskeletal: No acute or significant osseous findings. IMPRESSION: 1. Mild increase in size of solid enhancing right kidney mass compatible with renal cell carcinoma. 2. New small low-density structure in the posterior cortex of the left inferior pole is too small to reliably characterize. 3. Aortic atherosclerosis. Aortic Atherosclerosis (ICD10-I70.0). Electronically Signed   By: Kerby Moors M.D.   On: 02/23/2020 20:25     ASSESSMENT:  1.  Stage II (T3N0) colon cancer: -Right hemicolectomy on 11/10/2017, MSI stable. -Colonoscopy on 07/28/2019 showed 5 mm polyp in the sigmoid colon consistent with tubular adenoma.  Exam otherwise normal. -MRI of the abdomen on 07/30/2019 did not show any evidence of metastatic disease.  CT scanning on 04/28/2019 also showed stable exam. -CTAP on 02/23/2020 did not show any evidence of metastatic disease.  2.  Right kidney mass: -MRI of the abdomen on 07/30/2019 showed 12 x 13 mm lesion in the posterior right lower kidney.  This is grossly unchanged from CT from 04/28/2019. -CT abdomen with and without contrast on 02/23/2020 showed right kidney solid enhancing mass measuring 1.4 x 1.6 cm.  New small low-density structure in the posterior cortex of the left inferior pole measures 5 mm, too small to characterize.   PLAN:  1.  Stage II colon cancer: -No change in bowel habits or bleeding per rectum. -CEA is 1.4.  LFTs are normal. -RTC 6 months with repeat labs.  Imaging in 1 year.  2.  Right kidney mass: -I reviewed findings on the CT scan of the abdomen which showed more or less stable kidney mass. -He is already seen Dr. Alyson Ingles who ordered ultrasound in the next few months.   Orders placed this encounter:  No orders of the defined types were placed in this encounter.    [name], MD [place] [phone]   I, Milinda Antis, am acting as a scribe for Dr. Sanda Linger.  I, [name] MD, have reviewed the above documentation for accuracy  and completeness, and I agree with the above.

## 2020-02-29 NOTE — Patient Instructions (Signed)
[facility] at [facility] Discharge Instructions  You were seen today by Dr. Katragadda. He went over your recent results. Dr. Katragadda will see you back in 6 months for labs and follow up.   Thank you for choosing [facility] at [facility] to provide your oncology and hematology care.  To afford each patient quality time with our provider, please arrive at least 15 minutes before your scheduled appointment time.   If you have a lab appointment with the Cancer Center please come in thru the Main Entrance and check in at the main information desk  You need to re-schedule your appointment should you arrive 10 or more minutes late.  We strive to give you quality time with our providers, and arriving late affects you and other patients whose appointments are after yours.  Also, if you no show three or more times for appointments you may be dismissed from the clinic at the providers discretion.     Again, thank you for choosing [facility].  Our hope is that these requests will decrease the amount of time that you wait before being seen by our physicians.       _____________________________________________________________  Should you have questions after your visit to [facility], please contact our office at [phone] between the hours of 8:00 a.m. and 4:30 p.m.  Voicemails left after 4:00 p.m. will not be returned until the following business day.  For prescription refill requests, have your pharmacy contact our office and allow 72 hours.    Cancer Center Support Programs:   > Cancer Support Group  2nd Tuesday of the month 1pm-2pm, Journey Room    

## 2020-08-09 ENCOUNTER — Ambulatory Visit: Payer: Medicare HMO | Admitting: Urology

## 2020-08-10 ENCOUNTER — Ambulatory Visit: Payer: Medicare HMO | Admitting: Urology

## 2020-08-28 ENCOUNTER — Other Ambulatory Visit: Payer: Self-pay

## 2020-08-28 ENCOUNTER — Inpatient Hospital Stay (HOSPITAL_COMMUNITY): Payer: Medicare HMO | Attending: Hematology

## 2020-08-28 DIAGNOSIS — Z85038 Personal history of other malignant neoplasm of large intestine: Secondary | ICD-10-CM | POA: Diagnosis not present

## 2020-08-28 DIAGNOSIS — N2889 Other specified disorders of kidney and ureter: Secondary | ICD-10-CM | POA: Diagnosis not present

## 2020-08-28 DIAGNOSIS — M549 Dorsalgia, unspecified: Secondary | ICD-10-CM | POA: Insufficient documentation

## 2020-08-28 DIAGNOSIS — Z79899 Other long term (current) drug therapy: Secondary | ICD-10-CM | POA: Insufficient documentation

## 2020-08-28 DIAGNOSIS — E785 Hyperlipidemia, unspecified: Secondary | ICD-10-CM | POA: Diagnosis not present

## 2020-08-28 DIAGNOSIS — K219 Gastro-esophageal reflux disease without esophagitis: Secondary | ICD-10-CM | POA: Diagnosis not present

## 2020-08-28 DIAGNOSIS — D500 Iron deficiency anemia secondary to blood loss (chronic): Secondary | ICD-10-CM

## 2020-08-28 DIAGNOSIS — K635 Polyp of colon: Secondary | ICD-10-CM | POA: Diagnosis not present

## 2020-08-28 DIAGNOSIS — Z9049 Acquired absence of other specified parts of digestive tract: Secondary | ICD-10-CM | POA: Diagnosis not present

## 2020-08-28 DIAGNOSIS — C182 Malignant neoplasm of ascending colon: Secondary | ICD-10-CM

## 2020-08-28 LAB — COMPREHENSIVE METABOLIC PANEL
ALT: 23 U/L (ref 0–44)
AST: 23 U/L (ref 15–41)
Albumin: 4.7 g/dL (ref 3.5–5.0)
Alkaline Phosphatase: 70 U/L (ref 38–126)
Anion gap: 10 (ref 5–15)
BUN: 14 mg/dL (ref 8–23)
CO2: 23 mmol/L (ref 22–32)
Calcium: 9.4 mg/dL (ref 8.9–10.3)
Chloride: 104 mmol/L (ref 98–111)
Creatinine, Ser: 0.89 mg/dL (ref 0.61–1.24)
GFR, Estimated: >60 mL/min (ref >60)
Glucose, Bld: 106 mg/dL — ABNORMAL HIGH (ref 70–99)
Potassium: 4.5 mmol/L (ref 3.5–5.1)
Sodium: 137 mmol/L (ref 135–145)
Total Bilirubin: 0.8 mg/dL (ref 0.3–1.2)
Total Protein: 7.3 g/dL (ref 6.5–8.1)

## 2020-08-28 LAB — CBC WITH DIFFERENTIAL/PLATELET
Abs Immature Granulocytes: 0.02 10*3/uL (ref 0.00–0.07)
Basophils Absolute: 0.1 10*3/uL (ref 0.0–0.1)
Basophils Relative: 1 %
Eosinophils Absolute: 0.3 10*3/uL (ref 0.0–0.5)
Eosinophils Relative: 3 %
HCT: 49.5 % (ref 39.0–52.0)
Hemoglobin: 16.4 g/dL (ref 13.0–17.0)
Immature Granulocytes: 0 %
Lymphocytes Relative: 41 %
Lymphs Abs: 3 10*3/uL (ref 0.7–4.0)
MCH: 30 pg (ref 26.0–34.0)
MCHC: 33.1 g/dL (ref 30.0–36.0)
MCV: 90.5 fL (ref 80.0–100.0)
Monocytes Absolute: 0.6 10*3/uL (ref 0.1–1.0)
Monocytes Relative: 8 %
Neutro Abs: 3.5 10*3/uL (ref 1.7–7.7)
Neutrophils Relative %: 47 %
Platelets: 190 10*3/uL (ref 150–400)
RBC: 5.47 MIL/uL (ref 4.22–5.81)
RDW: 12.4 % (ref 11.5–15.5)
WBC: 7.5 10*3/uL (ref 4.0–10.5)
nRBC: 0 % (ref 0.0–0.2)

## 2020-08-29 LAB — CEA: CEA: 1 ng/mL (ref 0.0–4.7)

## 2020-09-04 ENCOUNTER — Other Ambulatory Visit: Payer: Self-pay

## 2020-09-04 ENCOUNTER — Inpatient Hospital Stay (HOSPITAL_COMMUNITY): Payer: Medicare HMO | Admitting: Hematology

## 2020-09-04 VITALS — BP 159/73 | HR 62 | Temp 96.8°F | Resp 18 | Wt 176.0 lb

## 2020-09-04 DIAGNOSIS — C182 Malignant neoplasm of ascending colon: Secondary | ICD-10-CM | POA: Diagnosis not present

## 2020-09-04 DIAGNOSIS — D500 Iron deficiency anemia secondary to blood loss (chronic): Secondary | ICD-10-CM | POA: Diagnosis not present

## 2020-09-04 DIAGNOSIS — Z85038 Personal history of other malignant neoplasm of large intestine: Secondary | ICD-10-CM | POA: Diagnosis not present

## 2020-09-04 NOTE — Patient Instructions (Signed)
[facility] at [facility] Discharge Instructions  You were seen today by Dr. Katragadda. He went over your recent results and scans. You will be scheduled to have a CT scan of your abdomen done before your next visit. Dr. Katragadda will see you back in 6 months for labs and follow up.   Thank you for choosing [facility] at [facility] to provide your oncology and hematology care.  To afford each patient quality time with our provider, please arrive at least 15 minutes before your scheduled appointment time.   If you have a lab appointment with the Cancer Center please come in thru the Main Entrance and check in at the main information desk  You need to re-schedule your appointment should you arrive 10 or more minutes late.  We strive to give you quality time with our providers, and arriving late affects you and other patients whose appointments are after yours.  Also, if you no show three or more times for appointments you may be dismissed from the clinic at the providers discretion.     Again, thank you for choosing [facility].  Our hope is that these requests will decrease the amount of time that you wait before being seen by our physicians.       _____________________________________________________________  Should you have questions after your visit to [facility], please contact our office at [phone] between the hours of 8:00 a.m. and 4:30 p.m.  Voicemails left after 4:00 p.m. will not be returned until the following business day.  For prescription refill requests, have your pharmacy contact our office and allow 72 hours.    Cancer Center Support Programs:   > Cancer Support Group  2nd Tuesday of the month 1pm-2pm, Journey Room    

## 2020-09-04 NOTE — Progress Notes (Signed)
[place], [place] [address]   CLINIC:  Medical Oncology/Hematology  PCP:  Jani Gravel, MD [address] Cathie Beams [place] [address] [phone]   REASON FOR VISIT:  Follow-up for stage II colon cancer and IDA  PRIOR THERAPY:  1. Right hemicolectomy on 11/10/2017. 2. Intermittent Feraheme last on 12/08/2017.  NGS Results: Foundation 1 MSI--stable  CURRENT THERAPY: Surveillance  BRIEF ONCOLOGIC HISTORY:  Oncology History  Colon cancer ([place])  10/28/2017 Initial Diagnosis   Colon cancer ([place])   11/24/2017 Cancer Staging   Staging form: Colon and Rectum, AJCC 8th Edition - Clinical: Stage IIA (cT3, cN0, cM0) - Signed by [name], MD on 11/24/2017     CANCER STAGING: Cancer Staging Colon cancer [facility]) Staging form: Colon and Rectum, AJCC 8th Edition - Clinical: Stage IIA (cT3, cN0, cM0) - Signed by [name], MD on 11/24/2017   INTERVAL HISTORY:  [name], a 65 y.o. male, returns for routine follow-up of his stage II colon cancer and IDA. Fiore was last seen on 02/29/2020.   Today he reports feeling well. He complains of having chronic back pain. He denies having N/V/D/C, melena, hematochezia or hematuria. His kidney mass is being followed by Dr. Alyson Ingles.  He is scheduled to follow-up with Dr. Alyson Ingles on 04/05.   REVIEW OF SYSTEMS:  Review of Systems  Constitutional: Negative for appetite change and fatigue.  Gastrointestinal: Negative for blood in stool, constipation, diarrhea, nausea and vomiting.  Genitourinary: Negative for hematuria.   Musculoskeletal: Positive for back pain (chronic lower back pain) and myalgias (3/10 generalized pain).  All other systems reviewed and are negative.   PAST MEDICAL/SURGICAL HISTORY:  Past Medical History:  Diagnosis Date  . Anemia   . Cancer [facility])    Ascending colon s/p right hemicolectomy on 11/10/2017.   Marland Kitchen GERD (gastroesophageal reflux  disease)   . Hyperlipidemia   . Lower back pain    Past Surgical History:  Procedure Laterality Date  . BACK SURGERY    . BIOPSY  10/17/2017   Procedure: BIOPSY;  Surgeon: Daneil Dolin, MD;  [facility];  Service: Endoscopy;;  . COLON RESECTION N/A 11/10/2017   Procedure: LAPAROSCOPIC PARTIAL COLECTOMY;  Surgeon: [name], MD;  Location: [facility] ORS;  Service: General;  Laterality: N/A;  . COLONOSCOPY N/A 10/17/2017   Procedure: COLONOSCOPY;  Surgeon: Daneil Dolin, MD; bulky apple core tumor in the vicinity of a sending colon.  Diverticulosis in sigmoid and descending colon.  Pathology with invasive adenocarcinoma.  . COLONOSCOPY N/A 07/28/2019   Procedure: COLONOSCOPY;  Surgeon: Daneil Dolin, MD;  [facility];  Service: Endoscopy;  Laterality: N/A;  2:15pm  . POLYPECTOMY  07/28/2019   Procedure: POLYPECTOMY;  Surgeon: Daneil Dolin, MD;  [facility];  Service: Endoscopy;;    SOCIAL HISTORY:  Social History   Socioeconomic History  . Marital status: Single    Spouse name: Not on file  . Number of children: Not on file  . Years of education: Not on file  . Highest education level: Not on file  Occupational History  . Not on file  Tobacco Use  . Smoking status: Never Smoker  . Smokeless tobacco: Former Systems developer    Types: Secondary school teacher  . Vaping Use: Never used  Substance and Sexual Activity  . Alcohol use: No  . Drug use: No  . Sexual activity: Not on  file  Other Topics Concern  . Not on file  Social History Narrative  . Not on file   Social Determinants of Health   Financial Resource Strain: Not on file  Food Insecurity: Not on file  Transportation Needs: Not on file  Physical Activity: Not on file  Stress: Not on file  Social Connections: Not on file  Intimate Partner Violence: Not on file    FAMILY HISTORY:  Family History  Problem Relation Age of Onset  . Stroke Mother   . Alcohol abuse Father   . Down  syndrome Sister   . Alcohol abuse Brother   . Colon cancer Neg Hx   . Ulcers Neg Hx     CURRENT MEDICATIONS:  Current Outpatient Medications  Medication Sig Dispense Refill  . diphenhydrAMINE (BENADRYL) 25 MG tablet Take 25 mg by mouth every 6 (six) hours as needed for allergies.    . Omega-3 Fatty Acids (FISH OIL PO) Take 1 capsule by mouth 4 (four) times a week.     . rosuvastatin (CRESTOR) 10 MG tablet SMARTSIG:1 Tablet(s) By Mouth Every Evening    . rosuvastatin (CRESTOR) 20 MG tablet Take 20 mg by mouth daily.     No current facility-administered medications for this visit.    ALLERGIES:  No Known Allergies  PHYSICAL EXAM:  Performance status (ECOG): 1 - Symptomatic but completely ambulatory  Vitals:   09/04/20 1550  BP: (!) 159/73  Pulse: 62  Resp: 18  Temp: (!) 96.8 F (36 C)  SpO2: 99%   Wt Readings from Last 3 Encounters:  09/04/20 176 lb (79.8 kg)  02/29/20 173 lb 8 oz (78.7 kg)  02/09/20 170 lb (77.1 kg)   Physical Exam Vitals reviewed.  Constitutional:      Appearance: Normal appearance.  Cardiovascular:     Rate and Rhythm: Normal rate and regular rhythm.     Pulses: Normal pulses.     Heart sounds: Normal heart sounds.  Pulmonary:     Effort: Pulmonary effort is normal.     Breath sounds: Normal breath sounds.  Abdominal:     Palpations: Abdomen is soft. There is no hepatomegaly, splenomegaly or mass.     Tenderness: There is no abdominal tenderness.     Hernia: No hernia is present.  Musculoskeletal:     Right lower leg: No edema.     Left lower leg: No edema.  Neurological:     General: No focal deficit present.     Mental Status: He is alert and oriented to person, place, and time.  Psychiatric:        Mood and Affect: Mood normal.        Behavior: Behavior normal.      LABORATORY DATA:  I have reviewed the labs as listed.  CBC Latest Ref Rng & Units 08/28/2020 02/23/2020 10/28/2019  WBC 4.0 - 10.5 K/uL 7.5 7.4 6.9  Hemoglobin 13.0 -  17.0 g/dL 16.4 15.9 16.2  Hematocrit 39.0 - 52.0 % 49.5 46.7 48.1  Platelets 150 - 400 K/uL 190 199 179   CMP Latest Ref Rng & Units 08/28/2020 02/23/2020 10/28/2019  Glucose 70 - 99 mg/dL 106(H) 102(H) 102(H)  BUN 8 - 23 mg/dL _0 Creatinine 0.61 - 1.24 mg/dL 0.89 0.88 0.92  Sodium 135 - 145 mmol/L 137 133(L) 138  Potassium 3.5 - 5.1 mmol/L 4.5 3.8 4.2  Chloride 98 - 111 mmol/L 104 99 101  CO2 22 - 32 mmol/L 23 24 24  Calcium 8.9 - 10.3 mg/dL 9.4 9.3 9.3  Total Protein 6.5 - 8.1 g/dL 7.3 7.2 7.3  Total Bilirubin 0.3 - 1.2 mg/dL 0.8 1.3(H) 1.1  Alkaline Phos 38 - 126 U/L 70 68 81  AST 15 - 41 U/L _0 ALT 0 - 44 U/L _1 Lab Results  Component Value Date   CEA1 1.0 08/28/2020   CEA1 1.4 02/23/2020   CEA1 1.5 10/28/2019    DIAGNOSTIC IMAGING:  I have independently reviewed the scans and discussed with the patient. No results found.   ASSESSMENT:  1.  Stage II (T3N0) colon cancer: -Right hemicolectomy on 11/10/2017, MSI stable. -Colonoscopy on 07/28/2019 showed 5 mm polyp in the sigmoid colon consistent with tubular adenoma. Exam otherwise normal. -MRI of the abdomen on 07/30/2019 did not show any evidence of metastatic disease. CT scanning on 04/28/2019 also showed stable exam. -CTAP on 02/23/2020 did not show any evidence of metastatic disease.  2.  Right kidney mass: -MRI of the abdomen on 07/30/2019 showed 12 x 13 mm lesion in the posterior right lower kidney.  This is grossly unchanged from CT from 04/28/2019. -CT abdomen with and without contrast on 02/23/2020 showed right kidney solid enhancing mass measuring 1.4 x 1.6 cm.  New small low-density structure in the posterior cortex of the left inferior pole measures 5 mm, too small to characterize.   PLAN:  1.  Stage II colon cancer: -He denies any change in bowel habits.  Denies any bleeding per rectum or melena. -Reviewed his labs from 08/28/2020 which showed normal LFTs.  CEA was 1.0. -Physical examination  was benign. -RTC 6 months with repeat CTAP and CEA level.  2. Right kidney mass: -CT abdomen with and without contrast on 02/23/2020 showed stable right kidney mass consistent with RCC. -Continue follow-ups with Dr. Alyson Ingles.   Orders placed this encounter:  No orders of the defined types were placed in this encounter.    [name], MD [place] [phone]   I, Milinda Antis, am acting as a scribe for Dr. Sanda Linger.  I, [name] MD, have reviewed the above documentation for accuracy and completeness, and I agree with the above.

## 2020-09-12 ENCOUNTER — Other Ambulatory Visit: Payer: Self-pay

## 2020-09-12 ENCOUNTER — Ambulatory Visit (HOSPITAL_COMMUNITY)
Admission: RE | Admit: 2020-09-12 | Discharge: 2020-09-12 | Disposition: A | Discharge status: Home / Self Care | Payer: Medicare HMO | Source: Ambulatory Visit | Attending: Urology | Admitting: Urology

## 2020-09-12 ENCOUNTER — Ambulatory Visit (INDEPENDENT_AMBULATORY_CARE_PROVIDER_SITE_OTHER): Payer: Medicare HMO | Admitting: Urology

## 2020-09-12 ENCOUNTER — Encounter: Payer: Self-pay | Admitting: Urology

## 2020-09-12 VITALS — BP 161/89 | HR 68 | Temp 98.3°F

## 2020-09-12 DIAGNOSIS — Z85528 Personal history of other malignant neoplasm of kidney: Secondary | ICD-10-CM | POA: Diagnosis present

## 2020-09-12 LAB — URINALYSIS, ROUTINE W REFLEX MICROSCOPIC
Bilirubin, UA: NEGATIVE
Glucose, UA: NEGATIVE
Ketones, UA: NEGATIVE
Leukocytes,UA: NEGATIVE
Nitrite, UA: NEGATIVE
Protein,UA: NEGATIVE
RBC, UA: NEGATIVE
Specific Gravity, UA: <1.005 — ABNORMAL LOW (ref 1.005–1.030)
Urobilinogen, Ur: 0.2 mg/dL (ref 0.2–1.0)
pH, UA: 6 (ref 5.0–7.5)

## 2020-09-12 NOTE — Patient Instructions (Signed)

## 2020-09-12 NOTE — Progress Notes (Signed)
09/12/2020 3:38 PM   Jack Gibbs 08-23-1955 [id number]  Referring provider: Jani Gravel, MD [address] [place],  [place] [address]  Right renal US  HPI: Jack Gibbs is a 65yo here for followup for a right renal mass. Renal US today shows 1.9cm right renal mass. previous CT showed the mass to be 1.6cm. No flank pain. No other complaints today  PMH: Past Medical History:  Diagnosis Date  . Anemia   . Cancer [facility])    Ascending colon s/p right hemicolectomy on 11/10/2017.   Marland Kitchen GERD (gastroesophageal reflux disease)   . Hyperlipidemia   . Lower back pain     Surgical History: Past Surgical History:  Procedure Laterality Date  . BACK SURGERY    . BIOPSY  10/17/2017   Procedure: BIOPSY;  Surgeon: Jack Dolin, MD;  [facility];  Service: Endoscopy;;  . COLON RESECTION N/A 11/10/2017   Procedure: LAPAROSCOPIC PARTIAL COLECTOMY;  Surgeon: Jack Cagey, MD;  Location: [facility];  Service: General;  Laterality: N/A;  . COLONOSCOPY N/A 10/17/2017   Procedure: COLONOSCOPY;  Surgeon: Jack Dolin, MD; bulky apple core tumor in the vicinity of a sending colon.  Diverticulosis in sigmoid and descending colon.  Pathology with invasive adenocarcinoma.  . COLONOSCOPY N/A 07/28/2019   Procedure: COLONOSCOPY;  Surgeon: Jack Dolin, MD;  [facility];  Service: Endoscopy;  Laterality: N/A;  2:15pm  . POLYPECTOMY  07/28/2019   Procedure: POLYPECTOMY;  Surgeon: Jack Dolin, MD;  [facility];  Service: Endoscopy;;    Home Medications:  Allergies as of 09/12/2020   No Known Allergies     Medication List       Accurate as of September 12, 2020  3:38 PM. If you have any questions, ask your nurse or doctor.        diphenhydrAMINE 25 MG tablet Commonly known as: BENADRYL Take 25 mg by mouth every 6 (six) hours as needed for allergies.   FISH OIL PO Take 1 capsule by mouth 4 (four) times a week.   rosuvastatin 10 MG  tablet Commonly known as: CRESTOR SMARTSIG:1 Tablet(s) By Mouth Every Evening   rosuvastatin 20 MG tablet Commonly known as: CRESTOR Take 20 mg by mouth daily.       Allergies: No Known Allergies  Family History: Family History  Problem Relation Age of Onset  . Stroke Mother   . Alcohol abuse Father   . Down syndrome Sister   . Alcohol abuse Brother   . Colon cancer Neg Hx   . Ulcers Neg Hx     Social History:  reports that he has never smoked. He quit smokeless tobacco use about 41 years ago.  His smokeless tobacco use included chew. He reports that he does not drink alcohol and does not use drugs.  ROS: All other review of systems were reviewed and are negative except what is noted above in HPI  Physical Exam: BP (!) 161/89   Pulse 68   Temp 98.3 F (36.8 C)   Constitutional:  Alert and oriented, No acute distress. HEENT: Jack Gibbs, moist mucus membranes.  Trachea midline, no masses. Cardiovascular: No clubbing, cyanosis, or edema. Respiratory: Normal respiratory effort, no increased work of breathing. GI: Abdomen is soft, nontender, nondistended, no abdominal masses GU: No CVA tenderness.  Lymph: No cervical or inguinal lymphadenopathy. Skin: No rashes, bruises or suspicious lesions. Neurologic: Grossly intact, no focal deficits, moving all 4 extremities.  Psychiatric: Normal mood and affect.  Laboratory Data: Lab Results  Component Value Date   WBC 7.5 08/28/2020   HGB 16.4 08/28/2020   HCT 49.5 08/28/2020   MCV 90.5 08/28/2020   PLT 190 08/28/2020    Lab Results  Component Value Date   CREATININE 0.89 08/28/2020    No results found for: PSA  No results found for: TESTOSTERONE  No results found for: HGBA1C  Urinalysis    Component Value Date/Time   APPEARANCEUR Clear 02/09/2020 1010   GLUCOSEU Negative 02/09/2020 1010   BILIRUBINUR Negative 02/09/2020 1010   PROTEINUR Negative 02/09/2020 1010   NITRITE Negative 02/09/2020 1010   LEUKOCYTESUR  Negative 02/09/2020 1010    Lab Results  Component Value Date   LABMICR Comment 02/09/2020    Pertinent Imaging: Renal US today: Images reviewed and discussed with the patient No results found for this or any previous visit.  No results found for this or any previous visit.  No results found for this or any previous visit.  No results found for this or any previous visit.  Results for orders placed during the hospital encounter of 02/04/19  US RENAL  Narrative CLINICAL DATA:  65 year old male with history of renal cell carcinoma.  EXAM: RENAL / URINARY TRACT ULTRASOUND COMPLETE  COMPARISON:  CT of the abdomen pelvis dated 04/28/2018 and abdominal MRI dated 10/29/2017  FINDINGS: Right Kidney:  Renal measurements: 50.7 x 4.6 x 4.8 cm = volume: 124 mL. Normal echogenicity. No hydronephrosis or shadowing stone. The mass seen on the prior CT and MRI is not visualized on this ultrasound.  Left Kidney:  Renal measurements: 10.5 x 5.7 x 5.1 cm = volume: 160 mL. Normal echogenicity. No hydronephrosis or shadowing stone.  Bladder:  Appears normal for degree of bladder distention.  IMPRESSION: Unremarkable renal ultrasound. Nonvisualization of the known right renal mass on this ultrasound.   Electronically Signed By: Anner Crete M.D. On: 02/04/2019 19:16  No results found for this or any previous visit.  No results found for this or any previous visit.  No results found for this or any previous visit.   Assessment & Plan:    1. History of kidney cancer -RTC 6 months with CT abd - Urinalysis, Routine w reflex microscopic   No follow-ups on file.  Jack Bang, MD  [facility] [place]

## 2020-09-12 NOTE — Progress Notes (Signed)
Urological Symptom Review  Patient is experiencing the following symptoms: none   Review of Systems  Gastrointestinal (upper)  : Negative for upper GI symptoms  Gastrointestinal (lower) : Negative for lower GI symptoms  Constitutional : Negative for symptoms  Skin: Negative for skin symptoms  Eyes: Negative for eye symptoms  Ear/Nose/Throat : Negative for Ear/Nose/Throat symptoms  Hematologic/Lymphatic: Negative for Hematologic/Lymphatic symptoms  Cardiovascular : Negative for cardiovascular symptoms  Respiratory : Negative for respiratory symptoms  Endocrine: Negative for endocrine symptoms  Musculoskeletal: Back pain  Neurological: Negative for neurological symptoms  Psychologic: Negative for psychiatric symptoms

## 2021-03-07 ENCOUNTER — Inpatient Hospital Stay (HOSPITAL_COMMUNITY): Payer: Medicare HMO | Attending: Hematology

## 2021-03-07 ENCOUNTER — Ambulatory Visit (HOSPITAL_COMMUNITY)
Admission: RE | Admit: 2021-03-07 | Discharge: 2021-03-07 | Disposition: A | Discharge status: Home / Self Care | Payer: Medicare HMO | Source: Ambulatory Visit | Attending: Hematology | Admitting: Hematology

## 2021-03-07 ENCOUNTER — Other Ambulatory Visit: Payer: Self-pay

## 2021-03-07 DIAGNOSIS — D45 Polycythemia vera: Secondary | ICD-10-CM | POA: Insufficient documentation

## 2021-03-07 DIAGNOSIS — D500 Iron deficiency anemia secondary to blood loss (chronic): Secondary | ICD-10-CM | POA: Insufficient documentation

## 2021-03-07 DIAGNOSIS — C182 Malignant neoplasm of ascending colon: Secondary | ICD-10-CM | POA: Insufficient documentation

## 2021-03-07 LAB — CBC WITH DIFFERENTIAL/PLATELET
Abs Immature Granulocytes: 0.02 10*3/uL (ref 0.00–0.07)
Basophils Absolute: 0.1 10*3/uL (ref 0.0–0.1)
Basophils Relative: 1 %
Eosinophils Absolute: 0.2 10*3/uL (ref 0.0–0.5)
Eosinophils Relative: 3 %
HCT: 48.8 % (ref 39.0–52.0)
Hemoglobin: 17 g/dL (ref 13.0–17.0)
Immature Granulocytes: 0 %
Lymphocytes Relative: 34 %
Lymphs Abs: 2.6 10*3/uL (ref 0.7–4.0)
MCH: 31.4 pg (ref 26.0–34.0)
MCHC: 34.8 g/dL (ref 30.0–36.0)
MCV: 90 fL (ref 80.0–100.0)
Monocytes Absolute: 0.5 10*3/uL (ref 0.1–1.0)
Monocytes Relative: 7 %
Neutro Abs: 4 10*3/uL (ref 1.7–7.7)
Neutrophils Relative %: 55 %
Platelets: 196 10*3/uL (ref 150–400)
RBC: 5.42 MIL/uL (ref 4.22–5.81)
RDW: 12.8 % (ref 11.5–15.5)
WBC: 7.4 10*3/uL (ref 4.0–10.5)
nRBC: 0 % (ref 0.0–0.2)

## 2021-03-07 LAB — COMPREHENSIVE METABOLIC PANEL
ALT: 30 U/L (ref 0–44)
AST: 26 U/L (ref 15–41)
Albumin: 4.8 g/dL (ref 3.5–5.0)
Alkaline Phosphatase: 81 U/L (ref 38–126)
Anion gap: 8 (ref 5–15)
BUN: 9 mg/dL (ref 8–23)
CO2: 26 mmol/L (ref 22–32)
Calcium: 9.3 mg/dL (ref 8.9–10.3)
Chloride: 100 mmol/L (ref 98–111)
Creatinine, Ser: 0.85 mg/dL (ref 0.61–1.24)
GFR, Estimated: >60 mL/min (ref >60)
Glucose, Bld: 103 mg/dL — ABNORMAL HIGH (ref 70–99)
Potassium: 4.5 mmol/L (ref 3.5–5.1)
Sodium: 134 mmol/L — ABNORMAL LOW (ref 135–145)
Total Bilirubin: 1.1 mg/dL (ref 0.3–1.2)
Total Protein: 7.4 g/dL (ref 6.5–8.1)

## 2021-03-07 MED ORDER — IOHEXOL 300 MG/ML  SOLN
100.0000 mL | Freq: Once | INTRAMUSCULAR | Status: AC | PRN
  Administered 2021-03-07: 100 mL via INTRAVENOUS

## 2021-03-08 LAB — CEA: CEA: 1.2 ng/mL (ref 0.0–4.7)

## 2021-03-13 NOTE — Progress Notes (Signed)
 [facility] [address] [place], [place] [address]   CLINIC:  Medical Oncology/Hematology  PCP:  Kim, James, MD [address] / [place] [address] [phone]   REASON FOR VISIT:  Follow-up for stage II colon cancer and IDA  PRIOR THERAPY:  1. Right hemicolectomy on 11/10/2017. 2. Intermittent Feraheme last on 12/08/2017.  NGS Results: Foundation 1 MSI--stable  CURRENT THERAPY: surveillance  BRIEF ONCOLOGIC HISTORY:  Oncology History  Colon cancer (HCC)  10/28/2017 Initial Diagnosis   Colon cancer (HCC)   11/24/2017 Cancer Staging   Staging form: Colon and Rectum, AJCC 8th Edition - Clinical: Stage IIA (cT3, cN0, cM0) - Signed by Katragadda, Sreedhar, MD on 11/24/2017     CANCER STAGING: Cancer Staging Colon cancer (HCC) Staging form: Colon and Rectum, AJCC 8th Edition - Clinical: Stage IIA (cT3, cN0, cM0) - Signed by Katragadda, Sreedhar, MD on 11/24/2017   INTERVAL HISTORY:  [name], a 65 y.o. male, returns for routine follow-up of his stage II colon cancer and IDA. Lessie was last seen on 09/04/2020.   Today he reports feeling good. He reports normal BM and denies hematochezia and black stool. He denies n/v/d/c and abdominal pain, and he reports good appetite.   REVIEW OF SYSTEMS:  Review of Systems  Constitutional:  Negative for appetite change and fatigue.  Gastrointestinal:  Negative for abdominal pain, blood in stool, constipation, diarrhea, nausea and vomiting.  Psychiatric/Behavioral:  Positive for sleep disturbance.   All other systems reviewed and are negative.  PAST MEDICAL/SURGICAL HISTORY:  Past Medical History:  Diagnosis Date   Anemia    Cancer (HCC)    Ascending colon s/p right hemicolectomy on 11/10/2017.    GERD (gastroesophageal reflux disease)    Hyperlipidemia    Lower back pain    Past Surgical History:  Procedure Laterality Date   BACK SURGERY     BIOPSY  10/17/2017   Procedure:  BIOPSY;  Surgeon: Rourk, Robert M, MD;  [facility];  Service: Endoscopy;;   COLON RESECTION N/A 11/10/2017   Procedure: LAPAROSCOPIC PARTIAL COLECTOMY;  Surgeon: Bridges, Lindsay C, MD;  Location: [facility];  Service: General;  Laterality: N/A;   COLONOSCOPY N/A 10/17/2017   Procedure: COLONOSCOPY;  Surgeon: Rourk, Robert M, MD; bulky apple core tumor in the vicinity of a sending colon.  Diverticulosis in sigmoid and descending colon.  Pathology with invasive adenocarcinoma.   COLONOSCOPY N/A 07/28/2019   Procedure: COLONOSCOPY;  Surgeon: Rourk, Robert M, MD;  [facility];  Service: Endoscopy;  Laterality: N/A;  2:15pm   POLYPECTOMY  07/28/2019   Procedure: POLYPECTOMY;  Surgeon: Rourk, Robert M, MD;  [facility];  Service: Endoscopy;;    SOCIAL HISTORY:  Social History   Socioeconomic History   Marital status: Single    Spouse name: Not on file   Number of children: Not on file   Years of education: Not on file   Highest education level: Not on file  Occupational History   Not on file  Tobacco Use   Smoking status: Never   Smokeless tobacco: Former    Types: Chew    Quit date: 11/25/1978  Vaping Use   Vaping Use: Never used  Substance and Sexual Activity   Alcohol use: No   Drug use: No   Sexual activity: Not on file  Other Topics Concern   Not on file  Social History Narrative   Not on file     Social Determinants of Health   Financial Resource Strain: Not on file  Food Insecurity: Not on file  Transportation Needs: Not on file  Physical Activity: Not on file  Stress: Not on file  Social Connections: Not on file  Intimate Partner Violence: Not on file    FAMILY HISTORY:  Family History  Problem Relation Age of Onset   Stroke Mother    Alcohol abuse Father    Down syndrome Sister    Alcohol abuse Brother    Colon cancer Neg Hx    Ulcers Neg Hx     CURRENT MEDICATIONS:  Current Outpatient Medications  Medication Sig Dispense  Refill   diphenhydrAMINE (BENADRYL) 25 MG tablet Take 25 mg by mouth every 6 (six) hours as needed for allergies.     Omega-3 Fatty Acids (FISH OIL PO) Take 1 capsule by mouth 4 (four) times a week.      rosuvastatin (CRESTOR) 20 MG tablet Take 20 mg by mouth daily.     No current facility-administered medications for this visit.    ALLERGIES:  No Known Allergies  PHYSICAL EXAM:  Performance status (ECOG): 1 - Symptomatic but completely ambulatory  Vitals:   03/14/21 1503  BP: (!) 155/80  Pulse: 67  Resp: 18  Temp: 98.2 F (36.8 C)  SpO2: 99%   Wt Readings from Last 3 Encounters:  03/14/21 175 lb 1.6 oz (79.4 kg)  09/04/20 176 lb (79.8 kg)  02/29/20 173 lb 8 oz (78.7 kg)   Physical Exam Vitals reviewed.  Constitutional:      Appearance: Normal appearance.  Cardiovascular:     Rate and Rhythm: Normal rate and regular rhythm.     Pulses: Normal pulses.     Heart sounds: Normal heart sounds.  Pulmonary:     Effort: Pulmonary effort is normal.     Breath sounds: Normal breath sounds.  Abdominal:     Palpations: Abdomen is soft. There is no hepatomegaly, splenomegaly or mass.     Tenderness: There is no abdominal tenderness.  Musculoskeletal:     Right lower leg: No edema.     Left lower leg: No edema.  Neurological:     General: No focal deficit present.     Mental Status: He is alert and oriented to person, place, and time.  Psychiatric:        Mood and Affect: Mood normal.        Behavior: Behavior normal.     LABORATORY DATA:  I have reviewed the labs as listed.  CBC Latest Ref Rng & Units 03/07/2021 08/28/2020 02/23/2020  WBC 4.0 - 10.5 K/uL 7.4 7.5 7.4  Hemoglobin 13.0 - 17.0 g/dL 17.0 16.4 15.9  Hematocrit 39.0 - 52.0 % 48.8 49.5 46.7  Platelets 150 - 400 K/uL 196 190 199   CMP Latest Ref Rng & Units 03/07/2021 08/28/2020 02/23/2020  Glucose 70 - 99 mg/dL 103(H) 106(H) 102(H)  BUN 8 - 23 mg/dL 9 14 11  Creatinine 0.61 - 1.24 mg/dL 0.85 0.89 0.88  Sodium  135 - 145 mmol/L 134(L) 137 133(L)  Potassium 3.5 - 5.1 mmol/L 4.5 4.5 3.8  Chloride 98 - 111 mmol/L 100 104 99  CO2 22 - 32 mmol/L 26 23 24  Calcium 8.9 - 10.3 mg/dL 9.3 9.4 9.3  Total Protein 6.5 - 8.1 g/dL 7.4 7.3 7.2  Total Bilirubin 0.3 - 1.2 mg/dL 1.1 0.8 1.3(H)  Alkaline Phos 38 - 126 U/L 81 70 68  AST 15 - 41 U/L   26 23 22  ALT 0 - 44 U/L 30 23 22    DIAGNOSTIC IMAGING:  I have independently reviewed the scans and discussed with the patient. CT Abdomen Pelvis W Contrast  Result Date: 03/08/2021 CLINICAL DATA:  Follow-up colorectal cancer, status post hemicolectomy, history of right kidney mass EXAM: CT ABDOMEN AND PELVIS WITH CONTRAST TECHNIQUE: Multidetector CT imaging of the abdomen and pelvis was performed using the standard protocol following bolus administration of intravenous contrast. CONTRAST:  100mL OMNIPAQUE IOHEXOL 300 MG/ML SOLN, additional oral enteric contrast COMPARISON:  CT abdomen pelvis, 02/23/2020, MR abdomen, 07/30/2019 FINDINGS: Lower chest: No acute abnormality. Hepatobiliary: Unchanged focal hypodensity of the left lobe of the liver, adjacent to the falciform ligament, hepatic segment IV B, previously characterized by MRI as focal fatty deposition (series 2, image 22). No gallstones, gallbladder wall thickening, or biliary dilatation. Pancreas: Unremarkable. No pancreatic ductal dilatation or surrounding inflammatory changes. Spleen: Normal in size without significant abnormality. Adrenals/Urinary Tract: Adrenal glands are unremarkable. Interval enlargement of a heterogeneously enhancing partially exophytic mass of the posterior midportion of the right kidney, measuring 2.1 x 1.6 cm, previously 1.6 x 1.4 cm (series 2, image 41). Left kidney is normal, without renal calculi, solid lesion, or hydronephrosis. Bladder is unremarkable. Stomach/Bowel: Stomach is within normal limits. Status post right hemicolectomy. No evidence of bowel wall thickening, distention, or  inflammatory changes. Vascular/Lymphatic: Aortic atherosclerosis. No enlarged abdominal or pelvic lymph nodes. Reproductive: No mass or other significant abnormality. Other: No abdominal wall hernia or abnormality. No abdominopelvic ascites. Musculoskeletal: No acute or significant osseous findings. IMPRESSION: 1. Interval enlargement of a heterogeneously enhancing partially exophytic mass of the posterior midportion of the right kidney, measuring 2.1 x 1.6 cm, previously 1.6 x 1.4 cm. Findings are consistent with a slowly enlarging renal cell carcinoma. 2. Status post right hemicolectomy. No evidence of recurrent or metastatic disease in the abdomen or pelvis. 3. Unchanged focal hypodensity of the left lobe of the liver, adjacent to the falciform ligament, hepatic segment IV B, previously characterized by MRI as focal fatty deposition. Aortic Atherosclerosis (ICD10-I70.0). Electronically Signed   By: Alex D Bibbey M.D.   On: 03/08/2021 09:06     ASSESSMENT:  1.  Stage II (T3N0) colon cancer: -Right hemicolectomy on 11/10/2017, MSI stable. -Colonoscopy on 07/28/2019 showed 5 mm polyp in the sigmoid colon consistent with tubular adenoma.  Exam otherwise normal. -MRI of the abdomen on 07/30/2019 did not show any evidence of metastatic disease.  CT scanning on 04/28/2019 also showed stable exam. -CTAP on 02/23/2020 did not show any evidence of metastatic disease.   2.  Right kidney mass: -MRI of the abdomen on 07/30/2019 showed 12 x 13 mm lesion in the posterior right lower kidney.  This is grossly unchanged from CT from 04/28/2019. -CT abdomen with and without contrast on 02/23/2020 showed right kidney solid enhancing mass measuring 1.4 x 1.6 cm.  New small low-density structure in the posterior cortex of the left inferior pole measures 5 mm, too small to characterize.   PLAN:  1.  Stage II colon cancer: - He denies any change in bowel habits.  No bleeding per rectum or melena. - Reviewed labs from  03/07/2021.  LFTs are normal.  CBC was normal.  CEA was 1.2. - Reviewed CT [facility] with contrast from 03/07/2021 which did not show any evidence of recurrence or metastatic disease. - RTC 6 months with repeat labs and CEA.  Repeat imaging in 1 year.   2.  Right kidney   mass: - CTAP with contrast on 03/07/2021 shows slight interval enlargement of heterogeneously enhancing partially exophytic mass in the right kidney measuring 2.1 x 1.6 cm, previously 1.6 x 1.4 cm compared to CT scan a year ago. - He does not have any hematuria.  Continue follow-up with Dr. Alyson Ingles later this month.   Orders placed this encounter:  Orders Placed This Encounter  Procedures   CBC with Differential/Platelet   Comprehensive metabolic panel   CEA     [name], MD [facility] [phone]   I, Thana Ates, am acting as a scribe for Dr. [name].  I, [name] MD, have reviewed the above documentation for accuracy and completeness, and I agree with the above.

## 2021-03-14 ENCOUNTER — Inpatient Hospital Stay (HOSPITAL_COMMUNITY): Payer: Medicare HMO | Attending: Hematology | Admitting: Hematology

## 2021-03-14 ENCOUNTER — Other Ambulatory Visit: Payer: Self-pay

## 2021-03-14 VITALS — BP 155/80 | HR 67 | Temp 98.2°F | Resp 18 | Wt 175.1 lb

## 2021-03-14 DIAGNOSIS — K219 Gastro-esophageal reflux disease without esophagitis: Secondary | ICD-10-CM | POA: Diagnosis not present

## 2021-03-14 DIAGNOSIS — K760 Fatty (change of) liver, not elsewhere classified: Secondary | ICD-10-CM | POA: Diagnosis not present

## 2021-03-14 DIAGNOSIS — N2889 Other specified disorders of kidney and ureter: Secondary | ICD-10-CM | POA: Insufficient documentation

## 2021-03-14 DIAGNOSIS — D45 Polycythemia vera: Secondary | ICD-10-CM | POA: Insufficient documentation

## 2021-03-14 DIAGNOSIS — D509 Iron deficiency anemia, unspecified: Secondary | ICD-10-CM | POA: Insufficient documentation

## 2021-03-14 DIAGNOSIS — Z79899 Other long term (current) drug therapy: Secondary | ICD-10-CM | POA: Insufficient documentation

## 2021-03-14 DIAGNOSIS — I700 Atherosclerosis of aorta: Secondary | ICD-10-CM | POA: Diagnosis not present

## 2021-03-14 DIAGNOSIS — M545 Low back pain, unspecified: Secondary | ICD-10-CM | POA: Insufficient documentation

## 2021-03-14 DIAGNOSIS — E785 Hyperlipidemia, unspecified: Secondary | ICD-10-CM | POA: Insufficient documentation

## 2021-03-14 DIAGNOSIS — Z85048 Personal history of other malignant neoplasm of rectum, rectosigmoid junction, and anus: Secondary | ICD-10-CM | POA: Diagnosis not present

## 2021-03-14 DIAGNOSIS — C182 Malignant neoplasm of ascending colon: Secondary | ICD-10-CM | POA: Diagnosis not present

## 2021-03-14 NOTE — Patient Instructions (Addendum)
[facility] at [facility] Discharge Instructions  You were seen today by Dr. Katragadda. He went over your recent results and scans. Dr. Katragadda will see you back in 6 months for labs and follow up.   Thank you for choosing [facility] at [facility] to provide your oncology and hematology care.  To afford each patient quality time with our provider, please arrive at least 15 minutes before your scheduled appointment time.   If you have a lab appointment with the Cancer Center please come in thru the Main Entrance and check in at the main information desk  You need to re-schedule your appointment should you arrive 10 or more minutes late.  We strive to give you quality time with our providers, and arriving late affects you and other patients whose appointments are after yours.  Also, if you no show three or more times for appointments you may be dismissed from the clinic at the providers discretion.     Again, thank you for choosing [facility].  Our hope is that these requests will decrease the amount of time that you wait before being seen by our physicians.       _____________________________________________________________  Should you have questions after your visit to [facility], please contact our office at [phone] between the hours of 8:00 a.m. and 4:30 p.m.  Voicemails left after 4:00 p.m. will not be returned until the following business day.  For prescription refill requests, have your pharmacy contact our office and allow 72 hours.    Cancer Center Support Programs:   > Cancer Support Group  2nd Tuesday of the month 1pm-2pm, Journey Room   

## 2021-03-16 ENCOUNTER — Ambulatory Visit: Payer: Medicare HMO | Admitting: Urology

## 2021-03-27 ENCOUNTER — Ambulatory Visit (INDEPENDENT_AMBULATORY_CARE_PROVIDER_SITE_OTHER): Payer: Medicare HMO | Admitting: Urology

## 2021-03-27 ENCOUNTER — Other Ambulatory Visit: Payer: Self-pay

## 2021-03-27 ENCOUNTER — Encounter: Payer: Self-pay | Admitting: Urology

## 2021-03-27 DIAGNOSIS — Z85528 Personal history of other malignant neoplasm of kidney: Secondary | ICD-10-CM | POA: Diagnosis not present

## 2021-03-27 NOTE — Progress Notes (Signed)
03/27/2021 2:08 PM   Jack Gibbs 1956-04-27 [id number]  Referring provider: Jani Gravel, MD [address] [place],  [place] [address]  Patient location: home Physician location: office I connected with  ADISA LITT on 03/27/21 by a video enabled telemedicine application and verified that I am speaking with the correct person using two identifiers.   I discussed the limitations of evaluation and management by telemedicine. The patient expressed understanding and agreed to proceed.    Followup right renal mass   HPI: Mr Favia is a 89IP here for followup for a right renal mass. He underwent CT 03/07/2021 which showed the right renal mass has increased in size from 1.6 to 2.1cm. He denies any flank pain. No hematuria. NO worsening LUTS.   PMH: Past Medical History:  Diagnosis Date   Anemia    Cancer ([place])    Ascending colon s/p right hemicolectomy on 11/10/2017.    GERD (gastroesophageal reflux disease)    Hyperlipidemia    Lower back pain     Surgical History: Past Surgical History:  Procedure Laterality Date   BACK SURGERY     BIOPSY  10/17/2017   Procedure: BIOPSY;  Surgeon: Daneil Dolin, MD;  [facility];  Service: Endoscopy;;   COLON RESECTION N/A 11/10/2017   Procedure: LAPAROSCOPIC PARTIAL COLECTOMY;  Surgeon: [name], MD;  Location: [facility];  Service: General;  Laterality: N/A;   COLONOSCOPY N/A 10/17/2017   Procedure: COLONOSCOPY;  Surgeon: Daneil Dolin, MD; bulky apple core tumor in the vicinity of a sending colon.  Diverticulosis in sigmoid and descending colon.  Pathology with invasive adenocarcinoma.   COLONOSCOPY N/A 07/28/2019   Procedure: COLONOSCOPY;  Surgeon: Daneil Dolin, MD;  [facility];  Service: Endoscopy;  Laterality: N/A;  2:15pm   POLYPECTOMY  07/28/2019   Procedure: POLYPECTOMY;  Surgeon: Daneil Dolin, MD;  [facility];  Service: Endoscopy;;    Home Medications:   Allergies as of 03/27/2021   No Known Allergies      Medication List        Accurate as of March 27, 2021  2:08 PM. If you have any questions, ask your nurse or doctor.          diphenhydrAMINE 25 MG tablet Commonly known as: BENADRYL Take 25 mg by mouth every 6 (six) hours as needed for allergies.   FISH OIL PO Take 1 capsule by mouth 4 (four) times a week.   rosuvastatin 20 MG tablet Commonly known as: CRESTOR Take 20 mg by mouth daily.        Allergies: No Known Allergies  Family History: Family History  Problem Relation Age of Onset   Stroke Mother    Alcohol abuse Father    Down syndrome Sister    Alcohol abuse Brother    Colon cancer Neg Hx    Ulcers Neg Hx     Social History:  reports that he has never smoked. He quit smokeless tobacco use about 42 years ago.  His smokeless tobacco use included chew. He reports that he does not drink alcohol and does not use drugs.  ROS: All other review of systems were reviewed and are negative except what is noted above in HPI   Laboratory Data: Lab Results  Component Value Date   WBC 7.4 03/07/2021   HGB 17.0 03/07/2021   HCT 48.8 03/07/2021   MCV 90.0 03/07/2021   PLT 196 03/07/2021  Lab Results  Component Value Date   CREATININE 0.85 03/07/2021    No results found for: PSA  No results found for: TESTOSTERONE  No results found for: HGBA1C  Urinalysis    Component Value Date/Time   APPEARANCEUR Clear 09/12/2020 1632   GLUCOSEU Negative 09/12/2020 1632   BILIRUBINUR Negative 09/12/2020 1632   PROTEINUR Negative 09/12/2020 1632   NITRITE Negative 09/12/2020 1632   LEUKOCYTESUR Negative 09/12/2020 1632    Lab Results  Component Value Date   LABMICR Comment 09/12/2020    Pertinent Imaging: CT abd/pelvis 9/28: Images reviewed and discussed with the patient  No results found for this or any previous visit.  No results found for this or any previous visit.  No results found for this  or any previous visit.  No results found for this or any previous visit.  Results for orders placed during the hospital encounter of 09/12/20  Ultrasound renal complete  Narrative CLINICAL DATA:  Right renal mass  EXAM: RENAL / URINARY TRACT ULTRASOUND COMPLETE  COMPARISON:  CT abdomen/pelvis dated 02/23/2020  FINDINGS: Right Kidney:  Renal measurements: 10.5 x 4.2 x 5.1 cm = volume: 118 mL. Echogenicity within normal limits. 2.0 x 1.8 x 2.0 cm solid lower pole mass. No hydronephrosis.  Left Kidney:  Renal measurements: 11.2 x 5.4 x 5.7 cm = volume: 181 mL. Echogenicity within normal limits. No mass or hydronephrosis visualized.  Bladder:  Appears normal for degree of bladder distention.  Other:  None.  IMPRESSION: 2.0 cm right lower pole renal mass, corresponding to the patient's suspected renal cell carcinoma, previously 1.6 cm.  No hydronephrosis.   Electronically Signed By: Julian Hy M.D. On: 09/13/2020 13:25  No results found for this or any previous visit.  No results found for this or any previous visit.  No results found for this or any previous visit.   Assessment & Plan:    Right renal mass We discussed the natural hx of renal masses and the 80/20 malignant/benign likelihood. We disucssed the treatment options including active surveillance. Renal ablation, partial and radical nephrectomy. After discussing the options the patient elects for active surveillance. RTC 6 months with a renal US   No follow-ups on file.  Nicolette Bang, MD  [facility] [place]

## 2021-03-27 NOTE — Patient Instructions (Signed)
Renal Mass  A renal mass is an abnormal growth in the kidney. It may be found while performing an MRI, CT scan, or ultrasound to evaluate other problems of the abdomen. A renal mass that is cancerous (malignant) may grow or spread quickly. Others are not cancerous (benign). Renal masses include: Tumors. These may be malignant or benign. The most common type of kidney cancer in adults is renal cell carcinoma. In children, the most common type of kidney cancer is Wilms tumor. The most common benign tumors of the kidney include renal adenomas, oncocytomas, and angiomyolipoma (AML). Cysts. These are fluid-filled sacs that form on or in the kidney. What are the causes? Certain types of cancers, infections, or injuries can cause a renal mass. It isnot always known what causes a cyst to develop in or on the kidney. What are the signs or symptoms? Often, a renal mass does not cause any signs or symptoms; most kidney cysts donot cause symptoms. How is this diagnosed? Your health care provider may recommend tests to diagnose the cause of your renal mass. These tests may be done if a renal mass is found: Physical exam. Blood tests. Urine tests. Imaging tests, such as ultrasound, CT scan, or MRI. Biopsy. This is a small sample that is removed from the renal mass and tested in a lab. The exact tests and how often they are done will depend on: The size and appearance of the renal mass. Risk factors or medical conditions that increase your risk for problems. Any symptoms associated with the renal mass, or concerns that you have about it. Tests and physical exams may be done once, or they may be done regularly for a period of time. Tests and exams that are done regularly will help monitorwhether the mass is growing and beginning to cause problems. How is this treated? Treatment is not always needed for this condition. Your health care provider may recommend careful monitoring and regular tests and exams.  Treatment willdepend on the cause of the mass. Treatment for a cancerous renal mass may include surgical removal,chemotherapy, radiation, or immunotherapy. Most kidney cysts do not need to be treated. Follow these instructions at home: What you need to do at home will depend on the cause of the mass. Follow the instructions that your health care provider gives to you. In general: Take over-the-counter and prescription medicines only as told by your health care provider. If you were prescribed an antibiotic medicine, take it as told by your health care provider. Do not stop taking the antibiotic even if you start to feel better. Follow any restrictions that are given to you by your health care provider. Keep all follow-up visits. This is important. You may need to see your health care provider once or twice a year to have CT scans and ultrasounds. These tests will show if your renal mass has changed or grown. Contact a health care provider if you: Have pain in your side or back (flank pain). Have a fever. Feel full soon after eating. Have pain or swelling in the abdomen. Lose weight. Get help right away if: Your pain gets worse. There is blood in your urine. You cannot urinate. You have chest pain. You have trouble breathing. These symptoms may represent a serious problem that is an emergency. Do not wait to see if the symptoms will go away. Get medical help right away. Call your local emergency services (911 in the [place] Summary A renal mass is an abnormal growth in the kidney.   It may be cancerous (malignant) and grow or spread quickly, or it may not be cancerous (benign). Renal masses often do not have any signs or symptoms. Renal masses may be found while performing an MRI, CT scan, or ultrasound for other problems of the abdomen. Your health care provider may recommend that you have tests to diagnose the cause of your renal mass. These may include a physical exam, blood tests, urine  tests, imaging, or a biopsy. Treatment is not always needed for this condition. Careful monitoring may be recommended. This information is not intended to replace advice given to you by your health care provider. Make sure you discuss any questions you have with your healthcare provider. Document Revised: 11/22/2019 Document Reviewed: 11/22/2019 Elsevier Patient Education  2022 Elsevier Inc.  

## 2021-05-12 IMAGING — US US RENAL
1 series · 14 of 25 positions shown · non-contrast
Comparison: CT abdomen/pelvis dated 02/23/2020

CLINICAL DATA: Right renal mass

EXAM:
RENAL / URINARY TRACT ULTRASOUND COMPLETE

[Series 1: us renal · 14 of 56 slices shown]
[im 1/56]
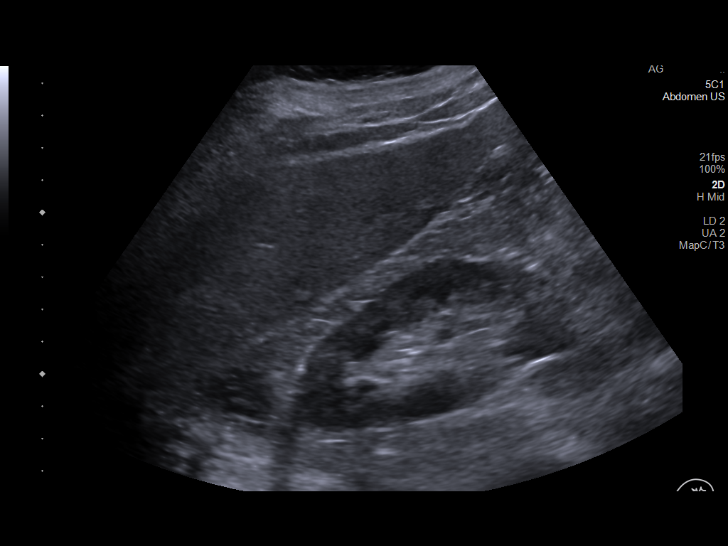
[im 5/56]
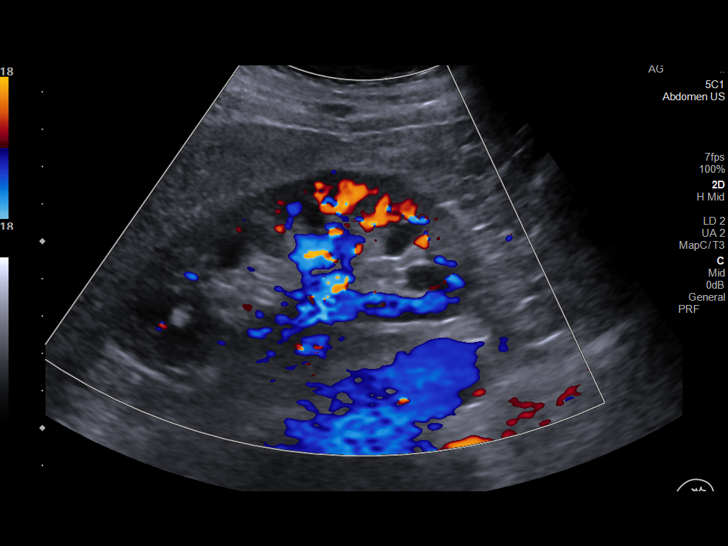
[im 10/56]
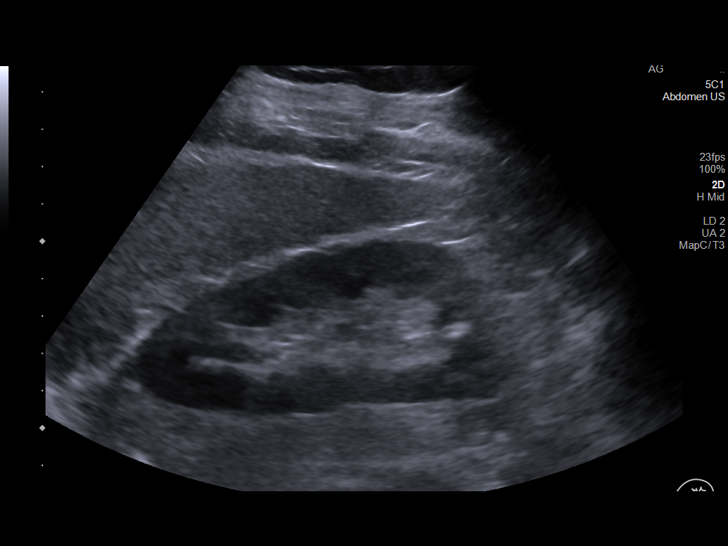
[im 14/56]
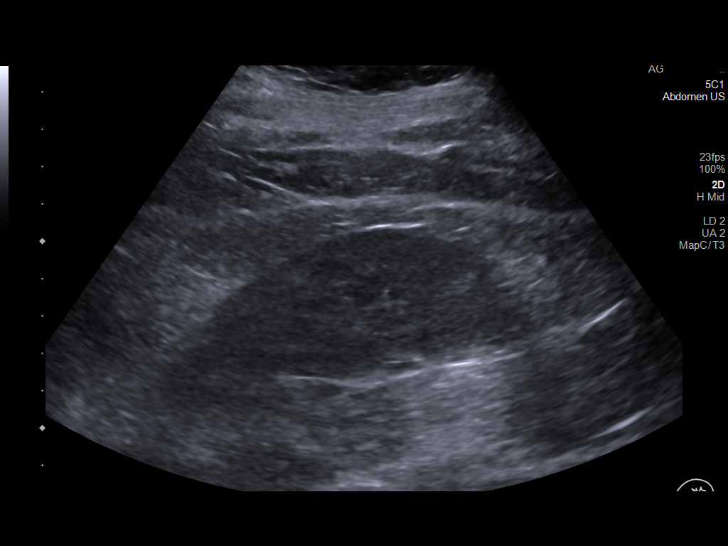
[im 19/56]
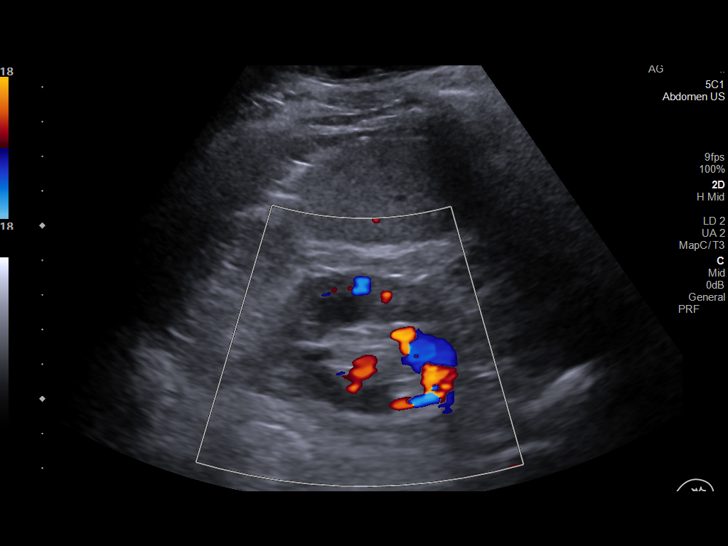
[im 21/56]
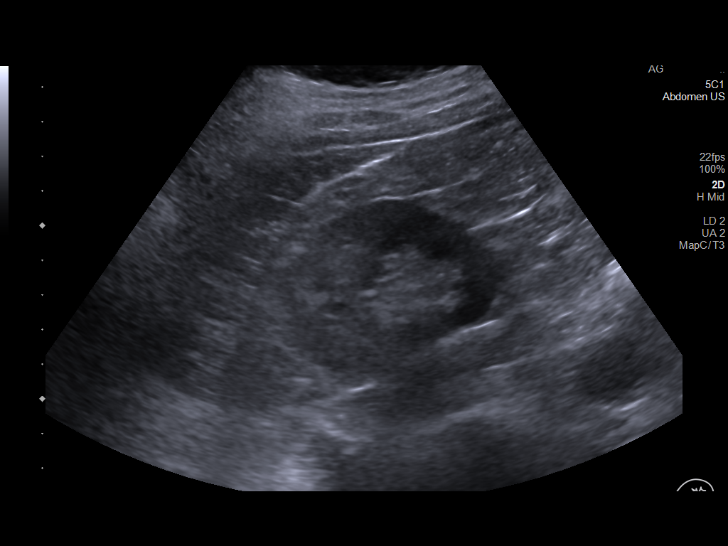
[im 26/56]
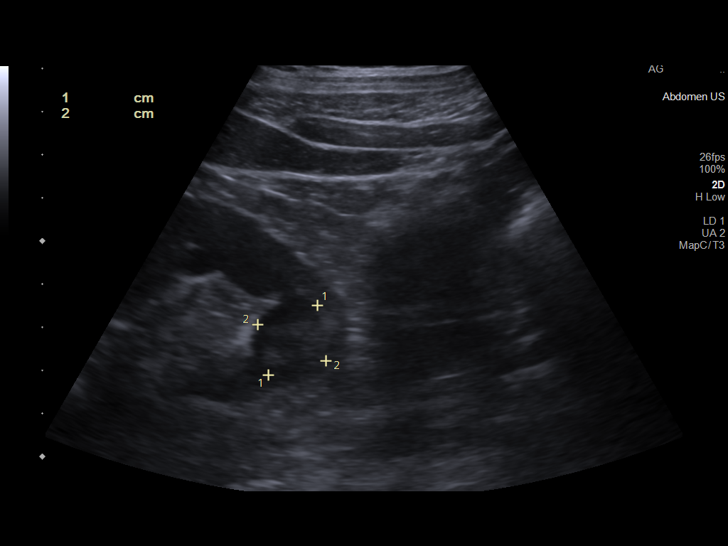
[im 30/56]
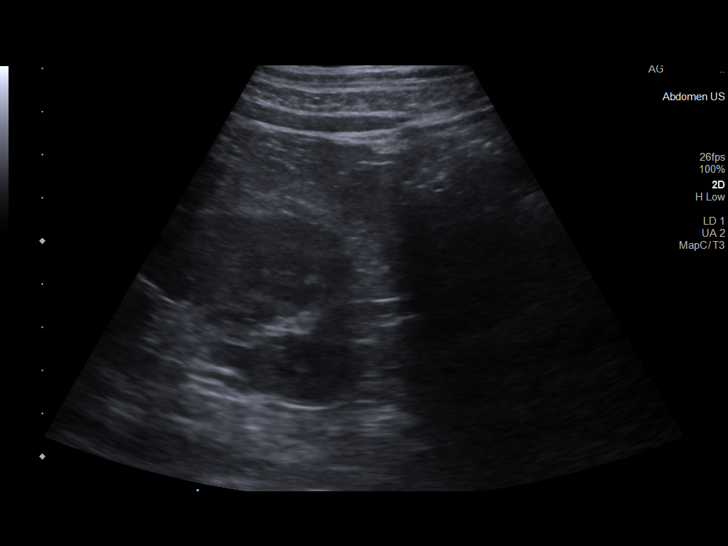
[im 35/56]
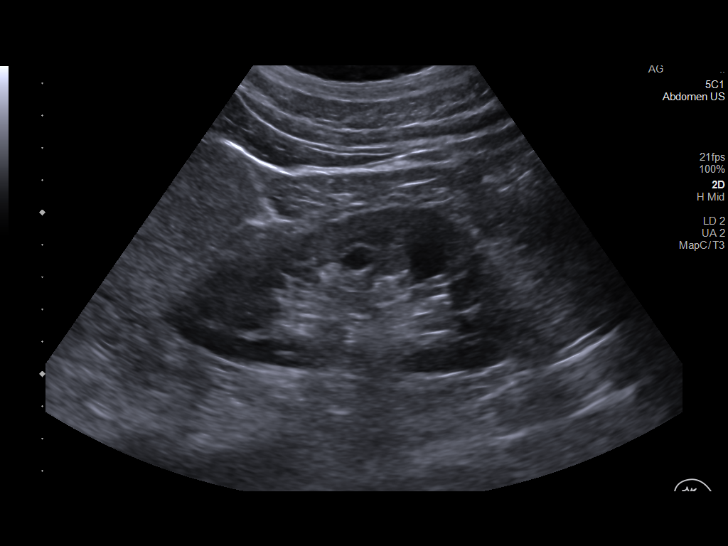
[im 37/56]
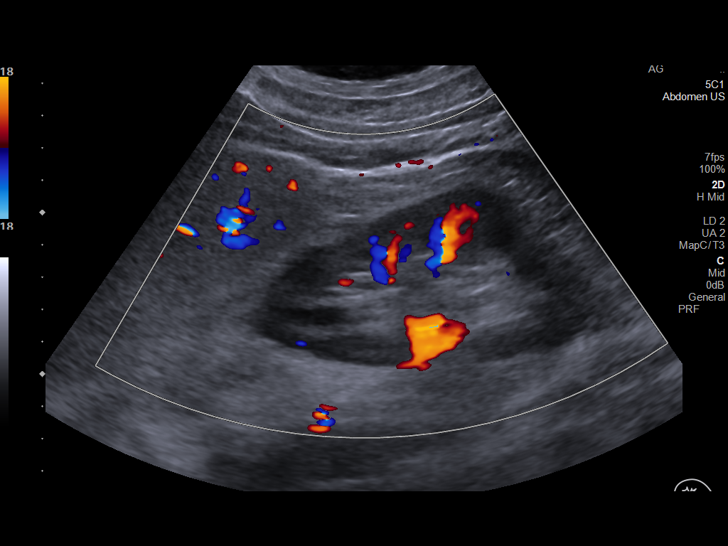
[im 42/56]
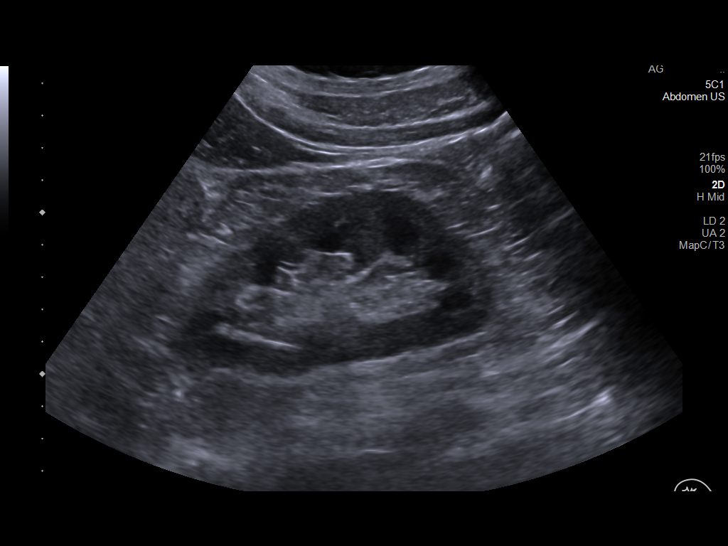
[im 46/56]
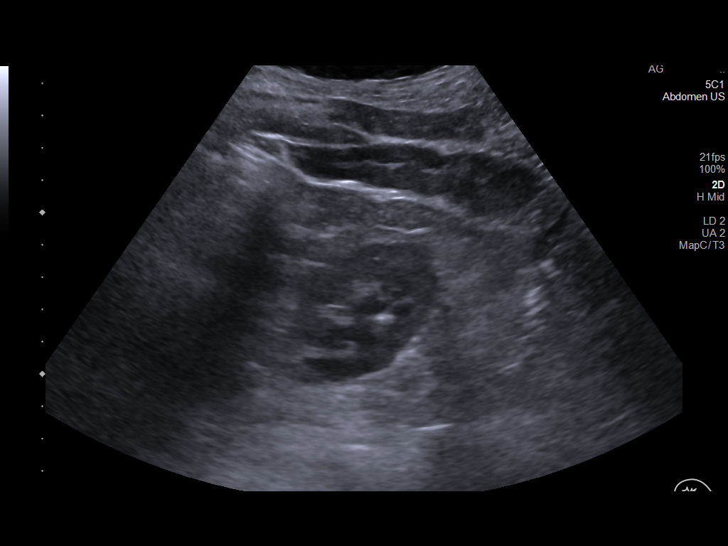
[im 51/56]
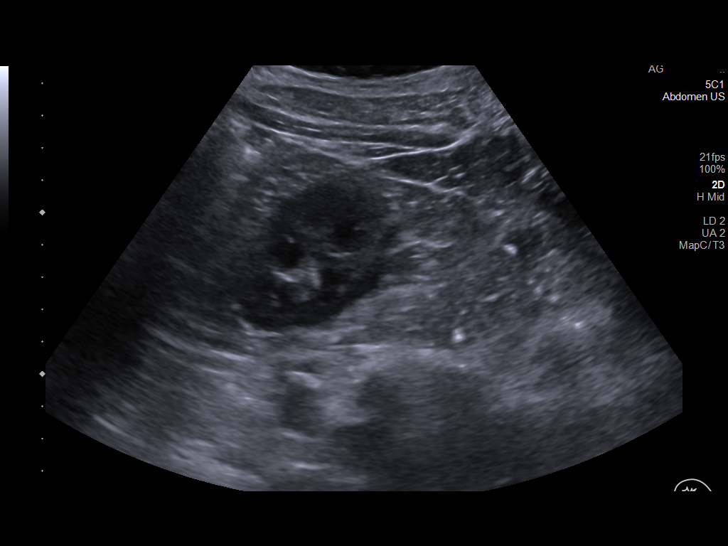
[im 56/56]
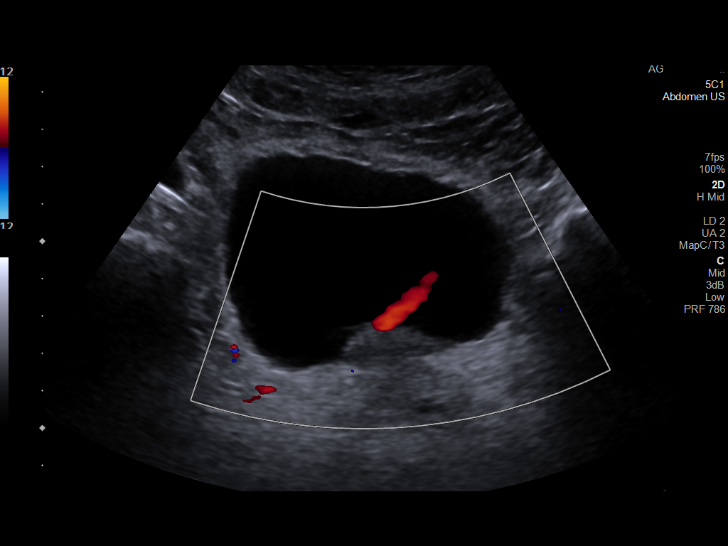

[14 of 25 positions shown; findings below may reference images not displayed]

FINDINGS: Right Kidney:

Renal measurements: 10.5 x 4.2 x 5.1 cm = volume: 118 mL.
Echogenicity within normal limits. 2.0 x 1.8 x 2.0 cm solid lower
pole mass. No hydronephrosis.

Left Kidney:

Renal measurements: 11.2 x 5.4 x 5.7 cm = volume: 181 mL.
Echogenicity within normal limits. No mass or hydronephrosis
visualized.

Bladder:

Appears normal for degree of bladder distention.

Other:

None.
IMPRESSION: 2.0 cm right lower pole renal mass, corresponding to the patient's
suspected renal cell carcinoma, previously 1.6 cm.

No hydronephrosis.

## 2021-09-12 ENCOUNTER — Inpatient Hospital Stay (HOSPITAL_COMMUNITY): Payer: Medicare HMO | Attending: Hematology

## 2021-09-12 DIAGNOSIS — Z79899 Other long term (current) drug therapy: Secondary | ICD-10-CM | POA: Insufficient documentation

## 2021-09-12 DIAGNOSIS — D509 Iron deficiency anemia, unspecified: Secondary | ICD-10-CM | POA: Insufficient documentation

## 2021-09-12 DIAGNOSIS — C189 Malignant neoplasm of colon, unspecified: Secondary | ICD-10-CM | POA: Insufficient documentation

## 2021-09-12 DIAGNOSIS — C182 Malignant neoplasm of ascending colon: Secondary | ICD-10-CM

## 2021-09-12 DIAGNOSIS — Z87891 Personal history of nicotine dependence: Secondary | ICD-10-CM | POA: Diagnosis not present

## 2021-09-12 DIAGNOSIS — K219 Gastro-esophageal reflux disease without esophagitis: Secondary | ICD-10-CM | POA: Diagnosis not present

## 2021-09-12 DIAGNOSIS — E785 Hyperlipidemia, unspecified: Secondary | ICD-10-CM | POA: Diagnosis not present

## 2021-09-12 DIAGNOSIS — N2889 Other specified disorders of kidney and ureter: Secondary | ICD-10-CM | POA: Insufficient documentation

## 2021-09-12 LAB — CBC WITH DIFFERENTIAL/PLATELET
Abs Immature Granulocytes: 0.02 10*3/uL (ref 0.00–0.07)
Basophils Absolute: 0.1 10*3/uL (ref 0.0–0.1)
Basophils Relative: 1 %
Eosinophils Absolute: 0.3 10*3/uL (ref 0.0–0.5)
Eosinophils Relative: 3 %
HCT: 46 % (ref 39.0–52.0)
Hemoglobin: 16.1 g/dL (ref 13.0–17.0)
Immature Granulocytes: 0 %
Lymphocytes Relative: 41 %
Lymphs Abs: 3.1 10*3/uL (ref 0.7–4.0)
MCH: 31.3 pg (ref 26.0–34.0)
MCHC: 35 g/dL (ref 30.0–36.0)
MCV: 89.3 fL (ref 80.0–100.0)
Monocytes Absolute: 0.6 10*3/uL (ref 0.1–1.0)
Monocytes Relative: 8 %
Neutro Abs: 3.5 10*3/uL (ref 1.7–7.7)
Neutrophils Relative %: 47 %
Platelets: 191 10*3/uL (ref 150–400)
RBC: 5.15 MIL/uL (ref 4.22–5.81)
RDW: 12.6 % (ref 11.5–15.5)
WBC: 7.5 10*3/uL (ref 4.0–10.5)
nRBC: 0 % (ref 0.0–0.2)

## 2021-09-12 LAB — COMPREHENSIVE METABOLIC PANEL
ALT: 23 U/L (ref 0–44)
AST: 22 U/L (ref 15–41)
Albumin: 4.7 g/dL (ref 3.5–5.0)
Alkaline Phosphatase: 79 U/L (ref 38–126)
Anion gap: 8 (ref 5–15)
BUN: 12 mg/dL (ref 8–23)
CO2: 25 mmol/L (ref 22–32)
Calcium: 9.4 mg/dL (ref 8.9–10.3)
Chloride: 102 mmol/L (ref 98–111)
Creatinine, Ser: 0.86 mg/dL (ref 0.61–1.24)
GFR, Estimated: >60 mL/min (ref >60)
Glucose, Bld: 97 mg/dL (ref 70–99)
Potassium: 4.8 mmol/L (ref 3.5–5.1)
Sodium: 135 mmol/L (ref 135–145)
Total Bilirubin: 1.3 mg/dL — ABNORMAL HIGH (ref 0.3–1.2)
Total Protein: 7.4 g/dL (ref 6.5–8.1)

## 2021-09-14 LAB — CEA: CEA: 1.1 ng/mL (ref 0.0–4.7)

## 2021-09-19 ENCOUNTER — Ambulatory Visit (HOSPITAL_COMMUNITY)
Admission: RE | Admit: 2021-09-19 | Discharge: 2021-09-19 | Disposition: A | Discharge status: Home / Self Care | Payer: Medicare HMO | Source: Ambulatory Visit | Attending: Urology | Admitting: Urology

## 2021-09-19 ENCOUNTER — Inpatient Hospital Stay (HOSPITAL_BASED_OUTPATIENT_CLINIC_OR_DEPARTMENT_OTHER): Payer: Medicare HMO | Admitting: Hematology

## 2021-09-19 VITALS — BP 147/79 | HR 68 | Temp 98.1°F | Resp 18 | Ht 65.0 in | Wt 180.2 lb

## 2021-09-19 DIAGNOSIS — Z85528 Personal history of other malignant neoplasm of kidney: Secondary | ICD-10-CM | POA: Insufficient documentation

## 2021-09-19 DIAGNOSIS — D500 Iron deficiency anemia secondary to blood loss (chronic): Secondary | ICD-10-CM | POA: Diagnosis not present

## 2021-09-19 DIAGNOSIS — C182 Malignant neoplasm of ascending colon: Secondary | ICD-10-CM

## 2021-09-19 DIAGNOSIS — C189 Malignant neoplasm of colon, unspecified: Secondary | ICD-10-CM | POA: Diagnosis not present

## 2021-09-19 NOTE — Patient Instructions (Signed)
[facility] at [facility] ?Discharge Instructions ? ?You were seen and examined today by Dr. Katragadda. He reviewed your most recent labs and everything looks good. Please keep follow up appointments as scheduled in 6 months.  ? ? ?Thank you for choosing [facility] at [facility] to provide your oncology and hematology care.  To afford each patient quality time with our provider, please arrive at least 15 minutes before your scheduled appointment time.  ? ?If you have a lab appointment with the Cancer Center please come in thru the Main Entrance and check in at the main information desk. ? ?You need to re-schedule your appointment should you arrive 10 or more minutes late.  We strive to give you quality time with our providers, and arriving late affects you and other patients whose appointments are after yours.  Also, if you no show three or more times for appointments you may be dismissed from the clinic at the providers discretion.     ?Again, thank you for choosing [facility].  Our hope is that these requests will decrease the amount of time that you wait before being seen by our physicians.       ?_____________________________________________________________ ? ?Should you have questions after your visit to  Cancer Center, please contact our office at [phone] and follow the prompts.  Our office hours are 8:00 a.m. and 4:30 p.m. Monday - Friday.  Please note that voicemails left after 4:00 p.m. may not be returned until the following business day.  We are closed weekends and major holidays.  You do have access to a nurse 24-7, just call the main number to the clinic [phone] and do not press any options, hold on the line and a nurse will answer the phone.   ? ?For prescription refill requests, have your pharmacy contact our office and allow 72 hours.   ? ?Due to Covid, you will need to wear a mask upon entering the hospital. If you  do not have a mask, a mask will be given to you at the Main Entrance upon arrival. For doctor visits, patients may have 1 support person age 18 or older with them. For treatment visits, patients can not have anyone with them due to social distancing guidelines and our immunocompromised population.  ? ?  ?

## 2021-09-19 NOTE — Progress Notes (Signed)
? ?[facility] ?[address] ?[place], [place] [address] ? ? ?CLINIC:  ?Medical Oncology/Hematology ? ?PCP:  ?Jani Gravel, MD ?[address] [place] [address] ?[phone] ? ? ?REASON FOR VISIT:  ?Follow-up for stage II colon cancer and IDA ? ?PRIOR THERAPY:  ?1. Right hemicolectomy on 11/10/2017. ?2. Intermittent Feraheme last on 12/08/2017. ? ?NGS Results: Foundation 1 MSI--stable ? ?CURRENT THERAPY: surveillance ? ?BRIEF ONCOLOGIC HISTORY:  ?Oncology History  ?Colon cancer ([facility])  ?10/28/2017 Initial Diagnosis  ? Colon cancer [facility]) ?  ?11/24/2017 Cancer Staging  ? Staging form: Colon and Rectum, AJCC 8th Edition ?- Clinical: Stage IIA (cT3, cN0, cM0) - Signed by [name], MD on 11/24/2017 ? ?  ? ? ?CANCER STAGING: ? Cancer Staging  ?Colon cancer ([facility]) ?Staging form: Colon and Rectum, AJCC 8th Edition ?- Clinical: Stage IIA (cT3, cN0, cM0) - Signed by [name], MD on 11/24/2017 ? ? ?INTERVAL HISTORY:  ?[name], a 66 y.o. male, returns for routine follow-up of his stage II colon cancer and IDA. Jack Gibbs was last seen on 03/14/2021.  ? ?Today he reports feeling good. He denies hematochezia ans denies changes to BM. He denies new pains and abdominal pain.  ? ?REVIEW OF SYSTEMS:  ?Review of Systems  ?Constitutional:  Negative for appetite change and fatigue.  ?Gastrointestinal:  Negative for abdominal pain, blood in stool, constipation and diarrhea.  ?Psychiatric/Behavioral:  Positive for sleep disturbance.   ?All other systems reviewed and are negative. ? ?PAST MEDICAL/SURGICAL HISTORY:  ?Past Medical History:  ?Diagnosis Date  ? Anemia   ? Cancer [facility])   ? Ascending colon s/p right hemicolectomy on 11/10/2017.   ? GERD (gastroesophageal reflux disease)   ? Hyperlipidemia   ? Lower back pain   ? ?Past Surgical History:  ?Procedure Laterality Date  ? BACK SURGERY    ? BIOPSY  10/17/2017  ? Procedure: BIOPSY;  Surgeon: Daneil Dolin, MD;  [facility];  Service: Endoscopy;;  ? COLON RESECTION N/A 11/10/2017  ? Procedure: LAPAROSCOPIC PARTIAL COLECTOMY;  Surgeon: [name], MD;  Location: [facility] ORS;  Service: General;  Laterality: N/A;  ? COLONOSCOPY N/A 10/17/2017  ? Procedure: COLONOSCOPY;  Surgeon: Daneil Dolin, MD; bulky apple core tumor in the vicinity of a sending colon.  Diverticulosis in sigmoid and descending colon.  Pathology with invasive adenocarcinoma.  ? COLONOSCOPY N/A 07/28/2019  ? Procedure: COLONOSCOPY;  Surgeon: Daneil Dolin, MD;  Location: [facility] END[facility];  Service: Endoscopy;  Laterality: N/A;  2:15pm  ? POLYPECTOMY  07/28/2019  ? Procedure: POLYPECTOMY;  Surgeon: Daneil Dolin, MD;  Location[facility];  Service: Endoscopy;;  ? ? ?SOCIAL HISTORY:  ?Social History  ? ?Socioeconomic History  ? Marital status: Single  ?  Spouse name: Not on file  ? Number of children: Not on file  ? Years of education: Not on file  ? Highest education level: Not on file  ?Occupational History  ? Not on file  ?Tobacco Use  ? Smoking status: Never  ? Smokeless tobacco: Former  ?  Types: Chew  ?  Quit date: 11/25/1978  ?Vaping Use  ? Vaping Use: Never used  ?Substance and Sexual Activity  ? Alcohol use: No  ? Drug use: No  ? Sexual activity: Not on file  ?Other Topics Concern  ? Not on file  ?Social History Narrative  ? Not on file  ? ?Social Determinants of Health  ? ?  Financial Resource Strain: Not on file  ?Food Insecurity: Not on file  ?Transportation Needs: Not on file  ?Physical Activity: Not on file  ?Stress: Not on file  ?Social Connections: Not on file  ?Intimate Partner Violence: Not on file  ? ? ?FAMILY HISTORY:  ?Family History  ?Problem Relation Age of Onset  ? Stroke Mother   ? Alcohol abuse Father   ? Down syndrome Sister   ? Alcohol abuse Brother   ? Colon cancer Neg Hx   ? Ulcers Neg Hx   ? ? ?CURRENT MEDICATIONS:  ?Current Outpatient Medications  ?Medication Sig Dispense Refill  ? diphenhydrAMINE (BENADRYL) 25 MG tablet Take 25 mg  by mouth every 6 (six) hours as needed for allergies.    ? lisinopril (ZESTRIL) 10 MG tablet Take 10 mg by mouth daily.    ? Omega-3 Fatty Acids (FISH OIL PO) Take 1 capsule by mouth 4 (four) times a week.     ? rosuvastatin (CRESTOR) 20 MG tablet Take 20 mg by mouth daily.    ? ?No current facility-administered medications for this visit.  ? ? ?ALLERGIES:  ?No Known Allergies ? ?PHYSICAL EXAM:  ?Performance status (ECOG): 1 - Symptomatic but completely ambulatory ? ?Vitals:  ? 09/19/21 1503  ?BP: (!) 147/79  ?Pulse: 68  ?Resp: 18  ?Temp: 98.1 ?F (36.7 ?C)  ?SpO2: 98%  ? ?Wt Readings from Last 3 Encounters:  ?09/19/21 180 lb 3 oz (81.7 kg)  ?03/14/21 175 lb 1.6 oz (79.4 kg)  ?09/04/20 176 lb (79.8 kg)  ? ?Physical Exam ?Vitals reviewed.  ?Constitutional:   ?   Appearance: Normal appearance.  ?Cardiovascular:  ?   Rate and Rhythm: Normal rate and regular rhythm.  ?   Pulses: Normal pulses.  ?   Heart sounds: Normal heart sounds.  ?Pulmonary:  ?   Effort: Pulmonary effort is normal.  ?   Breath sounds: Normal breath sounds.  ?Abdominal:  ?   Palpations: Abdomen is soft. There is no hepatomegaly, splenomegaly or mass.  ?   Tenderness: There is no abdominal tenderness.  ?Neurological:  ?   General: No focal deficit present.  ?   Mental Status: He is alert and oriented to person, place, and time.  ?Psychiatric:     ?   Mood and Affect: Mood normal.     ?   Behavior: Behavior normal.  ?  ? ?LABORATORY DATA:  ?I have reviewed the labs as listed.  ? ?  Latest Ref Rng & Units 09/12/2021  ?  1:30 PM 03/07/2021  ?  8:56 AM 08/28/2020  ?  1:09 PM  ?CBC  ?WBC 4.0 - 10.5 K/uL 7.5   7.4   7.5    ?Hemoglobin 13.0 - 17.0 g/dL 16.1   17.0   16.4    ?Hematocrit 39.0 - 52.0 % 46.0   48.8   49.5    ?Platelets 150 - 400 K/uL 191   196   190    ? ? ?  Latest Ref Rng & Units 09/12/2021  ?  1:30 PM 03/07/2021  ?  8:56 AM 08/28/2020  ?  1:09 PM  ?CMP  ?Glucose 70 - 99 mg/dL 97   103   106    ?BUN 8 - 23 mg/dL _0 ?Creatinine 0.61 - 1.24  mg/dL 0.86   0.85   0.89    ?Sodium 135 - 145 mmol/L 135   134  137    ?Potassium 3.5 - 5.1 mmol/L 4.8   4.5   4.5    ?Chloride 98 - 111 mmol/L 102   100   104    ?CO2 22 - 32 mmol/L _0 ?Calcium 8.9 - 10.3 mg/dL 9.4   9.3   9.4    ?Total Protein 6.5 - 8.1 g/dL 7.4   7.4   7.3    ?Total Bilirubin 0.3 - 1.2 mg/dL 1.3   1.1   0.8    ?Alkaline Phos 38 - 126 U/L 79   81   70    ?AST 15 - 41 U/L _1 ?ALT 0 - 44 U/L _2 ? ? ?DIAGNOSTIC IMAGING:  ?I have independently reviewed the scans and discussed with the patient. ?No results found.  ? ?ASSESSMENT:  ?1.  Stage II (T3N0) colon cancer: ?-Right hemicolectomy on 11/10/2017, MSI stable. ?-Colonoscopy on 07/28/2019 showed 5 mm polyp in the sigmoid colon consistent with tubular adenoma.  Exam otherwise normal. ?-MRI of the abdomen on 07/30/2019 did not show any evidence of metastatic disease.  CT scanning on 04/28/2019 also showed stable exam. ?-CTAP on 02/23/2020 did not show any evidence of metastatic disease. ?  ?2.  Right kidney mass: ?-MRI of the abdomen on 07/30/2019 showed 12 x 13 mm lesion in the posterior right lower kidney.  This is grossly unchanged from CT from 04/28/2019. ?-CT abdomen with and without contrast on 02/23/2020 showed right kidney solid enhancing mass measuring 1.4 x 1.6 cm.  New small low-density structure in the posterior cortex of the left inferior pole measures 5 mm, too small to characterize. ? ? ?PLAN:  ?1.  Stage II colon cancer: ?- Denies any bowel habits or bleeding per rectum.  No abdominal pains. ?- Last CTAP was in September 2022. ?- Reviewed labs today which showed normal LFTs and CBC.  CEA was 1.1. ?- RTC 6 months with repeat labs and CT scan. ?  ?2.  Right kidney mass: ?- Previous CT scan showed right kidney mass. ?- He has undergone ultrasound today and will follow-up with Dr. Alyson Ingles. ?  ?Orders placed this encounter:  ?No orders of the defined types were placed in this encounter. ? ? ? ?[name], MD ?[facility] ?[phone] ? ? ?I, Thana Ates, am acting as a scribe for Dr. [name]. ? ?I, [name] MD, have reviewed the above documentation for accuracy

## 2021-09-26 ENCOUNTER — Ambulatory Visit: Payer: Medicare HMO | Admitting: Urology

## 2021-09-26 VITALS — BP 174/88 | HR 57

## 2021-09-26 DIAGNOSIS — N2889 Other specified disorders of kidney and ureter: Secondary | ICD-10-CM

## 2021-09-26 DIAGNOSIS — Z85528 Personal history of other malignant neoplasm of kidney: Secondary | ICD-10-CM | POA: Diagnosis not present

## 2021-09-26 LAB — URINALYSIS, ROUTINE W REFLEX MICROSCOPIC
Bilirubin, UA: NEGATIVE
Glucose, UA: NEGATIVE
Ketones, UA: NEGATIVE
Leukocytes,UA: NEGATIVE
Nitrite, UA: NEGATIVE
Protein,UA: NEGATIVE
RBC, UA: NEGATIVE
Specific Gravity, UA: 1.01 (ref 1.005–1.030)
Urobilinogen, Ur: 0.2 mg/dL (ref 0.2–1.0)
pH, UA: 6.5 (ref 5.0–7.5)

## 2021-09-26 NOTE — Progress Notes (Signed)
? ?09/26/2021 ?10:00 AM  ? ?Jack Gibbs ?25-Oct-1955 ?[id number] ? ?Referring provider: Jani Gravel, MD ?[address] ?[address] ?[place],  [place] [address] ? ?Followup renal mass ? ?HPI: ?Mr Armbrust is a 66yo here for followup for a reight renal mass. Renal US from 4/13 shows a 2.4cm right renal mass that is stable in size. He denies any flank pain. He denies any LUTS. No new complaints today ? ? ?PMH: ?Past Medical History:  ?Diagnosis Date  ? Anemia   ? Cancer [facility])   ? Ascending colon s/p right hemicolectomy on 11/10/2017.   ? GERD (gastroesophageal reflux disease)   ? Hyperlipidemia   ? Lower back pain   ? ? ?Surgical History: ?Past Surgical History:  ?Procedure Laterality Date  ? BACK SURGERY    ? BIOPSY  10/17/2017  ? Procedure: BIOPSY;  Surgeon: Daneil Dolin, MD;  [facility];  Service: Endoscopy;;  ? COLON RESECTION N/A 11/10/2017  ? Procedure: LAPAROSCOPIC PARTIAL COLECTOMY;  Surgeon: [name], MD;  Location: [facility] ORS;  Service: General;  Laterality: N/A;  ? COLONOSCOPY N/A 10/17/2017  ? Procedure: COLONOSCOPY;  Surgeon: Daneil Dolin, MD; bulky apple core tumor in the vicinity of a sending colon.  Diverticulosis in sigmoid and descending colon.  Pathology with invasive adenocarcinoma.  ? COLONOSCOPY N/A 07/28/2019  ? Procedure: COLONOSCOPY;  Surgeon: Daneil Dolin, MD;  Location[facility];  Service: Endoscopy;  Laterality: N/A;  2:15pm  ? POLYPECTOMY  07/28/2019  ? Procedure: POLYPECTOMY;  Surgeon: Daneil Dolin, MD;  Location: [facility];  Service: Endoscopy;;  ? ? ?Home Medications:  ?Allergies as of 09/26/2021   ?No Known Allergies ?  ? ?  ?Medication List  ?  ? ?  ? Accurate as of September 26, 2021 10:00 AM. If you have any questions, ask your nurse or doctor.  ?  ?  ? ?  ? ?diphenhydrAMINE 25 MG tablet ?Commonly known as: BENADRYL ?Take 25 mg by mouth every 6 (six) hours as needed for allergies. ?  ?FISH OIL PO ?Take 1 capsule by mouth 4 (four) times a week. ?   ?lisinopril 10 MG tablet ?Commonly known as: ZESTRIL ?Take 10 mg by mouth daily. ?  ?rosuvastatin 20 MG tablet ?Commonly known as: CRESTOR ?Take 20 mg by mouth daily. ?  ? ?  ? ? ?Allergies: No Known Allergies ? ?Family History: ?Family History  ?Problem Relation Age of Onset  ? Stroke Mother   ? Alcohol abuse Father   ? Down syndrome Sister   ? Alcohol abuse Brother   ? Colon cancer Neg Hx   ? Ulcers Neg Hx   ? ? ?Social History:  reports that he has never smoked. He quit smokeless tobacco use about 42 years ago.  His smokeless tobacco use included chew. He reports that he does not drink alcohol and does not use drugs. ? ?ROS: ?All other review of systems were reviewed and are negative except what is noted above in HPI ? ?Physical Exam: ?BP (!) 174/88   Pulse (!) 57   ?Constitutional:  Alert and oriented, No acute distress. ?HEENT: [place] AT, moist mucus membranes.  Trachea midline, no masses. ?Cardiovascular: No clubbing, cyanosis, or edema. ?Respiratory: Normal respiratory effort, no increased work of breathing. ?GI: Abdomen is soft, nontender, nondistended, no abdominal masses ?GU: No CVA tenderness.  ?Lymph: No cervical or inguinal lymphadenopathy. ?Skin: No rashes, bruises or suspicious lesions. ?Neurologic: Grossly intact, no focal deficits,  moving all 4 extremities. ?Psychiatric: Normal mood and affect. ? ?Laboratory Data: ?Lab Results  ?Component Value Date  ? WBC 7.5 09/12/2021  ? HGB 16.1 09/12/2021  ? HCT 46.0 09/12/2021  ? MCV 89.3 09/12/2021  ? PLT 191 09/12/2021  ? ? ?Lab Results  ?Component Value Date  ? CREATININE 0.86 09/12/2021  ? ? ?No results found for: PSA ? ?No results found for: TESTOSTERONE ? ?No results found for: HGBA1C ? ?Urinalysis ?   ?Component Value Date/Time  ? APPEARANCEUR Clear 09/12/2020 1632  ? GLUCOSEU Negative 09/12/2020 1632  ? BILIRUBINUR Negative 09/12/2020 1632  ? PROTEINUR Negative 09/12/2020 1632  ? NITRITE Negative 09/12/2020 1632  ? LEUKOCYTESUR Negative 09/12/2020 1632   ? ? ?Lab Results  ?Component Value Date  ? LABMICR Comment 09/12/2020  ? ? ?Pertinent Imaging: ?Renal US 09/20/2021: Images reviewed and discussed with the patient  ?No results found for this or any previous visit. ? ?No results found for this or any previous visit. ? ?No results found for this or any previous visit. ? ?No results found for this or any previous visit. ? ?Results for orders placed during the hospital encounter of 09/19/21 ? ?Ultrasound renal complete ? ?Narrative ?CLINICAL DATA:  History of nephrolithiasis. ? ?EXAM: ?RENAL / URINARY TRACT ULTRASOUND COMPLETE ? ?COMPARISON:  CT 03/07/2021.  Ultrasound 09/12/2020. ? ?FINDINGS: ?Right Kidney: ? ?Renal measurements: 11.0 x 4.9 x 5.6 cm = volume: 156.3 mL. ?Echogenicity within normal limits. 2.4 cm solid mass again noted in ?the right kidney. Again this is suspicious for renal cell carcinoma. ?Interval enlargement cannot be excluded. No hydronephrosis ?visualized. ? ?Left Kidney: ? ?Renal measurements: 11.4 x 5.4 x 5.6 cm = volume: 181.6 mL. ?Echogenicity within normal limits. No mass or hydronephrosis ?visualized. ? ?Bladder: ? ?Appears normal for degree of bladder distention. ? ?Other: ? ?None. ? ?IMPRESSION: ?A 2.4 cm solid masses again noted in the right kidney. Again this is ?suspicious for renal cell carcinoma. Interval enlargement cannot be ?excluded. ? ?2.  No acute abnormality.  No hydronephrosis or bladder distention. ? ? ?Electronically Signed ?By: Marcello Moores  Register M.D. ?On: 09/20/2021 11:03 ? ?No results found for this or any previous visit. ? ?No results found for this or any previous visit. ? ?No results found for this or any previous visit. ? ? ?Assessment & Plan:   ? ?1. History of kidney cancer ?-RTC 6 months with CT abd ?- Urinalysis, Routine w reflex microscopic ? ? ?No follow-ups on file. ? ?Nicolette Bang, MD ? ?[facility] ?  ?

## 2021-10-04 ENCOUNTER — Encounter: Payer: Self-pay | Admitting: Urology

## 2021-10-04 NOTE — Patient Instructions (Signed)

## 2022-03-05 ENCOUNTER — Encounter (HOSPITAL_COMMUNITY): Payer: Self-pay | Admitting: Hematology

## 2022-03-12 ENCOUNTER — Other Ambulatory Visit (HOSPITAL_COMMUNITY): Payer: Medicare HMO

## 2022-03-13 ENCOUNTER — Inpatient Hospital Stay: Payer: Medicare HMO | Attending: Hematology

## 2022-03-13 ENCOUNTER — Ambulatory Visit (HOSPITAL_COMMUNITY)
Admission: RE | Admit: 2022-03-13 | Discharge: 2022-03-13 | Disposition: A | Discharge status: Home / Self Care | Payer: Medicare HMO | Source: Ambulatory Visit | Attending: Hematology | Admitting: Hematology

## 2022-03-13 DIAGNOSIS — R059 Cough, unspecified: Secondary | ICD-10-CM | POA: Diagnosis not present

## 2022-03-13 DIAGNOSIS — D500 Iron deficiency anemia secondary to blood loss (chronic): Secondary | ICD-10-CM | POA: Insufficient documentation

## 2022-03-13 DIAGNOSIS — Z79899 Other long term (current) drug therapy: Secondary | ICD-10-CM | POA: Diagnosis not present

## 2022-03-13 DIAGNOSIS — C182 Malignant neoplasm of ascending colon: Secondary | ICD-10-CM | POA: Insufficient documentation

## 2022-03-13 DIAGNOSIS — Z85038 Personal history of other malignant neoplasm of large intestine: Secondary | ICD-10-CM | POA: Insufficient documentation

## 2022-03-13 DIAGNOSIS — K219 Gastro-esophageal reflux disease without esophagitis: Secondary | ICD-10-CM | POA: Insufficient documentation

## 2022-03-13 DIAGNOSIS — E785 Hyperlipidemia, unspecified: Secondary | ICD-10-CM | POA: Diagnosis not present

## 2022-03-13 DIAGNOSIS — N2889 Other specified disorders of kidney and ureter: Secondary | ICD-10-CM | POA: Diagnosis not present

## 2022-03-13 LAB — CBC WITH DIFFERENTIAL/PLATELET
Abs Immature Granulocytes: 0.01 10*3/uL (ref 0.00–0.07)
Basophils Absolute: 0.1 10*3/uL (ref 0.0–0.1)
Basophils Relative: 1 %
Eosinophils Absolute: 0.2 10*3/uL (ref 0.0–0.5)
Eosinophils Relative: 3 %
HCT: 48.2 % (ref 39.0–52.0)
Hemoglobin: 16.9 g/dL (ref 13.0–17.0)
Immature Granulocytes: 0 %
Lymphocytes Relative: 43 %
Lymphs Abs: 2.9 10*3/uL (ref 0.7–4.0)
MCH: 31.5 pg (ref 26.0–34.0)
MCHC: 35.1 g/dL (ref 30.0–36.0)
MCV: 89.9 fL (ref 80.0–100.0)
Monocytes Absolute: 0.5 10*3/uL (ref 0.1–1.0)
Monocytes Relative: 7 %
Neutro Abs: 3 10*3/uL (ref 1.7–7.7)
Neutrophils Relative %: 46 %
Platelets: 212 10*3/uL (ref 150–400)
RBC: 5.36 MIL/uL (ref 4.22–5.81)
RDW: 12.7 % (ref 11.5–15.5)
WBC: 6.6 10*3/uL (ref 4.0–10.5)
nRBC: 0 % (ref 0.0–0.2)

## 2022-03-13 LAB — COMPREHENSIVE METABOLIC PANEL
ALT: 33 U/L (ref 0–44)
AST: 32 U/L (ref 15–41)
Albumin: 4.9 g/dL (ref 3.5–5.0)
Alkaline Phosphatase: 82 U/L (ref 38–126)
Anion gap: 9 (ref 5–15)
BUN: 8 mg/dL (ref 8–23)
CO2: 25 mmol/L (ref 22–32)
Calcium: 9.3 mg/dL (ref 8.9–10.3)
Chloride: 99 mmol/L (ref 98–111)
Creatinine, Ser: 0.88 mg/dL (ref 0.61–1.24)
GFR, Estimated: >60 mL/min (ref >60)
Glucose, Bld: 98 mg/dL (ref 70–99)
Potassium: 4.3 mmol/L (ref 3.5–5.1)
Sodium: 133 mmol/L — ABNORMAL LOW (ref 135–145)
Total Bilirubin: 1.4 mg/dL — ABNORMAL HIGH (ref 0.3–1.2)
Total Protein: 7.7 g/dL (ref 6.5–8.1)

## 2022-03-13 MED ORDER — IOHEXOL 300 MG/ML  SOLN
100.0000 mL | Freq: Once | INTRAMUSCULAR | Status: AC | PRN
  Administered 2022-03-13: 100 mL via INTRAVENOUS

## 2022-03-15 LAB — CEA: CEA: 1.1 ng/mL (ref 0.0–4.7)

## 2022-03-19 ENCOUNTER — Inpatient Hospital Stay (HOSPITAL_BASED_OUTPATIENT_CLINIC_OR_DEPARTMENT_OTHER): Payer: Medicare HMO | Admitting: Hematology

## 2022-03-19 VITALS — BP 143/80 | HR 57 | Temp 97.9°F | Resp 16

## 2022-03-19 DIAGNOSIS — C182 Malignant neoplasm of ascending colon: Secondary | ICD-10-CM | POA: Diagnosis not present

## 2022-03-19 NOTE — Patient Instructions (Addendum)
[facility] at [facility] Discharge Instructions   You were seen and examined today by Dr. Delton Coombes.  He reviewed the results of your lab work which are normal.  He reviewed the results of your CT scan. From a colon cancer standpoint there is no evidence of disease. Your kidney mass has grown about 1/2 inch. We will let Dr. Alyson Ingles address this.   We will see you back in 6 months. We will repeat your lab work and scan prior to this visit.    Thank you for choosing [facility] at [facility] to provide your oncology and hematology care.  To afford each patient quality time with our provider, please arrive at least 15 minutes before your scheduled appointment time.   If you have a lab appointment with the [facility] please come in thru the Main Entrance and check in at the main information desk.  You need to re-schedule your appointment should you arrive 10 or more minutes late.  We strive to give you quality time with our providers, and arriving late affects you and other patients whose appointments are after yours.  Also, if you no show three or more times for appointments you may be dismissed from the clinic at the providers discretion.     Again, thank you for choosing [facility].  Our hope is that these requests will decrease the amount of time that you wait before being seen by our physicians.       _____________________________________________________________  Should you have questions after your visit to [facility], please contact our office at [phone] and follow the prompts.  Our office hours are 8:00 a.m. and 4:30 p.m. Monday - Friday.  Please note that voicemails left after 4:00 p.m. may not be returned until the following business day.  We are closed weekends and major holidays.  You do have access to a nurse 24-7, just call the main number to the clinic [phone] and do not press any options, hold  on the line and a nurse will answer the phone.    For prescription refill requests, have your pharmacy contact our office and allow 72 hours.    Due to Covid, you will need to wear a mask upon entering the hospital. If you do not have a mask, a mask will be given to you at the Main Entrance upon arrival. For doctor visits, patients may have 1 support person age 45 or older with them. For treatment visits, patients can not have anyone with them due to social distancing guidelines and our immunocompromised population.

## 2022-03-19 NOTE — Progress Notes (Signed)
[place], [place] [address]   CLINIC:  Medical Oncology/Hematology  PCP:  Jani Gravel, MD [address] Cathie Beams [place] [address] [phone]   REASON FOR VISIT:  Follow-up for stage II colon cancer and IDA  PRIOR THERAPY:  1. Right hemicolectomy on 11/10/2017. 2. Intermittent Feraheme last on 12/08/2017.  NGS Results: Foundation 1 MSI--stable  CURRENT THERAPY: surveillance  BRIEF ONCOLOGIC HISTORY:  Oncology History  Colon cancer ([place])  10/28/2017 Initial Diagnosis   Colon cancer ([place])   11/24/2017 Cancer Staging   Staging form: Colon and Rectum, AJCC 8th Edition - Clinical: Stage IIA (cT3, cN0, cM0) - Signed by [name], MD on 11/24/2017     CANCER STAGING:  Cancer Staging  Colon cancer [facility]) Staging form: Colon and Rectum, AJCC 8th Edition - Clinical: Stage IIA (cT3, cN0, cM0) - Signed by [name], MD on 11/24/2017   INTERVAL HISTORY:  Mr. TYRECK BELL, a 66 y.o. male, seen for follow-up of stage II colon cancer and iron deficiency anemia.  Denies any bleeding per rectum or melena.  He has some cough from allergies.  Energy levels are 75%.  Denies any hematuria.Marland Kitchen   REVIEW OF SYSTEMS:  Review of Systems  Constitutional:  Negative for appetite change and fatigue.  Respiratory:  Positive for cough.   Gastrointestinal:  Negative for abdominal pain, blood in stool, constipation and diarrhea.  All other systems reviewed and are negative.   PAST MEDICAL/SURGICAL HISTORY:  Past Medical History:  Diagnosis Date   Anemia    Cancer ([place])    Ascending colon s/p right hemicolectomy on 11/10/2017.    GERD (gastroesophageal reflux disease)    Hyperlipidemia    Lower back pain    Past Surgical History:  Procedure Laterality Date   BACK SURGERY     BIOPSY  10/17/2017   Procedure: BIOPSY;  Surgeon: Daneil Dolin, MD;  [facility];  Service: Endoscopy;;   COLON RESECTION N/A  11/10/2017   Procedure: LAPAROSCOPIC PARTIAL COLECTOMY;  Surgeon: [name], MD;  Location: [facility] ORS;  Service: General;  Laterality: N/A;   COLONOSCOPY N/A 10/17/2017   Procedure: COLONOSCOPY;  Surgeon: Daneil Dolin, MD; bulky apple core tumor in the vicinity of a sending colon.  Diverticulosis in sigmoid and descending colon.  Pathology with invasive adenocarcinoma.   COLONOSCOPY N/A 07/28/2019   Procedure: COLONOSCOPY;  Surgeon: Daneil Dolin, MD;  [facility];  Service: Endoscopy;  Laterality: N/A;  2:15pm   POLYPECTOMY  07/28/2019   Procedure: POLYPECTOMY;  Surgeon: Daneil Dolin, MD;  [facility];  Service: Endoscopy;;    SOCIAL HISTORY:  Social History   Socioeconomic History   Marital status: Single    Spouse name: Not on file   Number of children: Not on file   Years of education: Not on file   Highest education level: Not on file  Occupational History   Not on file  Tobacco Use   Smoking status: Never   Smokeless tobacco: Former    Types: Chew    Quit date: 11/25/1978  Vaping Use   Vaping Use: Never used  Substance and Sexual Activity   Alcohol use: No   Drug use: No   Sexual activity: Not on file  Other Topics Concern   Not on file  Social History Narrative   Not on file   Social Determinants of Health   Financial Resource Strain:  Not on file  Food Insecurity: Not on file  Transportation Needs: Not on file  Physical Activity: Not on file  Stress: Not on file  Social Connections: Not on file  Intimate Partner Violence: Not on file    FAMILY HISTORY:  Family History  Problem Relation Age of Onset   Stroke Mother    Alcohol abuse Father    Down syndrome Sister    Alcohol abuse Brother    Colon cancer Neg Hx    Ulcers Neg Hx     CURRENT MEDICATIONS:  Current Outpatient Medications  Medication Sig Dispense Refill   atorvastatin (LIPITOR) 20 MG tablet Take 20 mg by mouth daily.     diphenhydrAMINE (BENADRYL) 25 MG  tablet Take 25 mg by mouth every 6 (six) hours as needed for allergies.     lisinopril (ZESTRIL) 10 MG tablet Take 10 mg by mouth daily.     Omega-3 Fatty Acids (FISH OIL PO) Take 1 capsule by mouth 4 (four) times a week.      No current facility-administered medications for this visit.    ALLERGIES:  No Known Allergies  PHYSICAL EXAM:  Performance status (ECOG): 1 - Symptomatic but completely ambulatory  Vitals:   03/19/22 1140  BP: (!) 143/80  Pulse: (!) 57  Resp: 16  Temp: 97.9 F (36.6 C)  SpO2: 99%   Wt Readings from Last 3 Encounters:  09/19/21 180 lb 3 oz (81.7 kg)  03/14/21 175 lb 1.6 oz (79.4 kg)  09/04/20 176 lb (79.8 kg)   Physical Exam Vitals reviewed.  Constitutional:      Appearance: Normal appearance.  Cardiovascular:     Rate and Rhythm: Normal rate and regular rhythm.     Pulses: Normal pulses.     Heart sounds: Normal heart sounds.  Pulmonary:     Effort: Pulmonary effort is normal.     Breath sounds: Normal breath sounds.  Abdominal:     Palpations: Abdomen is soft. There is no hepatomegaly, splenomegaly or mass.     Tenderness: There is no abdominal tenderness.  Neurological:     General: No focal deficit present.     Mental Status: He is alert and oriented to person, place, and time.  Psychiatric:        Mood and Affect: Mood normal.        Behavior: Behavior normal.      LABORATORY DATA:  I have reviewed the labs as listed.     Latest Ref Rng & Units 03/13/2022   10:03 AM 09/12/2021    1:30 PM 03/07/2021    8:56 AM  CBC  WBC 4.0 - 10.5 K/uL 6.6  7.5  7.4   Hemoglobin 13.0 - 17.0 g/dL 16.9  16.1  17.0   Hematocrit 39.0 - 52.0 % 48.2  46.0  48.8   Platelets 150 - 400 K/uL 212  191  196       Latest Ref Rng & Units 03/13/2022   10:03 AM 09/12/2021    1:30 PM 03/07/2021    8:56 AM  CMP  Glucose 70 - 99 mg/dL 98  97  103   BUN 8 - 23 mg/dL '8  12  9   ' Creatinine 0.61 - 1.24 mg/dL 0.88  0.86  0.85   Sodium 135 - 145 mmol/L 133  135  134    Potassium 3.5 - 5.1 mmol/L 4.3  4.8  4.5   Chloride 98 - 111 mmol/L 99  102  100  CO2 22 - 32 mmol/L '25  25  26   ' Calcium 8.9 - 10.3 mg/dL 9.3  9.4  9.3   Total Protein 6.5 - 8.1 g/dL 7.7  7.4  7.4   Total Bilirubin 0.3 - 1.2 mg/dL 1.4  1.3  1.1   Alkaline Phos 38 - 126 U/L 82  79  81   AST 15 - 41 U/L 32  22  26   ALT 0 - 44 U/L 33  23  30     DIAGNOSTIC IMAGING:  I have independently reviewed the scans and discussed with the patient. CT Abdomen Pelvis W Contrast  Result Date: 03/14/2022 CLINICAL DATA:  Colon carcinoma. Surveillance. Follow-up right renal mass. * Tracking Code: BO * EXAM: CT ABDOMEN AND PELVIS WITH CONTRAST TECHNIQUE: Multidetector CT imaging of the abdomen and pelvis was performed using the standard protocol following bolus administration of intravenous contrast. RADIATION DOSE REDUCTION: This exam was performed according to the departmental dose-optimization program which includes automated exposure control, adjustment of the mA and/or kV according to patient size and/or use of iterative reconstruction technique. CONTRAST:  165m OMNIPAQUE IOHEXOL 300 MG/ML  SOLN COMPARISON:  03/07/2021 FINDINGS: Lower Chest: No acute findings. Hepatobiliary: No hepatic masses identified. Focal fatty infiltration is again seen in central left hepatic lobe adjacent to the porta hepatis. Gallbladder is unremarkable. No evidence of biliary ductal dilatation. Pancreas:  No mass or inflammatory changes. Spleen: Within normal limits in size and appearance. Adrenals/Urinary Tract: Heterogeneously enhancing mass in the posterior midpole of the right kidney has increased in size previous study, currently measuring 3.4 x 3.0 cm, compared to 2.1 x 1.6 cm previously. This is consistent with renal cell carcinoma. No evidence of ureteral calculi or hydronephrosis. Stomach/Bowel: Prior right colectomy again noted. No mass identified. No evidence of obstruction, inflammatory process or abnormal fluid  collections. Vascular/Lymphatic: No pathologically enlarged lymph nodes. No acute vascular findings. Aortic atherosclerotic calcification incidentally noted. Reproductive:  No mass or other significant abnormality. Other:  None. Musculoskeletal:  No suspicious bone lesions identified. IMPRESSION: No evidence of recurrent or metastatic colon carcinoma. Increased size of 3.4 cm right renal mass, consistent with renal cell carcinoma. Urology referral is recommended. No evidence of metastatic renal cell carcinoma. Aortic Atherosclerosis (ICD10-I70.0). These results will be called to the ordering clinician or representative by the Radiologist Assistant, and communication documented in the PACS or [employer] Electronically Signed   By: JMarlaine HindM.D.   On: 03/14/2022 13:33     ASSESSMENT:  1.  Stage II (T3N0) colon cancer: -Right hemicolectomy on 11/10/2017, MSI stable. -Colonoscopy on 07/28/2019 showed 5 mm polyp in the sigmoid colon consistent with tubular adenoma.  Exam otherwise normal. -MRI of the abdomen on 07/30/2019 did not show any evidence of metastatic disease.  CT scanning on 04/28/2019 also showed stable exam. -CTAP on 02/23/2020 did not show any evidence of metastatic disease.   2.  Right kidney mass: -MRI of the abdomen on 07/30/2019 showed 12 x 13 mm lesion in the posterior right lower kidney.  This is grossly unchanged from CT from 04/28/2019. -CT abdomen with and without contrast on 02/23/2020 showed right kidney solid enhancing mass measuring 1.4 x 1.6 cm.  New small low-density structure in the posterior cortex of the left inferior pole measures 5 mm, too small to characterize.   PLAN:  1.  Stage II colon cancer: - Denies any change in bowel habits or bleeding per rectum. - CTAP on 03/13/2022 shows no evidence of  recurrence or metastatic carcinoma. - Labs from 03/13/2022 shows CEA was normal at 1.1.  CBC and LFTs are normal. - Recommend follow-up in 6 months with repeat CTAP, and  labs including CEA.   2.  Right kidney mass: -CTAP on 03/13/2022 shows right kidney mass increased to 3.4 x 3 cm from 2.1 x 1.6 cm 1 year ago. - He has a follow-up with Dr. Alyson Ingles.   Orders placed this encounter:  Orders Placed This Encounter  Procedures   CT Abdomen Pelvis W Contrast   CBC with Differential   Comprehensive metabolic panel   CEA      [name], MD [place] [phone]

## 2022-03-27 ENCOUNTER — Encounter: Payer: Self-pay | Admitting: Urology

## 2022-03-27 ENCOUNTER — Ambulatory Visit: Payer: Medicare HMO | Admitting: Urology

## 2022-03-27 VITALS — BP 148/73 | HR 64

## 2022-03-27 DIAGNOSIS — Z85528 Personal history of other malignant neoplasm of kidney: Secondary | ICD-10-CM

## 2022-03-27 NOTE — Progress Notes (Signed)
03/27/2022 11:37 AM   Jack Gibbs 08/28/1955 [id number]  Referring provider: Jani Gravel, MD [address] [place],  [place] [address]  Followup right renal mass   HPI: Jack Gibbs is a 66yo here for followup for a right renal mass. CT abd 03/13/2022 shows the right renal mass has increased in size to 3.4cm. He denies any flank pain. NO hematuria. No significant LUTS. No other complaints today   PMH: Past Medical History:  Diagnosis Date   Anemia    Cancer ([place])    Ascending colon s/p right hemicolectomy on 11/10/2017.    GERD (gastroesophageal reflux disease)    Hyperlipidemia    Lower back pain     Surgical History: Past Surgical History:  Procedure Laterality Date   BACK SURGERY     BIOPSY  10/17/2017   Procedure: BIOPSY;  Surgeon: Daneil Dolin, MD;  [facility];  Service: Endoscopy;;   COLON RESECTION N/A 11/10/2017   Procedure: LAPAROSCOPIC PARTIAL COLECTOMY;  Surgeon: [name], MD;  Location: [facility];  Service: General;  Laterality: N/A;   COLONOSCOPY N/A 10/17/2017   Procedure: COLONOSCOPY;  Surgeon: Daneil Dolin, MD; bulky apple core tumor in the vicinity of a sending colon.  Diverticulosis in sigmoid and descending colon.  Pathology with invasive adenocarcinoma.   COLONOSCOPY N/A 07/28/2019   Procedure: COLONOSCOPY;  Surgeon: Daneil Dolin, MD;  [facility];  Service: Endoscopy;  Laterality: N/A;  2:15pm   POLYPECTOMY  07/28/2019   Procedure: POLYPECTOMY;  Surgeon: Daneil Dolin, MD;  [facility];  Service: Endoscopy;;    Home Medications:  Allergies as of 03/27/2022   No Known Allergies      Medication List        Accurate as of March 27, 2022 11:37 AM. If you have any questions, ask your nurse or doctor.          atorvastatin 20 MG tablet Commonly known as: LIPITOR Take 20 mg by mouth daily.   diphenhydrAMINE 25 MG tablet Commonly known as: BENADRYL Take 25 mg by mouth every  6 (six) hours as needed for allergies.   FISH OIL PO Take 1 capsule by mouth 4 (four) times a week.   lisinopril 10 MG tablet Commonly known as: ZESTRIL Take 10 mg by mouth daily.        Allergies: No Known Allergies  Family History: Family History  Problem Relation Age of Onset   Stroke Mother    Alcohol abuse Father    Down syndrome Sister    Alcohol abuse Brother    Colon cancer Neg Hx    Ulcers Neg Hx     Social History:  reports that he has never smoked. He quit smokeless tobacco use about 43 years ago.  His smokeless tobacco use included chew. He reports that he does not drink alcohol and does not use drugs.  ROS: All other review of systems were reviewed and are negative except what is noted above in HPI  Physical Exam: BP (!) 148/73   Pulse 64   Constitutional:  Alert and oriented, No acute distress. HEENT:  AT, moist mucus membranes.  Trachea midline, no masses. Cardiovascular: No clubbing, cyanosis, or edema. Respiratory: Normal respiratory effort, no increased work of breathing. GI: Abdomen is soft, nontender, nondistended, no abdominal masses GU: No CVA tenderness.  Lymph: No cervical or inguinal lymphadenopathy. Skin: No rashes, bruises or suspicious lesions. Neurologic: Grossly intact, no focal  deficits, moving all 4 extremities. Psychiatric: Normal mood and affect.  Laboratory Data: Lab Results  Component Value Date   WBC 6.6 03/13/2022   HGB 16.9 03/13/2022   HCT 48.2 03/13/2022   MCV 89.9 03/13/2022   PLT 212 03/13/2022    Lab Results  Component Value Date   CREATININE 0.88 03/13/2022    No results found for: "PSA"  No results found for: "TESTOSTERONE"  No results found for: "HGBA1C"  Urinalysis    Component Value Date/Time   APPEARANCEUR Cloudy (A) 09/26/2021 0931   GLUCOSEU Negative 09/26/2021 0931   BILIRUBINUR Negative 09/26/2021 0931   PROTEINUR Negative 09/26/2021 0931   NITRITE Negative 09/26/2021 0931   LEUKOCYTESUR  Negative 09/26/2021 0931    Lab Results  Component Value Date   LABMICR Comment 09/26/2021    Pertinent Imaging: CT abd 03/13/2022: Images reviewed and discussed with the patient  No results found for this or any previous visit.  No results found for this or any previous visit.  No results found for this or any previous visit.  No results found for this or any previous visit.  Results for orders placed during the hospital encounter of 09/19/21  Ultrasound renal complete  Narrative CLINICAL DATA:  History of nephrolithiasis.  EXAM: RENAL / URINARY TRACT ULTRASOUND COMPLETE  COMPARISON:  CT 03/07/2021.  Ultrasound 09/12/2020.  FINDINGS: Right Kidney:  Renal measurements: 11.0 x 4.9 x 5.6 cm = volume: 156.3 mL. Echogenicity within normal limits. 2.4 cm solid mass again noted in the right kidney. Again this is suspicious for renal cell carcinoma. Interval enlargement cannot be excluded. No hydronephrosis visualized.  Left Kidney:  Renal measurements: 11.4 x 5.4 x 5.6 cm = volume: 181.6 mL. Echogenicity within normal limits. No mass or hydronephrosis visualized.  Bladder:  Appears normal for degree of bladder distention.  Other:  None.  IMPRESSION: A 2.4 cm solid masses again noted in the right kidney. Again this is suspicious for renal cell carcinoma. Interval enlargement cannot be excluded.  2.  No acute abnormality.  No hydronephrosis or bladder distention.   Electronically Signed By: Marcello Moores  Register M.D. On: 09/20/2021 11:03  No valid procedures specified. No results found for this or any previous visit.  No results found for this or any previous visit.   Assessment & Plan:    1. History of kidney cancer We discussed the natural hx of renal masses and the 80/20 malignant/benign likelihood. We disucssed the treatment options including active surveillance. Renal ablation, partial and radical nephrectomy. After discussing the options the patient  elects for continued surveilalnce. RTC 6 months with renal US - Urinalysis, Routine w reflex microscopic - Ultrasound renal complete; Future   Return in about 6 months (around 09/26/2022) for renal US.  Nicolette Bang, MD  [facility] [place]

## 2022-03-27 NOTE — Patient Instructions (Signed)
Minimally Invasive Nephrectomy Minimally invasive nephrectomy is a surgical procedure to remove a kidney. This procedure is done through several small incisions in the abdomen. You may have surgery to: Remove the entire kidney and some surrounding structures (radical nephrectomy). Remove only the damaged or diseased part of the kidney (partial nephrectomy). You may need this surgery if: Your kidney is severely damaged from disease, infection, or cancer. You were born with an abnormal kidney. You are donating a healthy kidney. Tell a health care provider about: Any allergies you have. All medicines you are taking, including vitamins, herbs, eye drops, creams, and over-the-counter medicines. Any problems you or family members have had with anesthetic medicines. Any blood disorders you have. Any surgeries you have had. Any medical conditions you have. Whether you are pregnant or may be pregnant. What are the risks? Generally, this is a safe procedure. However, problems may occur, including: Bleeding. Infection. Damage to other body structures near the kidney. Urine leaking into the abdomen. Pneumonia. A blood clot that forms in the leg and travels to the lung (pulmonary embolism). Allergic reactions to medicines. A bone (usually part of a rib) or more tissue may need to be removed so the surgeon can get to the kidney. If this happens, the minimally invasive procedure may need to be changed to an open procedure. What happens before the procedure? Staying hydrated Follow instructions from your health care provider about hydration, which may include: Up to 2 hours before the procedure - you may continue to drink clear liquids, such as water, clear fruit juice, black coffee, and plain tea.  Eating and drinking restrictions Follow instructions from your health care provider about eating and drinking, which may include: 8 hours before the procedure - stop eating heavy meals or foods, such as  meat, fried foods, or fatty foods. 6 hours before the procedure - stop eating light meals or foods, such as toast or cereal. 6 hours before the procedure - stop drinking milk or drinks that contain milk. 2 hours before the procedure - stop drinking clear liquids. Medicines Ask your health care provider about: Changing or stopping your regular medicines. This is especially important if you are taking diabetes medicines or blood thinners. Taking medicines such as aspirin and ibuprofen. These medicines can thin your blood. Do not take these medicines unless your health care provider tells you to take them. Taking over-the-counter medicines, vitamins, herbs, and supplements. Surgery safety Ask your health care provider: How your surgery site will be marked. What steps will be taken to help prevent infection. These steps may include: Removing hair at the surgery site. Washing skin with a germ-killing soap. Taking antibiotic medicine. General instructions Do not use any products that contain nicotine or tobacco for at least 4 weeks before the procedure. These products include cigarettes, e-cigarettes, and chewing tobacco. If you need help quitting, ask your health care provider. Your health care provider may instruct you to do breathing exercises before the procedure to help prevent pneumonia. Do these exercises as directed. You may need to have your blood drawn (type and cross) in case you need to receive blood (get a transfusion) during the procedure. Plan to have a responsible adult take you home from the hospital or clinic. Plan to have a responsible adult care for you for the time you are told after you leave the hospital or clinic. This is important. What happens during the procedure?     Before surgery begins An IV will be inserted into one   of your veins. You will be given a medicine to make you fall asleep (general anesthetic). Your health care provider may take steps to help blood  flow in your legs. You may have: Tight stockings, also called compression stockings, on your legs. Compression sleeves wrapped around your legs. A machine called a sequential compression device (SCD) will pump air into the sleeves. A small, thin tube will be placed in your bladder (urinary catheter) to drain urine. A tube will be placed through your nose and into your stomach (nasogastric tube) to drain stomach fluids. During surgery A small incision will be made in your abdomen to insert a thin, lighted tube (laparoscope) with a camera attached. This lets your surgeon see your kidney during the procedure. More small incisions will be made to insert other surgical tools. One incision may be slightly larger to remove your kidney. In some cases, the surgeon will do the procedure by controlling tools that are attached to a surgical robot. This is called a robot-assisted nephrectomy. The next steps depend on the type of procedure you are having. If you are having a partial nephrectomy: The blood vessels attached to your kidney will be clamped. Part of your kidney will be removed. The kidney will be closed with stitches (sutures), and the clamp on the blood vessels will be removed. If you are having a radical nephrectomy: All of the blood vessels that attach to your kidney will be closed and cut. Part of the tube that carries urine from your kidney to your bladder (ureter) will be removed. Your kidney will be removed. All of the surgical tools will be removed from your body. A small tube (drain) may be placed near one of the incisions to drain extra fluid from the surgery area. The incisions may be closed with sutures or another type of closure. A bandage (dressing) will be placed over the incision area. The procedure may vary among health care providers and hospitals. What happens after the procedure? Your blood pressure, heart rate, breathing rate, and blood oxygen level will be monitored until  you leave the hospital or clinic. Your nasogastric tube will be removed. You may continue to have: An IV until you can drink fluids on your own. A urinary catheter. Your urine output will be checked. A drain. Your surgical drain will be removed after a few days. Your health care provider will tell you how to care for the drain at home. Pain medicine. This will be given through your IV. Compression stockings or compression sleeves.This helps to prevent blood clots and reduce swelling in your legs. You will be encouraged to: Get out of bed and walk as soon as possible. Do breathing exercises, such as coughing and breathing deeply. This helps prevent pneumonia. Summary Minimally invasive nephrectomy is a surgical procedure to remove a kidney. In some cases, only the damaged or diseased part of the kidney is removed. This procedure is done through several small incisions in the abdomen. Follow instructions from your health care provider about taking medicines and about eating and drinking before the procedure. You will be given a medicine to make you fall asleep (general anesthetic) during the procedure. This information is not intended to replace advice given to you by your health care provider. Make sure you discuss any questions you have with your health care provider. Document Revised: 09/20/2019 Document Reviewed: 09/20/2019 Elsevier Patient Education  2023 [employer]  

## 2022-03-28 LAB — URINALYSIS, ROUTINE W REFLEX MICROSCOPIC
Bilirubin, UA: NEGATIVE
Glucose, UA: NEGATIVE
Ketones, UA: NEGATIVE
Leukocytes,UA: NEGATIVE
Nitrite, UA: NEGATIVE
Protein,UA: NEGATIVE
RBC, UA: NEGATIVE
Specific Gravity, UA: 1.005 (ref 1.005–1.030)
Urobilinogen, Ur: 0.2 mg/dL (ref 0.2–1.0)
pH, UA: 5.5 (ref 5.0–7.5)

## 2022-05-19 IMAGING — US US RENAL
1 series · 14 of 25 positions shown · non-contrast
Comparison: CT 03/07/2021.  Ultrasound 09/12/2020.

CLINICAL DATA: History of nephrolithiasis.

EXAM:
RENAL / URINARY TRACT ULTRASOUND COMPLETE

[Series 1: us renal · 14 of 77 slices shown]
[im 1/77]
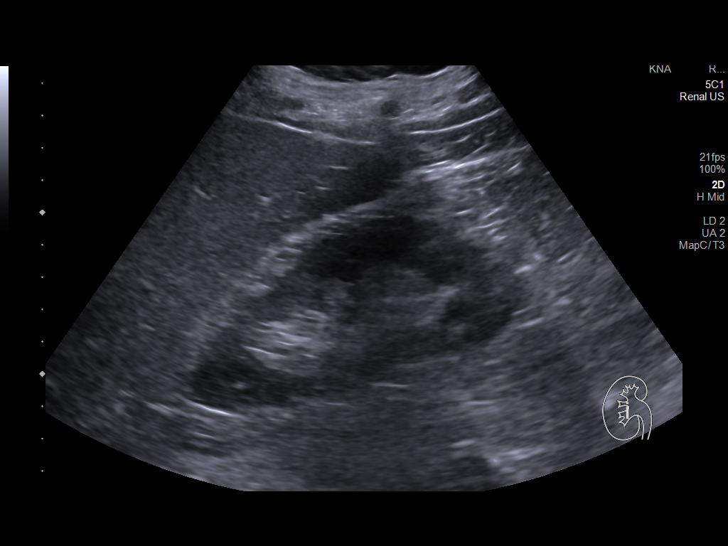
[im 7/77]
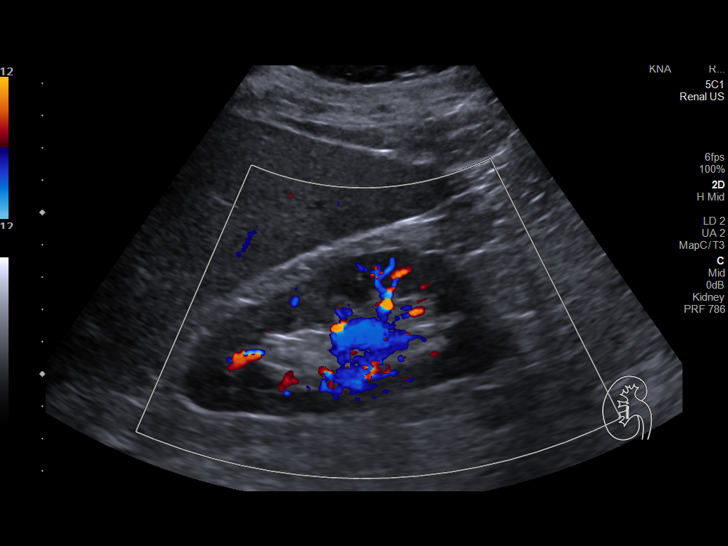
[im 13/77]
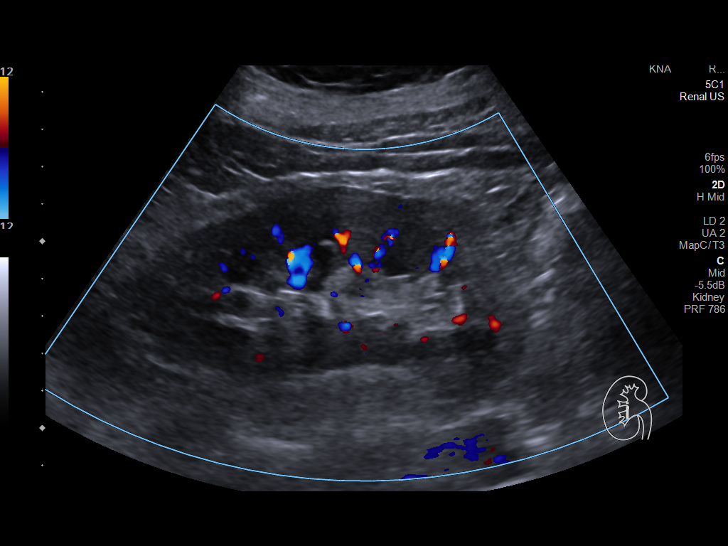
[im 20/77]
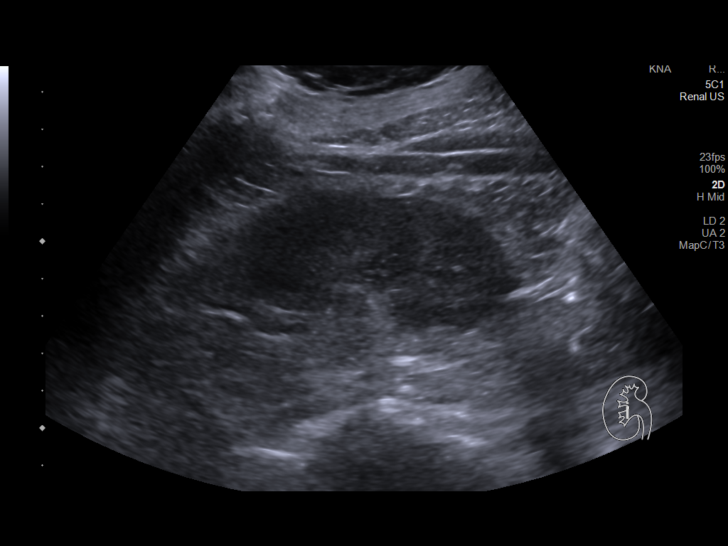
[im 26/77]
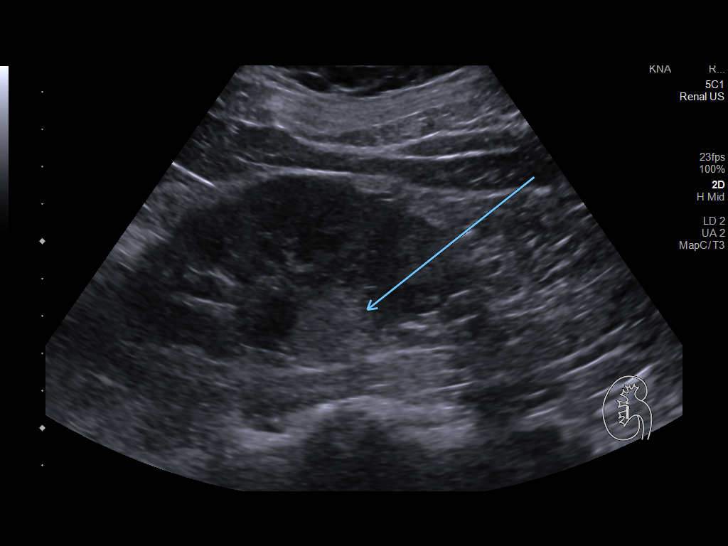
[im 29/77]
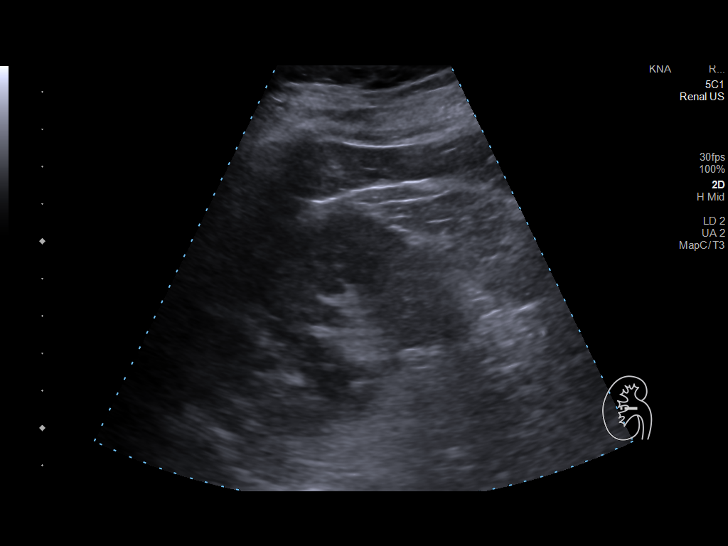
[im 35/77]
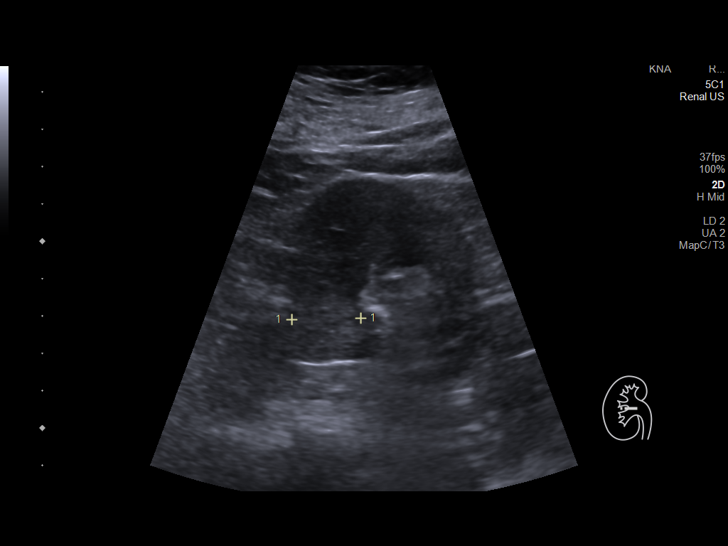
[im 42/77]
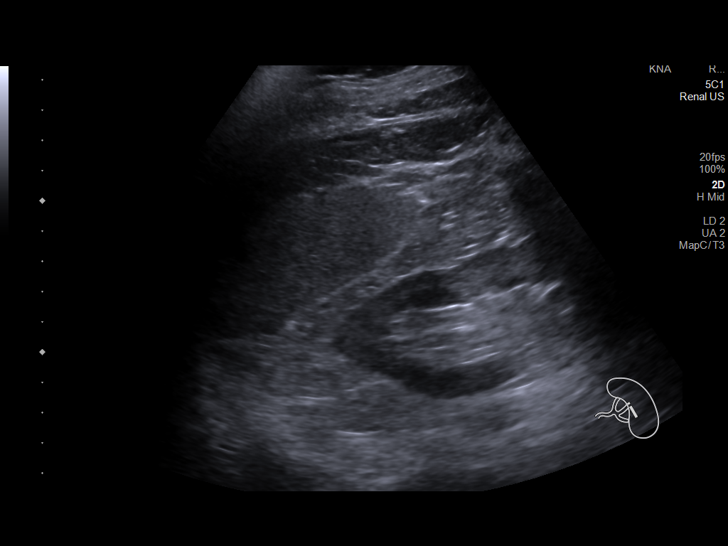
[im 48/77]
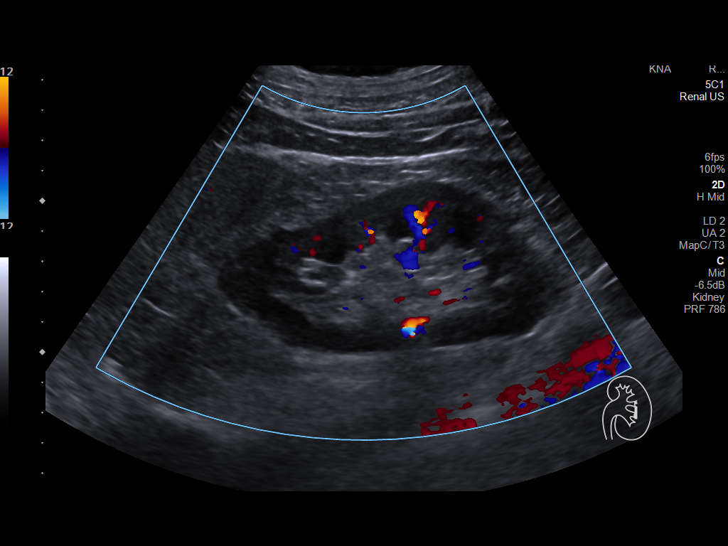
[im 51/77]
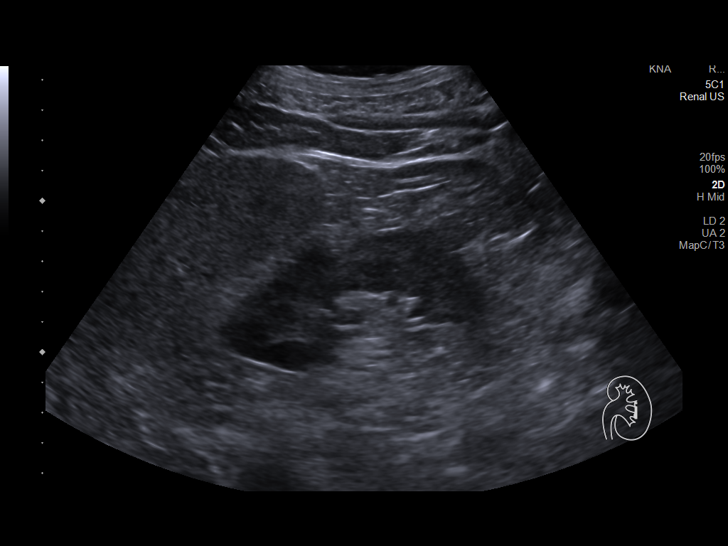
[im 58/77]
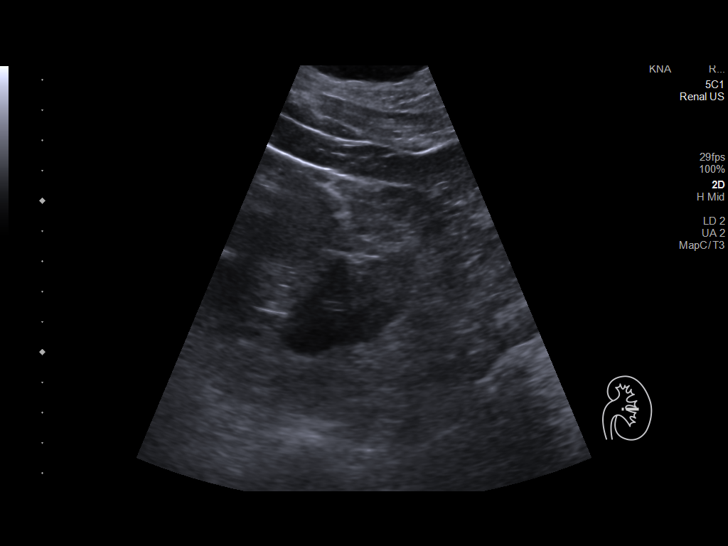
[im 64/77]
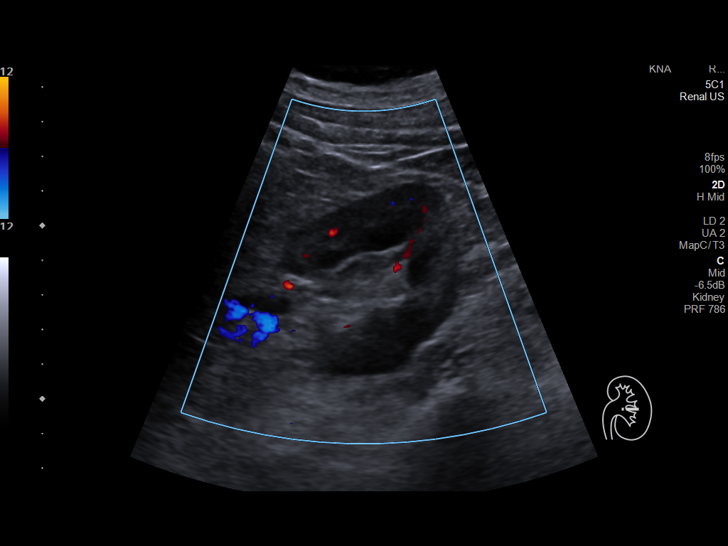
[im 70/77]
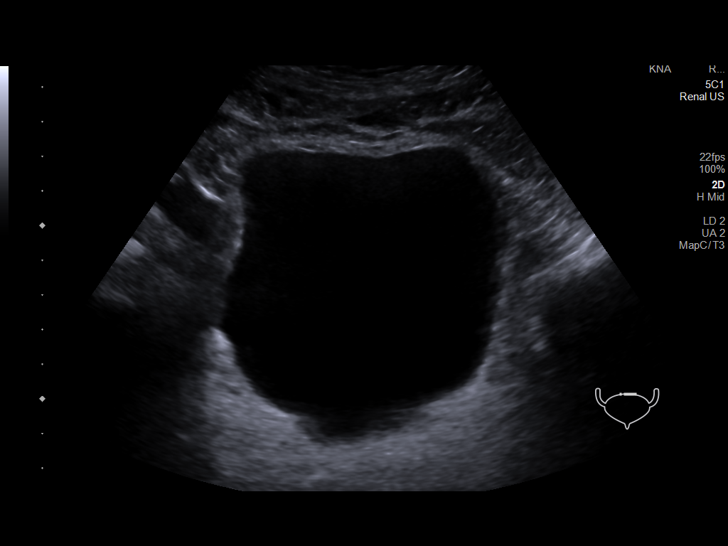
[im 77/77]
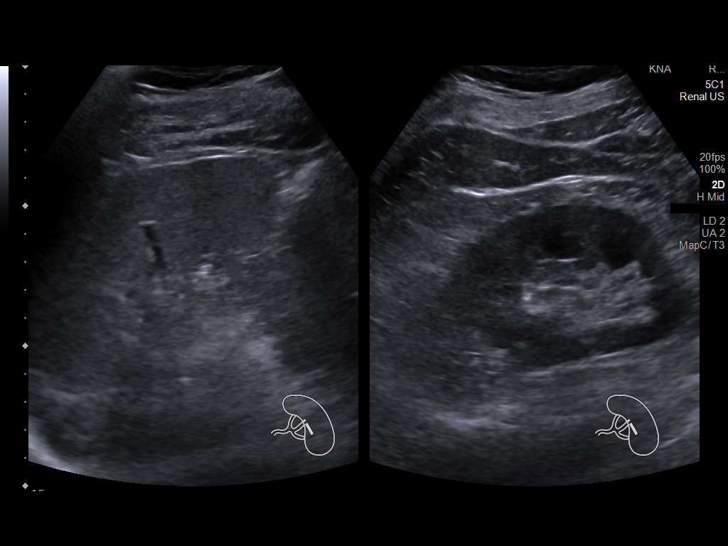

[14 of 25 positions shown; findings below may reference images not displayed]

FINDINGS: Right Kidney:

Renal measurements: 11.0 x 4.9 x 5.6 cm = volume: 156.3 mL.
Echogenicity within normal limits. 2.4 cm solid mass again noted in
the right kidney. Again this is suspicious for renal cell carcinoma.
Interval enlargement cannot be excluded. No hydronephrosis
visualized.

Left Kidney:

Renal measurements: 11.4 x 5.4 x 5.6 cm = volume: 181.6 mL.
Echogenicity within normal limits. No mass or hydronephrosis
visualized.

Bladder:

Appears normal for degree of bladder distention.

Other:

None.
IMPRESSION: A 2.4 cm solid masses again noted in the right kidney. Again this is
suspicious for renal cell carcinoma. Interval enlargement cannot be
excluded.

2.  No acute abnormality.  No hydronephrosis or bladder distention.

## 2022-07-01 ENCOUNTER — Encounter: Payer: Self-pay | Admitting: *Deleted

## 2022-09-16 ENCOUNTER — Ambulatory Visit (HOSPITAL_COMMUNITY)
Admission: RE | Admit: 2022-09-16 | Discharge: 2022-09-16 | Disposition: A | Discharge status: Home / Self Care | Payer: Medicare HMO | Source: Ambulatory Visit | Attending: Hematology | Admitting: Hematology

## 2022-09-16 ENCOUNTER — Inpatient Hospital Stay: Payer: Medicare HMO | Attending: Hematology

## 2022-09-16 DIAGNOSIS — Z79899 Other long term (current) drug therapy: Secondary | ICD-10-CM | POA: Insufficient documentation

## 2022-09-16 DIAGNOSIS — Z87891 Personal history of nicotine dependence: Secondary | ICD-10-CM | POA: Insufficient documentation

## 2022-09-16 DIAGNOSIS — Z85038 Personal history of other malignant neoplasm of large intestine: Secondary | ICD-10-CM | POA: Diagnosis present

## 2022-09-16 DIAGNOSIS — D509 Iron deficiency anemia, unspecified: Secondary | ICD-10-CM | POA: Diagnosis present

## 2022-09-16 DIAGNOSIS — C182 Malignant neoplasm of ascending colon: Secondary | ICD-10-CM | POA: Insufficient documentation

## 2022-09-16 LAB — COMPREHENSIVE METABOLIC PANEL
ALT: 20 U/L (ref 0–44)
AST: 20 U/L (ref 15–41)
Albumin: 4.8 g/dL (ref 3.5–5.0)
Alkaline Phosphatase: 84 U/L (ref 38–126)
Anion gap: 7 (ref 5–15)
BUN: 10 mg/dL (ref 8–23)
CO2: 25 mmol/L (ref 22–32)
Calcium: 9.2 mg/dL (ref 8.9–10.3)
Chloride: 101 mmol/L (ref 98–111)
Creatinine, Ser: 0.91 mg/dL (ref 0.61–1.24)
GFR, Estimated: >60 mL/min (ref >60)
Glucose, Bld: 104 mg/dL — ABNORMAL HIGH (ref 70–99)
Potassium: 4.8 mmol/L (ref 3.5–5.1)
Sodium: 133 mmol/L — ABNORMAL LOW (ref 135–145)
Total Bilirubin: 1.1 mg/dL (ref 0.3–1.2)
Total Protein: 7.6 g/dL (ref 6.5–8.1)

## 2022-09-16 LAB — CBC WITH DIFFERENTIAL/PLATELET
Abs Immature Granulocytes: 0.02 10*3/uL (ref 0.00–0.07)
Basophils Absolute: 0.1 10*3/uL (ref 0.0–0.1)
Basophils Relative: 1 %
Eosinophils Absolute: 0.2 10*3/uL (ref 0.0–0.5)
Eosinophils Relative: 2 %
HCT: 49.5 % (ref 39.0–52.0)
Hemoglobin: 16.9 g/dL (ref 13.0–17.0)
Immature Granulocytes: 0 %
Lymphocytes Relative: 36 %
Lymphs Abs: 2.4 10*3/uL (ref 0.7–4.0)
MCH: 30.8 pg (ref 26.0–34.0)
MCHC: 34.1 g/dL (ref 30.0–36.0)
MCV: 90.2 fL (ref 80.0–100.0)
Monocytes Absolute: 0.4 10*3/uL (ref 0.1–1.0)
Monocytes Relative: 7 %
Neutro Abs: 3.6 10*3/uL (ref 1.7–7.7)
Neutrophils Relative %: 54 %
Platelets: 189 10*3/uL (ref 150–400)
RBC: 5.49 MIL/uL (ref 4.22–5.81)
RDW: 12.3 % (ref 11.5–15.5)
WBC: 6.6 10*3/uL (ref 4.0–10.5)
nRBC: 0 % (ref 0.0–0.2)

## 2022-09-16 MED ORDER — IOHEXOL 300 MG/ML  SOLN
100.0000 mL | Freq: Once | INTRAMUSCULAR | Status: AC | PRN
  Administered 2022-09-16: 100 mL via INTRAVENOUS

## 2022-09-17 ENCOUNTER — Other Ambulatory Visit: Payer: Medicare HMO

## 2022-09-17 LAB — CEA: CEA: 1.2 ng/mL (ref 0.0–4.7)

## 2022-09-22 NOTE — Progress Notes (Signed)
[facility] [address] [place] [address]    Clinic Day:  10/04/2022  Referring physician: Pearson Grippe, MD  Patient Care Team: Pcp, No as PCP - General Corbin Ade, MD as Consulting Physician (Gastroenterology)   ASSESSMENT & PLAN:   Assessment: 1.  Stage II (T3N0) colon cancer: -Right hemicolectomy on 11/10/2017, MSI stable. -Colonoscopy on 07/28/2019 showed 5 mm polyp in the sigmoid colon consistent with tubular adenoma.  Exam otherwise normal.   2.  Social/family history: - He is a non-smoker but chewed tobacco and quit at age 72.  No family history of kidney cancer.   Plan: 1.  Stage II colon cancer: - Denies any change in bowel habits.  No bleeding per rectum. - Reviewed labs from 09/16/2022 with normal LFTs.  CBC was normal.  CEA is 1.2. - CTAP on 09/16/2022: No evidence of recurrence of colon cancer. - RTC 6 months for follow-up with CEA level and CT scan.   2.  Right kidney mass: - CTAP on 09/16/2022: Slight increase in size of right renal mass 4.1 x 3 cm prior 3.4 x 3 cm. - He will follow-up with Dr. Ronne Binning. - If he has nephrectomy, he will follow-up with me a month after surgery to see if he is a candidate for adjuvant pembrolizumab.  Orders Placed This Encounter  Procedures   CT CHEST ABDOMEN PELVIS W CONTRAST    Standing Status:   Future    Standing Expiration Date:   09/23/2023    Order Specific Question:   If indicated for the ordered procedure, I authorize the administration of contrast media per Radiology protocol    Answer:   Yes    Order Specific Question:   Does the patient have a contrast media/X-ray dye allergy?    Answer:   No    Order Specific Question:   Preferred imaging location?    Answer:   [facility]    Order Specific Question:   Release to patient    Answer:   Immediate    Order Specific Question:   Is Oral Contrast requested for this exam?    Answer:   Yes, Per Radiology protocol   CBC with  Differential/Platelet    Standing Status:   Future    Standing Expiration Date:   09/23/2023    Order Specific Question:   Release to patient    Answer:   Immediate   Comprehensive metabolic panel    Standing Status:   Future    Standing Expiration Date:   09/23/2023    Order Specific Question:   Release to patient    Answer:   Immediate   CEA    Standing Status:   Future    Standing Expiration Date:   09/23/2023      I,Katie Daubenspeck,acting as a scribe for Doreatha Massed, MD.,have documented all relevant documentation on the behalf of Doreatha Massed, MD,as directed by  Doreatha Massed, MD while in the presence of Doreatha Massed, MD.   I, Doreatha Massed MD, have reviewed the above documentation for accuracy and completeness, and I agree with the above.   Doreatha Massed, MD   4/26/20246:30 PM  CHIEF COMPLAINT:   Diagnosis: stage II colon cancer and IDA    Cancer Staging  Colon cancer [facility]) Staging form: Colon and Rectum, AJCC 8th Edition - Clinical: Stage IIA (cT3, cN0, cM0) - Signed by Doreatha Massed, MD on 11/24/2017    Prior Therapy: 1. Right hemicolectomy  on 11/10/2017. 2. Intermittent Feraheme last on 12/08/2017.  Current Therapy:  surveillance    HISTORY OF PRESENT ILLNESS:   Oncology History  Colon cancer (HCC)  10/28/2017 Initial Diagnosis   Colon cancer (HCC)   11/24/2017 Cancer Staging   Staging form: Colon and Rectum, AJCC 8th Edition - Clinical: Stage IIA (cT3, cN0, cM0) - Signed by Doreatha Massed, MD on 11/24/2017      INTERVAL HISTORY:   Clive is a 67 y.o. male presenting to clinic today for follow up of stage II colon cancer and IDA. He was last seen by me on 03/19/22.  Since his last visit, he underwent surveillance CT A/P on 09/16/22 showing: further progression of right renal mass, measuring 4.1 cm (previously 3.4 cm), now nearly abuts right psoas muscle and extends deep to close proximity of central  renal sinus; no evidence of colon cancer recurrence or metastatic disease.  Of note, he also underwent renal US earlier this afternoon.  Today, he states that he is doing well overall. His appetite level is at 100%. His energy level is at 85%.  PAST MEDICAL HISTORY:   Past Medical History: Past Medical History:  Diagnosis Date   Anemia    Cancer (HCC)    Ascending colon s/p right hemicolectomy on 11/10/2017.    GERD (gastroesophageal reflux disease)    Hyperlipidemia    Lower back pain     Surgical History: Past Surgical History:  Procedure Laterality Date   BACK SURGERY     BIOPSY  10/17/2017   Procedure: BIOPSY;  Surgeon: Corbin Ade, MD;  Location: [facility];  Service: Endoscopy;;   COLON RESECTION N/A 11/10/2017   Procedure: LAPAROSCOPIC PARTIAL COLECTOMY;  Surgeon: Lucretia Roers, MD;  Location: [facility] OR[facility];  Service: General;  Laterality: N/A;   COLONOSCOPY N/A 10/17/2017   Procedure: COLONOSCOPY;  Surgeon: Corbin Ade, MD; bulky apple core tumor in the vicinity of a sending colon.  Diverticulosis in sigmoid and descending colon.  Pathology with invasive adenocarcinoma.   COLONOSCOPY N/A 07/28/2019   Procedure: COLONOSCOPY;  Surgeon: Corbin Ade, MD;  Location: [facility];  Service: Endoscopy;  Laterality: N/A;  2:15pm   POLYPECTOMY  07/28/2019   Procedure: POLYPECTOMY;  Surgeon: Corbin Ade, MD;  Location: [facility];  Service: Endoscopy;;    Social History: Social History   Socioeconomic History   Marital status: Single    Spouse name: Not on file   Number of children: Not on file   Years of education: Not on file   Highest education level: Not on file  Occupational History   Not on file  Tobacco Use   Smoking status: Never   Smokeless tobacco: Former    Types: Chew    Quit date: 11/25/1978  Vaping Use   Vaping Use: Never used  Substance and Sexual Activity   Alcohol use: No   Drug use: No   Sexual activity: Not on file  Other Topics  Concern   Not on file  Social History Narrative   Not on file   Social Determinants of Health   Financial Resource Strain: Not on file  Food Insecurity: Not on file  Transportation Needs: Not on file  Physical Activity: Not on file  Stress: Not on file  Social Connections: Not on file  Intimate Partner Violence: Not on file    Family History: Family History  Problem Relation Age of Onset   Stroke Mother    Alcohol abuse Father  Down syndrome Sister    Alcohol abuse Brother    Colon cancer Neg Hx    Ulcers Neg Hx     Current Medications:  Current Outpatient Medications:    atorvastatin (LIPITOR) 20 MG tablet, Take 20 mg by mouth daily., Disp: , Rfl:    diphenhydrAMINE (BENADRYL) 25 MG tablet, Take 25 mg by mouth every 6 (six) hours as needed for allergies., Disp: , Rfl:    lisinopril (ZESTRIL) 10 MG tablet, Take 10 mg by mouth daily., Disp: , Rfl:    Omega-3 Fatty Acids (FISH OIL PO), Take 1 capsule by mouth 4 (four) times a week. , Disp: , Rfl:    Allergies: No Known Allergies  REVIEW OF SYSTEMS:   Review of Systems  Constitutional:  Negative for chills, fatigue and fever.  HENT:   Negative for lump/mass, mouth sores, nosebleeds, sore throat and trouble swallowing.   Eyes:  Negative for eye problems.  Respiratory:  Negative for cough and shortness of breath.   Cardiovascular:  Negative for chest pain, leg swelling and palpitations.  Gastrointestinal:  Negative for abdominal pain, constipation, diarrhea, nausea and vomiting.  Genitourinary:  Negative for bladder incontinence, difficulty urinating, dysuria, frequency, hematuria and nocturia.   Musculoskeletal:  Negative for arthralgias, back pain, flank pain, myalgias and neck pain.  Skin:  Negative for itching and rash.  Neurological:  Negative for dizziness, headaches and numbness.  Hematological:  Does not bruise/bleed easily.  Psychiatric/Behavioral:  Negative for depression, sleep disturbance and suicidal  ideas. The patient is not nervous/anxious.   All other systems reviewed and are negative.    VITALS:   Blood pressure 136/76, pulse (!) 56, temperature 98.6 F (37 C), temperature source Tympanic, resp. rate 18, height 5\' 6"  (1.676 m), weight 176 lb 12.8 oz (80.2 kg), SpO2 97 %.  Wt Readings from Last 3 Encounters:  09/23/22 176 lb 12.8 oz (80.2 kg)  09/19/21 180 lb 3 oz (81.7 kg)  03/14/21 175 lb 1.6 oz (79.4 kg)    Body mass index is 28.54 kg/m.  Performance status (ECOG): 1 - Symptomatic but completely ambulatory  PHYSICAL EXAM:   Physical Exam Vitals and nursing note reviewed. Exam conducted with a chaperone present.  Constitutional:      Appearance: Normal appearance.  Cardiovascular:     Rate and Rhythm: Normal rate and regular rhythm.     Pulses: Normal pulses.     Heart sounds: Normal heart sounds.  Pulmonary:     Effort: Pulmonary effort is normal.     Breath sounds: Normal breath sounds.  Abdominal:     Palpations: Abdomen is soft. There is no hepatomegaly, splenomegaly or mass.     Tenderness: There is no abdominal tenderness.  Musculoskeletal:     Right lower leg: No edema.     Left lower leg: No edema.  Lymphadenopathy:     Cervical: No cervical adenopathy.     Right cervical: No superficial, deep or posterior cervical adenopathy.    Left cervical: No superficial, deep or posterior cervical adenopathy.     Upper Body:     Right upper body: No supraclavicular or axillary adenopathy.     Left upper body: No supraclavicular or axillary adenopathy.  Neurological:     General: No focal deficit present.     Mental Status: He is alert and oriented to person, place, and time.  Psychiatric:        Mood and Affect: Mood normal.  Behavior: Behavior normal.     LABS:      Latest Ref Rng & Units 09/16/2022   10:20 AM 03/13/2022   10:03 AM 09/12/2021    1:30 PM  CBC  WBC 4.0 - 10.5 K/uL 6.6  6.6  7.5   Hemoglobin 13.0 - 17.0 g/dL 16.1  09.6  04.5    Hematocrit 39.0 - 52.0 % 49.5  48.2  46.0   Platelets 150 - 400 K/uL 189  212  191       Latest Ref Rng & Units 09/16/2022   10:20 AM 03/13/2022   10:03 AM 09/12/2021    1:30 PM  CMP  Glucose 70 - 99 mg/dL 409  98  97   BUN 8 - 23 mg/dL 10  8  12    Creatinine 0.61 - 1.24 mg/dL 8.11  9.14  7.82   Sodium 135 - 145 mmol/L 133  133  135   Potassium 3.5 - 5.1 mmol/L 4.8  4.3  4.8   Chloride 98 - 111 mmol/L 101  99  102   CO2 22 - 32 mmol/L 25  25  25    Calcium 8.9 - 10.3 mg/dL 9.2  9.3  9.4   Total Protein 6.5 - 8.1 g/dL 7.6  7.7  7.4   Total Bilirubin 0.3 - 1.2 mg/dL 1.1  1.4  1.3   Alkaline Phos 38 - 126 U/L 84  82  79   AST 15 - 41 U/L 20  32  22   ALT 0 - 44 U/L 20  33  23      Lab Results  Component Value Date   CEA1 1.2 09/16/2022   /  CEA  Date Value Ref Range Status  09/16/2022 1.2 0.0 - 4.7 ng/mL Final    Comment:    (NOTE)                             Nonsmokers          <3.9                             Smokers             <5.6 Roche [employer] Electrochemiluminescence Immunoassay (ECLIA) Values obtained with different assay methods or kits cannot be used interchangeably.  Results cannot be interpreted as absolute evidence of the presence or absence of malignant disease. Performed At: [facility] [address] [place], [place] [id number] Jolene Schimke MD VH:[id number]    No results found for: "PSA1" No results found for: "[id number]" No results found for: "CAN125"  No results found for: "TOTALPROTELP", "ALBUMINELP", "A1GS", "A2GS", "BETS", "BETA2SER", "GAMS", "MSPIKE", "SPEI" Lab Results  Component Value Date   TIBC 493 (H) 11/24/2017   FERRITIN 113 01/27/2019   FERRITIN 92 10/27/2018   FERRITIN 56 04/27/2018   IRONPCTSAT 3 (L) 11/24/2017   Lab Results  Component Value Date   LDH 142 01/27/2019   LDH 155 10/27/2018   LDH 149 04/27/2018     STUDIES:   Ultrasound renal complete  Result Date: 09/23/2022 CLINICAL DATA:  Renal mass EXAM:  RENAL / URINARY TRACT ULTRASOUND COMPLETE COMPARISON:  CT abdomen pelvis 09/16/2022; renal ultrasound 09/19/2021 FINDINGS: Right Kidney: Renal measurements: 11.1 x 5.3 x 4.8 cm = volume: 148.2 mL. Normal renal cortical thickness and echogenicity. No hydronephrosis. There is a 4.4 x 2.4 x 3.2 cm mixed  echogenicity mass midpole right kidney, previously measuring 4.1 x 3.0 cm on prior CT. Left Kidney: Renal measurements: 11.8 x 5.8 x 5.3 cm = volume: 190.7 mL. Echogenicity within normal limits. No mass or hydronephrosis visualized. Bladder: Appears normal for degree of bladder distention. Other: None. IMPRESSION: Mixed echogenicity mass midpole right kidney measures up to 4.4 cm, previously measuring up to 4.1 cm on CT from 09/16/2022. This remains concerning for renal cell carcinoma. Electronically Signed   By: Annia Belt M.D.   On: 09/23/2022 14:28   CT Abdomen Pelvis W Contrast  Result Date: 09/17/2022 CLINICAL DATA:  Colon cancer restaging.  * Tracking Code: BO * EXAM: CT ABDOMEN AND PELVIS WITH CONTRAST TECHNIQUE: Multidetector CT imaging of the abdomen and pelvis was performed using the standard protocol following bolus administration of intravenous contrast. RADIATION DOSE REDUCTION: This exam was performed according to the departmental dose-optimization program which includes automated exposure control, adjustment of the mA and/or kV according to patient size and/or use of iterative reconstruction technique. CONTRAST:  OMNIPAQUE IOHEXOL 300 MG/ML  SOLN COMPARISON:  03/13/2022 FINDINGS: Lower chest: Unremarkable. Hepatobiliary: No suspicious focal abnormality within the liver parenchyma. There is no evidence for gallstones, gallbladder wall thickening, or pericholecystic fluid. No intrahepatic or extrahepatic biliary dilation. Pancreas: No focal mass lesion. No dilatation of the main duct. No intraparenchymal cyst. No peripancreatic edema. Spleen: No splenomegaly. No focal mass lesion. Adrenals/Urinary  Tract: No adrenal nodule or mass. Further progression of the heterogeneously enhancing right renal mass in the medial lower pole measuring 4.1 x 3.0 cm today compared to 3.4 x 3.0 cm previously. Lesion now nearly abuts the right psoas muscle and extends deep to close proximity of the central sinus. Left kidney unremarkable. No evidence for hydroureter. The urinary bladder appears normal for the degree of distention. Stomach/Bowel: Stomach is unremarkable. No gastric wall thickening. No evidence of outlet obstruction. Duodenum is normally positioned as is the ligament of Treitz. No small bowel wall thickening. No small bowel dilatation. Right hemicolectomy. No gross colonic mass. No colonic wall thickening. Vascular/Lymphatic: There is moderate atherosclerotic calcification of the abdominal aorta without aneurysm. There is no gastrohepatic or hepatoduodenal ligament lymphadenopathy. No retroperitoneal or mesenteric lymphadenopathy. No pelvic sidewall lymphadenopathy. Reproductive: The prostate gland and seminal vesicles are unremarkable. Other: No intraperitoneal free fluid. Musculoskeletal: No worrisome lytic or sclerotic osseous abnormality. IMPRESSION: 1. Further progression of the heterogeneously enhancing right renal mass in the medial lower pole measuring 4.1 x 3.0 cm today compared to 3.4 x 3.0 cm previously. Lesion now nearly abuts the right psoas muscle and extends deep to close proximity of the central renal sinus. 2. No evidence for colon cancer recurrence or metastatic disease. 3.  Aortic Atherosclerosis (ICD10-I70.0). Electronically Signed   By: Kennith Center M.D.   On: 09/17/2022 10:13

## 2022-09-23 ENCOUNTER — Ambulatory Visit (HOSPITAL_COMMUNITY)
Admission: RE | Admit: 2022-09-23 | Discharge: 2022-09-23 | Disposition: A | Discharge status: Home / Self Care | Payer: Medicare HMO | Source: Ambulatory Visit | Attending: Urology | Admitting: Urology

## 2022-09-23 ENCOUNTER — Inpatient Hospital Stay (HOSPITAL_BASED_OUTPATIENT_CLINIC_OR_DEPARTMENT_OTHER): Payer: Medicare HMO | Admitting: Hematology

## 2022-09-23 VITALS — BP 136/76 | HR 56 | Temp 98.6°F | Resp 18 | Ht 66.0 in | Wt 176.8 lb

## 2022-09-23 DIAGNOSIS — Z85528 Personal history of other malignant neoplasm of kidney: Secondary | ICD-10-CM | POA: Diagnosis present

## 2022-09-23 DIAGNOSIS — C182 Malignant neoplasm of ascending colon: Secondary | ICD-10-CM

## 2022-09-23 DIAGNOSIS — Z85038 Personal history of other malignant neoplasm of large intestine: Secondary | ICD-10-CM | POA: Diagnosis not present

## 2022-09-23 NOTE — Patient Instructions (Signed)
[facility]  Discharge Instructions  You were seen and examined today by Dr. Ellin Saba.  Dr. Ellin Saba discussed your most recent lab work and CT scan which revealed that the spot on your kidney has grown.   Dr. Ellin Saba wants you to follow up a month after surgery.   Follow-up as scheduled.    Thank you for choosing [facility] to provide your oncology and hematology care.   To afford each patient quality time with our provider, please arrive at least 15 minutes before your scheduled appointment time. You may need to reschedule your appointment if you arrive late (10 or more minutes). Arriving late affects you and other patients whose appointments are after yours.  Also, if you miss three or more appointments without notifying the office, you may be dismissed from the clinic at the provider's discretion.    Again, thank you for choosing [facility].  Our hope is that these requests will decrease the amount of time that you wait before being seen by our physicians.   If you have a lab appointment with the Cancer Center - please note that after April 8th, all labs will be drawn in the cancer center.  You do not have to check in or register with the main entrance as you have in the past but will complete your check-in at the cancer center.            _____________________________________________________________  Should you have questions after your visit to [facility], please contact our office at [phone] and follow the prompts.  Our office hours are 8:00 a.m. to 4:30 p.m. Monday - Thursday and 8:00 a.m. to 2:30 p.m. Friday.  Please note that voicemails left after 4:00 p.m. may not be returned until the following business day.  We are closed weekends and all major holidays.  You do have access to a nurse 24-7, just call the main number to the clinic [phone] and do not press any options, hold on the  line and a nurse will answer the phone.    For prescription refill requests, have your pharmacy contact our office and allow 72 hours.    Masks are no longer required in the cancer centers. If you would like for your care team to wear a mask while they are taking care of you, please let them know. You may have one support person who is at least 68 years old accompany you for your appointments.

## 2022-09-27 ENCOUNTER — Ambulatory Visit: Payer: Medicare HMO | Admitting: Urology

## 2022-09-27 VITALS — BP 145/70 | HR 55

## 2022-09-27 DIAGNOSIS — N2889 Other specified disorders of kidney and ureter: Secondary | ICD-10-CM

## 2022-09-27 DIAGNOSIS — Z85528 Personal history of other malignant neoplasm of kidney: Secondary | ICD-10-CM | POA: Diagnosis not present

## 2022-09-27 LAB — MICROSCOPIC EXAMINATION
Bacteria, UA: NONE SEEN
Mucus, UA: NONE SEEN
RBC, Urine: NONE SEEN /hpf (ref 0–2)
Urinalysis Comments: 0

## 2022-09-27 LAB — URINALYSIS, ROUTINE W REFLEX MICROSCOPIC
Bilirubin, UA: NEGATIVE
Glucose, UA: NEGATIVE
Ketones, UA: NEGATIVE
Nitrite, UA: NEGATIVE
Protein,UA: NEGATIVE
RBC, UA: NEGATIVE
Specific Gravity, UA: <1.005 — ABNORMAL LOW (ref 1.005–1.030)
Urobilinogen, Ur: 0.2 mg/dL (ref 0.2–1.0)
pH, UA: 5.5 (ref 5.0–7.5)

## 2022-09-27 NOTE — Progress Notes (Unsigned)
09/27/2022 10:09 AM   Jack Gibbs 12-21-1955 [id number]  Referring provider: Pearson Grippe, MD [address] [place],  [place] [address]  Right renal mass   HPI: [name] is a 66yo here for followup for a right renal mass. The mass increased to 4.1cm from 3.4cm on CT. NO significant LUTS. No hematuria    PMH: Past Medical History:  Diagnosis Date   Anemia    Cancer (HCC)    Ascending colon s/p right hemicolectomy on 11/10/2017.    GERD (gastroesophageal reflux disease)    Hyperlipidemia    Lower back pain     Surgical History: Past Surgical History:  Procedure Laterality Date   BACK SURGERY     BIOPSY  10/17/2017   Procedure: BIOPSY;  Surgeon: Corbin Ade, MD;  [facility];  Service: Endoscopy;;   COLON RESECTION N/A 11/10/2017   Procedure: LAPAROSCOPIC PARTIAL COLECTOMY;  Surgeon: Lucretia Roers, MD;  Location: [facility];  Service: General;  Laterality: N/A;   COLONOSCOPY N/A 10/17/2017   Procedure: COLONOSCOPY;  Surgeon: Corbin Ade, MD; bulky apple core tumor in the vicinity of a sending colon.  Diverticulosis in sigmoid and descending colon.  Pathology with invasive adenocarcinoma.   COLONOSCOPY N/A 07/28/2019   Procedure: COLONOSCOPY;  Surgeon: Corbin Ade, MD;  [facility];  Service: Endoscopy;  Laterality: N/A;  2:15pm   POLYPECTOMY  07/28/2019   Procedure: POLYPECTOMY;  Surgeon: Corbin Ade, MD;  [facility];  Service: Endoscopy;;    Home Medications:  Allergies as of 09/27/2022   No Known Allergies      Medication List        Accurate as of September 27, 2022 10:09 AM. If you have any questions, ask your nurse or doctor.          atorvastatin 20 MG tablet Commonly known as: LIPITOR Take 20 mg by mouth daily.   diphenhydrAMINE 25 MG tablet Commonly known as: BENADRYL Take 25 mg by mouth every 6 (six) hours as needed for allergies.   FISH OIL PO Take 1 capsule by mouth 4 (four)  times a week.   lisinopril 10 MG tablet Commonly known as: ZESTRIL Take 10 mg by mouth daily.        Allergies: No Known Allergies  Family History: Family History  Problem Relation Age of Onset   Stroke Mother    Alcohol abuse Father    Down syndrome Sister    Alcohol abuse Brother    Colon cancer Neg Hx    Ulcers Neg Hx     Social History:  reports that he has never smoked. He quit smokeless tobacco use about 43 years ago.  His smokeless tobacco use included chew. He reports that he does not drink alcohol and does not use drugs.  ROS: All other review of systems were reviewed and are negative except what is noted above in HPI  Physical Exam: BP (!) 145/70   Pulse (!) 55   Constitutional:  Alert and oriented, No acute distress. HEENT: [place] AT, moist mucus membranes.  Trachea midline, no masses. Cardiovascular: No clubbing, cyanosis, or edema. Respiratory: Normal respiratory effort, no increased work of breathing. GI: Abdomen is soft, nontender, nondistended, no abdominal masses GU: No CVA tenderness.  Lymph: No cervical or inguinal lymphadenopathy. Skin: No rashes, bruises or suspicious lesions. Neurologic: Grossly intact, no focal deficits, moving all 4 extremities. Psychiatric: Normal mood and affect.  Laboratory Data:  Lab Results  Component Value Date   WBC 6.6 09/16/2022   HGB 16.9 09/16/2022   HCT 49.5 09/16/2022   MCV 90.2 09/16/2022   PLT 189 09/16/2022    Lab Results  Component Value Date   CREATININE 0.91 09/16/2022    No results found for: "PSA"  No results found for: "TESTOSTERONE"  No results found for: "HGBA1C"  Urinalysis    Component Value Date/Time   APPEARANCEUR Clear 03/27/2022 1123   GLUCOSEU Negative 03/27/2022 1123   BILIRUBINUR Negative 03/27/2022 1123   PROTEINUR Negative 03/27/2022 1123   NITRITE Negative 03/27/2022 1123   LEUKOCYTESUR Negative 03/27/2022 1123    Lab Results  Component Value Date   LABMICR Comment  03/27/2022    Pertinent Imaging: CT 09/16/2022: images reviewed and discussed with the patient  No results found for this or any previous visit.  No results found for this or any previous visit.  No results found for this or any previous visit.  No results found for this or any previous visit.  Results for orders placed during the hospital encounter of 09/23/22  Ultrasound renal complete  Narrative CLINICAL DATA:  Renal mass  EXAM: RENAL / URINARY TRACT ULTRASOUND COMPLETE  COMPARISON:  CT abdomen pelvis 09/16/2022; renal ultrasound 09/19/2021  FINDINGS: Right Kidney:  Renal measurements: 11.1 x 5.3 x 4.8 cm = volume: 148.2 mL. Normal renal cortical thickness and echogenicity. No hydronephrosis. There is a 4.4 x 2.4 x 3.2 cm mixed echogenicity mass midpole right kidney, previously measuring 4.1 x 3.0 cm on prior CT.  Left Kidney:  Renal measurements: 11.8 x 5.8 x 5.3 cm = volume: 190.7 mL. Echogenicity within normal limits. No mass or hydronephrosis visualized.  Bladder:  Appears normal for degree of bladder distention.  Other:  None.  IMPRESSION: Mixed echogenicity mass midpole right kidney measures up to 4.4 cm, previously measuring up to 4.1 cm on CT from 09/16/2022. This remains concerning for renal cell carcinoma.   Electronically Signed By: Annia Belt M.D. On: 09/23/2022 14:28  No valid procedures specified. No results found for this or any previous visit.  No results found for this or any previous visit.   Assessment & Plan:    1. History of kidney cancer We discussed the natural hx of renal masses and the 80/20 malignant/benign likelihood. We disucssed the treatment options including active surveillance. Renal ablation, partial and radical nephrectomy. After discussing discussing the options the patient elects for ablation - Urinalysis, Routine w reflex microscopic   No follow-ups on file.  Wilkie Aye, MD  [facility]  [place]

## 2022-10-01 ENCOUNTER — Encounter: Payer: Self-pay | Admitting: Urology

## 2022-10-01 NOTE — Patient Instructions (Signed)

## 2022-10-04 ENCOUNTER — Encounter (HOSPITAL_COMMUNITY): Payer: Self-pay | Admitting: Hematology

## 2022-11-22 ENCOUNTER — Ambulatory Visit: Payer: Medicare HMO | Admitting: Urology

## 2022-11-22 VITALS — BP 161/79 | HR 60 | Ht 66.0 in | Wt 176.0 lb

## 2022-11-22 DIAGNOSIS — R351 Nocturia: Secondary | ICD-10-CM | POA: Diagnosis not present

## 2022-11-22 DIAGNOSIS — Z85528 Personal history of other malignant neoplasm of kidney: Secondary | ICD-10-CM

## 2022-11-22 LAB — URINALYSIS, ROUTINE W REFLEX MICROSCOPIC
Bilirubin, UA: NEGATIVE
Glucose, UA: NEGATIVE
Ketones, UA: NEGATIVE
Nitrite, UA: NEGATIVE
Protein,UA: NEGATIVE
RBC, UA: NEGATIVE
Specific Gravity, UA: <1.005 — ABNORMAL LOW (ref 1.005–1.030)
Urobilinogen, Ur: 0.2 mg/dL (ref 0.2–1.0)
pH, UA: 6 (ref 5.0–7.5)

## 2022-11-22 LAB — MICROSCOPIC EXAMINATION: Bacteria, UA: NONE SEEN

## 2022-11-22 NOTE — Progress Notes (Signed)
11/22/2022 11:29 AM   Lonia Chimera 06-10-1956 [id number]  Referring provider: No referring provider defined for this encounter.  Followup renal mass   HPI: Mr Force is a 67yo here for followup for a right renal mass. The mass has increase din size 2-68mm since last visit. He denies nay flank pain. IPSS 7 QOL 1 on no therapy. Nocturia 1-2. He has not seen interventional radiology for management of his renal mass.    PMH: Past Medical History:  Diagnosis Date   Anemia    Cancer (HCC)    Ascending colon s/p right hemicolectomy on 11/10/2017.    GERD (gastroesophageal reflux disease)    Hyperlipidemia    Lower back pain     Surgical History: Past Surgical History:  Procedure Laterality Date   BACK SURGERY     BIOPSY  10/17/2017   Procedure: BIOPSY;  Surgeon: Corbin Ade, MD;  [facility];  Service: Endoscopy;;   COLON RESECTION N/A 11/10/2017   Procedure: LAPAROSCOPIC PARTIAL COLECTOMY;  Surgeon: Lucretia Roers, MD;  Location: [facility];  Service: General;  Laterality: N/A;   COLONOSCOPY N/A 10/17/2017   Procedure: COLONOSCOPY;  Surgeon: Corbin Ade, MD; bulky apple core tumor in the vicinity of a sending colon.  Diverticulosis in sigmoid and descending colon.  Pathology with invasive adenocarcinoma.   COLONOSCOPY N/A 07/28/2019   Procedure: COLONOSCOPY;  Surgeon: Corbin Ade, MD;  [facility];  Service: Endoscopy;  Laterality: N/A;  2:15pm   POLYPECTOMY  07/28/2019   Procedure: POLYPECTOMY;  Surgeon: Corbin Ade, MD;  [facility];  Service: Endoscopy;;    Home Medications:  Allergies as of 11/22/2022   No Known Allergies      Medication List        Accurate as of November 22, 2022 11:29 AM. If you have any questions, ask your nurse or doctor.          atorvastatin 20 MG tablet Commonly known as: LIPITOR Take 20 mg by mouth daily.   diphenhydrAMINE 25 MG tablet Commonly known as: BENADRYL Take 25 mg by  mouth every 6 (six) hours as needed for allergies.   FISH OIL PO Take 1 capsule by mouth 4 (four) times a week.   lisinopril 10 MG tablet Commonly known as: ZESTRIL Take 10 mg by mouth daily.        Allergies: No Known Allergies  Family History: Family History  Problem Relation Age of Onset   Stroke Mother    Alcohol abuse Father    Down syndrome Sister    Alcohol abuse Brother    Colon cancer Neg Hx    Ulcers Neg Hx     Social History:  reports that he has never smoked. He quit smokeless tobacco use about 44 years ago.  His smokeless tobacco use included chew. He reports that he does not drink alcohol and does not use drugs.  ROS: All other review of systems were reviewed and are negative except what is noted above in HPI  Physical Exam: BP (!) 161/79   Pulse 60   Ht 5\' 6"  (1.676 m)   Wt 176 lb (79.8 kg)   BMI 28.41 kg/m   Constitutional:  Alert and oriented, No acute distress. HEENT: [place] AT, moist mucus membranes.  Trachea midline, no masses. Cardiovascular: No clubbing, cyanosis, or edema. Respiratory: Normal respiratory effort, no increased work of breathing. GI: Abdomen is soft, nontender, nondistended, no abdominal masses GU: No  CVA tenderness.  Lymph: No cervical or inguinal lymphadenopathy. Skin: No rashes, bruises or suspicious lesions. Neurologic: Grossly intact, no focal deficits, moving all 4 extremities. Psychiatric: Normal mood and affect.  Laboratory Data: Lab Results  Component Value Date   WBC 6.6 09/16/2022   HGB 16.9 09/16/2022   HCT 49.5 09/16/2022   MCV 90.2 09/16/2022   PLT 189 09/16/2022    Lab Results  Component Value Date   CREATININE 0.91 09/16/2022    No results found for: "PSA"  No results found for: "TESTOSTERONE"  No results found for: "HGBA1C"  Urinalysis    Component Value Date/Time   APPEARANCEUR Clear 09/27/2022 1000   GLUCOSEU Negative 09/27/2022 1000   BILIRUBINUR Negative 09/27/2022 1000   PROTEINUR  Negative 09/27/2022 1000   NITRITE Negative 09/27/2022 1000   LEUKOCYTESUR Trace (A) 09/27/2022 1000    Lab Results  Component Value Date   LABMICR See below: 09/27/2022   WBCUA 0-5 09/27/2022   LABEPIT 0-10 09/27/2022   MUCUS None seen 09/27/2022   BACTERIA None seen 09/27/2022    Pertinent Imaging:  No results found for this or any previous visit.  No results found for this or any previous visit.  No results found for this or any previous visit.  No results found for this or any previous visit.  Results for orders placed during the hospital encounter of 09/23/22  Ultrasound renal complete  Narrative CLINICAL DATA:  Renal mass  EXAM: RENAL / URINARY TRACT ULTRASOUND COMPLETE  COMPARISON:  CT abdomen pelvis 09/16/2022; renal ultrasound 09/19/2021  FINDINGS: Right Kidney:  Renal measurements: 11.1 x 5.3 x 4.8 cm = volume: 148.2 mL. Normal renal cortical thickness and echogenicity. No hydronephrosis. There is a 4.4 x 2.4 x 3.2 cm mixed echogenicity mass midpole right kidney, previously measuring 4.1 x 3.0 cm on prior CT.  Left Kidney:  Renal measurements: 11.8 x 5.8 x 5.3 cm = volume: 190.7 mL. Echogenicity within normal limits. No mass or hydronephrosis visualized.  Bladder:  Appears normal for degree of bladder distention.  Other:  None.  IMPRESSION: Mixed echogenicity mass midpole right kidney measures up to 4.4 cm, previously measuring up to 4.1 cm on CT from 09/16/2022. This remains concerning for renal cell carcinoma.   Electronically Signed By: Annia Belt M.D. On: 09/23/2022 14:28  No valid procedures specified. No results found for this or any previous visit.  No results found for this or any previous visit.   Assessment & Plan:    1. History of kidney cancer - - Urinalysis, Routine w reflex microscopic - Ambulatory referral to Interventional Radiology  2. Nocturia -patient defers therapy at this time   No follow-ups on  file.  Wilkie Aye, MD  [facility]

## 2022-11-29 ENCOUNTER — Encounter: Payer: Self-pay | Admitting: Urology

## 2022-11-29 NOTE — Patient Instructions (Signed)

## 2022-12-25 ENCOUNTER — Inpatient Hospital Stay: Payer: Medicare HMO | Attending: Hematology

## 2022-12-25 DIAGNOSIS — Z9049 Acquired absence of other specified parts of digestive tract: Secondary | ICD-10-CM | POA: Diagnosis not present

## 2022-12-25 DIAGNOSIS — D509 Iron deficiency anemia, unspecified: Secondary | ICD-10-CM | POA: Insufficient documentation

## 2022-12-25 DIAGNOSIS — C189 Malignant neoplasm of colon, unspecified: Secondary | ICD-10-CM | POA: Insufficient documentation

## 2022-12-25 DIAGNOSIS — C182 Malignant neoplasm of ascending colon: Secondary | ICD-10-CM

## 2022-12-25 DIAGNOSIS — Z87891 Personal history of nicotine dependence: Secondary | ICD-10-CM | POA: Diagnosis not present

## 2022-12-25 DIAGNOSIS — N2889 Other specified disorders of kidney and ureter: Secondary | ICD-10-CM | POA: Diagnosis not present

## 2022-12-25 LAB — CBC WITH DIFFERENTIAL/PLATELET
Abs Immature Granulocytes: 0.02 10*3/uL (ref 0.00–0.07)
Basophils Absolute: 0.1 10*3/uL (ref 0.0–0.1)
Basophils Relative: 1 %
Eosinophils Absolute: 0.3 10*3/uL (ref 0.0–0.5)
Eosinophils Relative: 4 %
HCT: 47.5 % (ref 39.0–52.0)
Hemoglobin: 16.7 g/dL (ref 13.0–17.0)
Immature Granulocytes: 0 %
Lymphocytes Relative: 44 %
Lymphs Abs: 3.4 10*3/uL (ref 0.7–4.0)
MCH: 31.2 pg (ref 26.0–34.0)
MCHC: 35.2 g/dL (ref 30.0–36.0)
MCV: 88.8 fL (ref 80.0–100.0)
Monocytes Absolute: 0.6 10*3/uL (ref 0.1–1.0)
Monocytes Relative: 8 %
Neutro Abs: 3.3 10*3/uL (ref 1.7–7.7)
Neutrophils Relative %: 43 %
Platelets: 196 10*3/uL (ref 150–400)
RBC: 5.35 MIL/uL (ref 4.22–5.81)
RDW: 12.2 % (ref 11.5–15.5)
WBC: 7.8 10*3/uL (ref 4.0–10.5)
nRBC: 0 % (ref 0.0–0.2)

## 2022-12-25 LAB — COMPREHENSIVE METABOLIC PANEL
ALT: 16 U/L (ref 0–44)
AST: 18 U/L (ref 15–41)
Albumin: 4.3 g/dL (ref 3.5–5.0)
Alkaline Phosphatase: 78 U/L (ref 38–126)
Anion gap: 6 (ref 5–15)
BUN: 9 mg/dL (ref 8–23)
CO2: 24 mmol/L (ref 22–32)
Calcium: 9 mg/dL (ref 8.9–10.3)
Chloride: 103 mmol/L (ref 98–111)
Creatinine, Ser: 0.94 mg/dL (ref 0.61–1.24)
GFR, Estimated: >60 mL/min (ref >60)
Glucose, Bld: 103 mg/dL — ABNORMAL HIGH (ref 70–99)
Potassium: 4.4 mmol/L (ref 3.5–5.1)
Sodium: 133 mmol/L — ABNORMAL LOW (ref 135–145)
Total Bilirubin: 1 mg/dL (ref 0.3–1.2)
Total Protein: 6.9 g/dL (ref 6.5–8.1)

## 2022-12-26 LAB — CEA: CEA: 1.1 ng/mL (ref 0.0–4.7)

## 2022-12-31 NOTE — Progress Notes (Signed)
[facility] [address] [place] [address]    Clinic Day:  12/31/2022  Referring physician: Pearson Grippe, MD  Patient Care Team: Pcp, No as PCP - General Corbin Ade, MD as Consulting Physician (Gastroenterology)   ASSESSMENT & PLAN:   Assessment: 1.  Stage II (T3N0) colon cancer: -Right hemicolectomy on 11/10/2017, MSI stable. -Colonoscopy on 07/28/2019 showed 5 mm polyp in the sigmoid colon consistent with tubular adenoma.  Exam otherwise normal.   2.  Social/family history: - He is a non-smoker but chewed tobacco and quit at age 40.  No family history of kidney cancer.   Plan: 1.  Stage II colon cancer: - Denies any change in bowel habits.  No bleeding per rectum. - Reviewed labs from 09/16/2022 with normal LFTs.  CBC was normal.  CEA is 1.2. - CTAP on 09/16/2022: No evidence of recurrence of colon cancer. - RTC 6 months for follow-up with CEA level and CT scan.   2.  Right kidney mass: - CTAP on 09/16/2022: Slight increase in size of right renal mass 4.1 x 3 cm prior 3.4 x 3 cm. - He will follow-up with Dr. Ronne Binning. - If he has nephrectomy, he will follow-up with me a month after surgery to see if he is a candidate for adjuvant pembrolizumab.  No orders of the defined types were placed in this encounter.   Alben Deeds Teague,acting as a Neurosurgeon for Doreatha Massed, MD.,have documented all relevant documentation on the behalf of Doreatha Massed, MD,as directed by  Doreatha Massed, MD while in the presence of Doreatha Massed, MD.  ***   [place] R Teague   7/23/20245:44 PM  CHIEF COMPLAINT:   Diagnosis: stage II colon cancer and IDA    Cancer Staging  Colon cancer [facility]) Staging form: Colon and Rectum, AJCC 8th Edition - Clinical: Stage IIA (cT3, cN0, cM0) - Signed by Doreatha Massed, MD on 11/24/2017    Prior Therapy: 1. Right hemicolectomy on 11/10/2017. 2. Intermittent Feraheme last on 12/08/2017.  Current Therapy:   surveillance    HISTORY OF PRESENT ILLNESS:   Oncology History  Colon cancer (HCC)  10/28/2017 Initial Diagnosis   Colon cancer (HCC)   11/24/2017 Cancer Staging   Staging form: Colon and Rectum, AJCC 8th Edition - Clinical: Stage IIA (cT3, cN0, cM0) - Signed by Doreatha Massed, MD on 11/24/2017      INTERVAL HISTORY:   Jack Gibbs is a 67 y.o. male presenting to clinic today for follow up of stage II colon cancer and IDA. He was last seen by me on 09/23/22.  Today, he states that he is doing well overall. His appetite level is at ***%. His energy level is at ***%.  PAST MEDICAL HISTORY:   Past Medical History: Past Medical History:  Diagnosis Date   Anemia    Cancer (HCC)    Ascending colon s/p right hemicolectomy on 11/10/2017.    GERD (gastroesophageal reflux disease)    Hyperlipidemia    Lower back pain     Surgical History: Past Surgical History:  Procedure Laterality Date   BACK SURGERY     BIOPSY  10/17/2017   Procedure: BIOPSY;  Surgeon: Corbin Ade, MD;  [facility];  Service: Endoscopy;;   COLON RESECTION N/A 11/10/2017   Procedure: LAPAROSCOPIC PARTIAL COLECTOMY;  Surgeon: Lucretia Roers, MD;  Location: [facility];  Service: General;  Laterality: N/A;   COLONOSCOPY N/A 10/17/2017   Procedure: COLONOSCOPY;  Surgeon: Eula Listen  M, MD; bulky apple core tumor in the vicinity of a sending colon.  Diverticulosis in sigmoid and descending colon.  Pathology with invasive adenocarcinoma.   COLONOSCOPY N/A 07/28/2019   Procedure: COLONOSCOPY;  Surgeon: Corbin Ade, MD;  [facility];  Service: Endoscopy;  Laterality: N/A;  2:15pm   POLYPECTOMY  07/28/2019   Procedure: POLYPECTOMY;  Surgeon: Corbin Ade, MD;  [facility];  Service: Endoscopy;;    Social History: Social History   Socioeconomic History   Marital status: Single    Spouse name: Not on file   Number of children: Not on file   Years of education: Not on file    Highest education level: Not on file  Occupational History   Not on file  Tobacco Use   Smoking status: Never   Smokeless tobacco: Former    Types: Chew    Quit date: 11/25/1978  Vaping Use   Vaping status: Never Used  Substance and Sexual Activity   Alcohol use: No   Drug use: No   Sexual activity: Not on file  Other Topics Concern   Not on file  Social History Narrative   Not on file   Social Determinants of Health   Financial Resource Strain: Not on file  Food Insecurity: Not on file  Transportation Needs: Not on file  Physical Activity: Not on file  Stress: Not on file  Social Connections: Not on file  Intimate Partner Violence: Not on file    Family History: Family History  Problem Relation Age of Onset   Stroke Mother    Alcohol abuse Father    Down syndrome Sister    Alcohol abuse Brother    Colon cancer Neg Hx    Ulcers Neg Hx     Current Medications:  Current Outpatient Medications:    atorvastatin (LIPITOR) 20 MG tablet, Take 20 mg by mouth daily., Disp: , Rfl:    diphenhydrAMINE (BENADRYL) 25 MG tablet, Take 25 mg by mouth every 6 (six) hours as needed for allergies., Disp: , Rfl:    lisinopril (ZESTRIL) 10 MG tablet, Take 10 mg by mouth daily., Disp: , Rfl:    Omega-3 Fatty Acids (FISH OIL PO), Take 1 capsule by mouth 4 (four) times a week. , Disp: , Rfl:    Allergies: No Known Allergies  REVIEW OF SYSTEMS:   Review of Systems  Constitutional:  Negative for chills, fatigue and fever.  HENT:   Negative for lump/mass, mouth sores, nosebleeds, sore throat and trouble swallowing.   Eyes:  Negative for eye problems.  Respiratory:  Negative for cough and shortness of breath.   Cardiovascular:  Negative for chest pain, leg swelling and palpitations.  Gastrointestinal:  Negative for abdominal pain, constipation, diarrhea, nausea and vomiting.  Genitourinary:  Negative for bladder incontinence, difficulty urinating, dysuria, frequency, hematuria and  nocturia.   Musculoskeletal:  Negative for arthralgias, back pain, flank pain, myalgias and neck pain.  Skin:  Negative for itching and rash.  Neurological:  Negative for dizziness, headaches and numbness.  Hematological:  Does not bruise/bleed easily.  Psychiatric/Behavioral:  Negative for depression, sleep disturbance and suicidal ideas. The patient is not nervous/anxious.   All other systems reviewed and are negative.    VITALS:   There were no vitals taken for this visit.  Wt Readings from Last 3 Encounters:  11/22/22 176 lb (79.8 kg)  09/23/22 176 lb 12.8 oz (80.2 kg)  09/19/21 180 lb 3 oz (81.7 kg)  There is no height or weight on file to calculate BMI.  Performance status (ECOG): 1 - Symptomatic but completely ambulatory  PHYSICAL EXAM:   Physical Exam Vitals and nursing note reviewed. Exam conducted with a chaperone present.  Constitutional:      Appearance: Normal appearance.  Cardiovascular:     Rate and Rhythm: Normal rate and regular rhythm.     Pulses: Normal pulses.     Heart sounds: Normal heart sounds.  Pulmonary:     Effort: Pulmonary effort is normal.     Breath sounds: Normal breath sounds.  Abdominal:     Palpations: Abdomen is soft. There is no hepatomegaly, splenomegaly or mass.     Tenderness: There is no abdominal tenderness.  Musculoskeletal:     Right lower leg: No edema.     Left lower leg: No edema.  Lymphadenopathy:     Cervical: No cervical adenopathy.     Right cervical: No superficial, deep or posterior cervical adenopathy.    Left cervical: No superficial, deep or posterior cervical adenopathy.     Upper Body:     Right upper body: No supraclavicular or axillary adenopathy.     Left upper body: No supraclavicular or axillary adenopathy.  Neurological:     General: No focal deficit present.     Mental Status: He is alert and oriented to person, place, and time.  Psychiatric:        Mood and Affect: Mood normal.        Behavior:  Behavior normal.     LABS:      Latest Ref Rng & Units 12/25/2022    9:51 AM 09/16/2022   10:20 AM 03/13/2022   10:03 AM  CBC  WBC 4.0 - 10.5 K/uL 7.8  6.6  6.6   Hemoglobin 13.0 - 17.0 g/dL 16.1  09.6  04.5   Hematocrit 39.0 - 52.0 % 47.5  49.5  48.2   Platelets 150 - 400 K/uL 196  189  212       Latest Ref Rng & Units 12/25/2022    9:51 AM 09/16/2022   10:20 AM 03/13/2022   10:03 AM  CMP  Glucose 70 - 99 mg/dL 409  811  98   BUN 8 - 23 mg/dL 9  10  8    Creatinine 0.61 - 1.24 mg/dL 9.14  7.82  9.56   Sodium 135 - 145 mmol/L 133  133  133   Potassium 3.5 - 5.1 mmol/L 4.4  4.8  4.3   Chloride 98 - 111 mmol/L 103  101  99   CO2 22 - 32 mmol/L 24  25  25    Calcium 8.9 - 10.3 mg/dL 9.0  9.2  9.3   Total Protein 6.5 - 8.1 g/dL 6.9  7.6  7.7   Total Bilirubin 0.3 - 1.2 mg/dL 1.0  1.1  1.4   Alkaline Phos 38 - 126 U/L 78  84  82   AST 15 - 41 U/L 18  20  32   ALT 0 - 44 U/L 16  20  33      Lab Results  Component Value Date   CEA1 1.1 12/25/2022   /  CEA  Date Value Ref Range Status  12/25/2022 1.1 0.0 - 4.7 ng/mL Final    Comment:    (NOTE)  Nonsmokers          <3.9                             Smokers             <5.6 Roche [employer] Electrochemiluminescence Immunoassay (ECLIA) Values obtained with different assay methods or kits cannot be used interchangeably.  Results cannot be interpreted as absolute evidence of the presence or absence of malignant disease. Performed At: [facility] [address] [place], [place] [id number] Jolene Schimke MD YQ:[id number]    No results found for: "PSA1" No results found for: "CAN199" No results found for: "CAN125"  No results found for: "TOTALPROTELP", "ALBUMINELP", "A1GS", "A2GS", "BETS", "BETA2SER", "GAMS", "MSPIKE", "SPEI" Lab Results  Component Value Date   TIBC 493 (H) 11/24/2017   FERRITIN 113 01/27/2019   FERRITIN 92 10/27/2018   FERRITIN 56 04/27/2018   IRONPCTSAT 3 (L)  11/24/2017   Lab Results  Component Value Date   LDH 142 01/27/2019   LDH 155 10/27/2018   LDH 149 04/27/2018     STUDIES:   No results found.

## 2023-01-01 ENCOUNTER — Encounter: Payer: Self-pay | Admitting: Hematology

## 2023-01-01 ENCOUNTER — Other Ambulatory Visit: Payer: Self-pay | Admitting: Urology

## 2023-01-01 ENCOUNTER — Telehealth: Payer: Self-pay

## 2023-01-01 ENCOUNTER — Inpatient Hospital Stay: Payer: Medicare HMO | Admitting: Hematology

## 2023-01-01 VITALS — BP 135/76

## 2023-01-01 DIAGNOSIS — N2889 Other specified disorders of kidney and ureter: Secondary | ICD-10-CM

## 2023-01-01 DIAGNOSIS — C189 Malignant neoplasm of colon, unspecified: Secondary | ICD-10-CM | POA: Diagnosis not present

## 2023-01-01 DIAGNOSIS — C182 Malignant neoplasm of ascending colon: Secondary | ICD-10-CM | POA: Diagnosis not present

## 2023-01-01 NOTE — Telephone Encounter (Signed)
Patient came by the office advising he needed to be scheduled for surgery. IR will be contacting the patient to schedule.

## 2023-01-01 NOTE — Patient Instructions (Addendum)
[facility] at [facility] Discharge Instructions   You were seen and examined today by Dr. Ellin Saba.  He reviewed the results of your lab work which are normal/stable.   He reviewed the results of your CT scan. The mass on the kidney has gotten bigger. There is no other evidence of cancer in the body.   We will see you back in 6 months. We will repeat lab work prior to this visit.   Thank you for choosing [facility] at [facility] to provide your oncology and hematology care.  To afford each patient quality time with our provider, please arrive at least 15 minutes before your scheduled appointment time.   If you have a lab appointment with the Cancer Center please come in thru the Main Entrance and check in at the main information desk.  You need to re-schedule your appointment should you arrive 10 or more minutes late.  We strive to give you quality time with our providers, and arriving late affects you and other patients whose appointments are after yours.  Also, if you no show three or more times for appointments you may be dismissed from the clinic at the providers discretion.     Again, thank you for choosing [facility].  Our hope is that these requests will decrease the amount of time that you wait before being seen by our physicians.       _____________________________________________________________  Should you have questions after your visit to [facility], please contact our office at [phone] and follow the prompts.  Our office hours are 8:00 a.m. and 4:30 p.m. Monday - Friday.  Please note that voicemails left after 4:00 p.m. may not be returned until the following business day.  We are closed weekends and major holidays.  You do have access to a nurse 24-7, just call the main number to the clinic [phone] and do not press any options, hold on the line and a nurse will answer the phone.    For  prescription refill requests, have your pharmacy contact our office and allow 72 hours.    Due to Covid, you will need to wear a mask upon entering the hospital. If you do not have a mask, a mask will be given to you at the Main Entrance upon arrival. For doctor visits, patients may have 1 support person age 72 or older with them. For treatment visits, patients can not have anyone with them due to social distancing guidelines and our immunocompromised population.

## 2023-01-08 ENCOUNTER — Ambulatory Visit: Admission: RE | Admit: 2023-01-08 | Payer: Medicare HMO | Source: Ambulatory Visit

## 2023-01-08 ENCOUNTER — Encounter: Payer: Self-pay | Admitting: Urology

## 2023-01-08 DIAGNOSIS — N2889 Other specified disorders of kidney and ureter: Secondary | ICD-10-CM

## 2023-01-08 HISTORY — PX: IR RADIOLOGIST EVAL & MGMT: IMG5224

## 2023-01-08 NOTE — Consult Note (Signed)
Chief Complaint:  4 cm right renal mass  Referring Physician(s): McKenzie,Patrick L  History of Present Illness: Jack Gibbs is a 67 y.o. male with a prior history of stage II colon cancer status post right hemicolectomy in 2019.  From a colon cancer standpoint he is being closely followed by oncology at [facility] and getting surveillance imaging.  Surveillance imaging also demonstrates slow gradual increase in size of a heterogeneously enhancing solid and vascular right renal mass in the midpole medially now measuring over 4 cm.  Lesion is consistent with renal cell carcinoma by imaging.  He presents today to discuss cryoablation.  Overall he is doing very well clinically and has an excellent functional status.  He is asymptomatic.  No flank or abdominal pain.  No urinary tract symptoms or hematuria.  Past Medical History:  Diagnosis Date   Anemia    Cancer (HCC)    Ascending colon s/p right hemicolectomy on 11/10/2017.    GERD (gastroesophageal reflux disease)    Hyperlipidemia    Lower back pain     Past Surgical History:  Procedure Laterality Date   BACK SURGERY     BIOPSY  10/17/2017   Procedure: BIOPSY;  Surgeon: Corbin Ade, MD;  Location: [facility];  Service: Endoscopy;;   COLON RESECTION N/A 11/10/2017   Procedure: LAPAROSCOPIC PARTIAL COLECTOMY;  Surgeon: Lucretia Roers, MD;  Location: [facility];  Service: General;  Laterality: N/A;   COLONOSCOPY N/A 10/17/2017   Procedure: COLONOSCOPY;  Surgeon: Corbin Ade, MD; bulky apple core tumor in the vicinity of a sending colon.  Diverticulosis in sigmoid and descending colon.  Pathology with invasive adenocarcinoma.   COLONOSCOPY N/A 07/28/2019   Procedure: COLONOSCOPY;  Surgeon: Corbin Ade, MD;  Location[facility];  Service: Endoscopy;  Laterality: N/A;  2:15pm   POLYPECTOMY  07/28/2019   Procedure: POLYPECTOMY;  Surgeon: Corbin Ade, MD;  Location: [facility];  Service:  Endoscopy;;    Allergies: Patient has no known allergies.  Medications: Prior to Admission medications   Medication Sig Start Date End Date Taking? Authorizing Provider  atorvastatin (LIPITOR) 20 MG tablet Take 20 mg by mouth daily. 01/15/22   [provider]  diphenhydrAMINE (BENADRYL) 25 MG tablet Take 25 mg by mouth every 6 (six) hours as needed for allergies.    [provider]  lisinopril (ZESTRIL) 10 MG tablet Take 10 mg by mouth daily. 09/17/21   [provider]     Family History  Problem Relation Age of Onset   Stroke Mother    Alcohol abuse Father    Down syndrome Sister    Alcohol abuse Brother    Colon cancer Neg Hx    Ulcers Neg Hx     Social History   Socioeconomic History   Marital status: Single    Spouse name: Not on file   Number of children: Not on file   Years of education: Not on file   Highest education level: Not on file  Occupational History   Not on file  Tobacco Use   Smoking status: Never   Smokeless tobacco: Former    Types: Chew    Quit date: 11/25/1978  Vaping Use   Vaping status: Never Used  Substance and Sexual Activity   Alcohol use: No   Drug use: No   Sexual activity: Not on file  Other Topics Concern   Not on file  Social  History Narrative   Not on file   Social Determinants of Health   Financial Resource Strain: Not on file  Food Insecurity: Not on file  Transportation Needs: Not on file  Physical Activity: Not on file  Stress: Not on file  Social Connections: Not on file    ECOG Status: 0 - Asymptomatic  Review of Systems: A 12 point ROS discussed and pertinent positives are indicated in the HPI above.  All other systems are negative.  Review of Systems  Vital Signs: BP (!) 159/81   Pulse (!) 54   Temp 97.7 F (36.5 C)   Resp 16   SpO2 96%      Physical Exam Constitutional:      General: He is not in acute distress.    Appearance: He is not ill-appearing.  Eyes:     General:  No scleral icterus.    Conjunctiva/sclera: Conjunctivae normal.  Cardiovascular:     Rate and Rhythm: Normal rate and regular rhythm.     Heart sounds: No murmur heard. Pulmonary:     Effort: Pulmonary effort is normal.     Breath sounds: Normal breath sounds.  Abdominal:     General: Bowel sounds are normal. There is no distension.     Palpations: Abdomen is soft.     Tenderness: There is no abdominal tenderness.  Musculoskeletal:        General: No swelling.  Skin:    Coloration: Skin is not jaundiced.  Neurological:     General: No focal deficit present.     Mental Status: He is alert. Mental status is at baseline.  Psychiatric:        Mood and Affect: Mood normal.        Thought Content: Thought content normal.      Imaging: No results found.  Labs:  CBC: Recent Labs    03/13/22 1003 09/16/22 1020 12/25/22 0951  WBC 6.6 6.6 7.8  HGB 16.9 16.9 16.7  HCT 48.2 49.5 47.5  PLT 212 189 196    COAGS: No results for input(s): "INR", "APTT" in the last 8760 hours.  BMP: Recent Labs    03/13/22 1003 09/16/22 1020 12/25/22 0951  NA 133* 133* 133*  K 4.3 4.8 4.4  CL 99 101 103  CO2 25 25 24   GLUCOSE 98 104* 103*  BUN 8 10 9   CALCIUM 9.3 9.2 9.0  CREATININE 0.88 0.91 0.94  GFRNONAA >60 >60 >60    LIVER FUNCTION TESTS: Recent Labs    03/13/22 1003 09/16/22 1020 12/25/22 0951  BILITOT 1.4* 1.1 1.0  AST 32 20 18  ALT 33 20 16  ALKPHOS 82 84 78  PROT 7.7 7.6 6.9  ALBUMIN 4.9 4.8 4.3     Assessment and Plan:  Slowly growing now 4.1 cm solid enhancing right renal midpole mass consistent with renal cell carcinoma by imaging.  At this size and appearance, there is a higher chance of incomplete ablation.  The lesion is also extremely vascular which increases the chances of bleeding complication.  Overall, patient appears to be a surgical candidate with normal renal function.  For these reasons, I would recommend surgery over cryoablation for treating  this lesion, for his best outcome at a curative procedure.  This was discussed in detail with the patient and his son today.  Imaging was reviewed directly on the monitor.  After our discussion they have a clear understanding.  All questions addressed.  He plans to follow-up with Dr.  McKenzie in urology as soon as possible to discuss surgical options.  Thank you for this interesting consult.  I greatly enjoyed meeting TARENCE STUEDEMANN and look forward to participating in their care.  A copy of this report was sent to the requesting provider on this date.  Electronically Signed: Berdine Dance 01/08/2023, 9:35 AM   I spent a total of  40 Minutes   in face to face in clinical consultation, greater than 50% of which was counseling/coordinating care for This patient with a right renal cell carcinoma

## 2023-01-13 ENCOUNTER — Telehealth: Payer: Self-pay

## 2023-01-13 NOTE — Telephone Encounter (Signed)
Patient's daughter - Joice Lofts is calling to find out what patient will need to do since  [facility] -Dr. Miles Costain is sending pt back to doctor McKenzie.  Please advise.  Call Amber at [phone]

## 2023-01-13 NOTE — Telephone Encounter (Signed)
Daughter in law states patient is not a candidate to have cryoablation done due tumor size and has been referred back to Dr. Ronne Binning to possibly have kidney removed. Patient has an appointment on 9/4. Daughter in law want to know if he is ok to wait that long or should he come in sooner.  Daughter in law made aware that MD in on vacation this week.

## 2023-02-12 ENCOUNTER — Ambulatory Visit: Payer: Medicare HMO | Admitting: Urology

## 2023-02-12 VITALS — BP 169/78 | HR 54

## 2023-02-12 DIAGNOSIS — N2889 Other specified disorders of kidney and ureter: Secondary | ICD-10-CM

## 2023-02-12 DIAGNOSIS — Z85528 Personal history of other malignant neoplasm of kidney: Secondary | ICD-10-CM | POA: Diagnosis not present

## 2023-02-12 LAB — URINALYSIS, ROUTINE W REFLEX MICROSCOPIC
Bilirubin, UA: NEGATIVE
Glucose, UA: NEGATIVE
Ketones, UA: NEGATIVE
Leukocytes,UA: NEGATIVE
Nitrite, UA: NEGATIVE
Protein,UA: NEGATIVE
RBC, UA: NEGATIVE
Specific Gravity, UA: 1.01 (ref 1.005–1.030)
Urobilinogen, Ur: 0.2 mg/dL (ref 0.2–1.0)
pH, UA: 6 (ref 5.0–7.5)

## 2023-02-12 NOTE — Patient Instructions (Signed)
Low-Purine Eating Plan A low-purine eating plan involves making food choices to limit your purine intake. Purine is a kind of uric acid. Too much uric acid in your blood can cause certain conditions, such as gout and kidney stones. Eating a low-purine diet may help control these conditions. What are tips for following this plan? Shopping Avoid buying products that contain high-fructose corn syrup. Check for this on food labels. It is commonly found in many processed foods and soft drinks. Be sure to check for it in baked goods such as cookies, canned fruits, and cereals and cereal bars. Avoid buying veal, chicken breast with skin, lamb, and organ meats such as liver. These types of meats tend to have the highest purine content. Choose dairy products. These may lower uric acid levels. Avoid certain types of fish. Not all fish and seafood have high purine content. Examples with high purine content include anchovies, trout, tuna, sardines, and salmon. Avoid buying beverages that contain alcohol, particularly beer and hard liquor. Alcohol can affect the way your body gets rid of uric acid. Meal planning  Learn which foods do or do not affect you. If you find out that a food tends to cause your gout symptoms to flare up, avoid eating that food. You can enjoy foods that do not cause problems. If you have any questions about a food item, talk with your dietitian or health care provider. Reduce the overall amount of meat in your diet. When you do eat meat, choose ones with lower purine content. Include plenty of fruits and vegetables. Although some vegetables may have a high purine content--such as asparagus, mushrooms, spinach, or cauliflower--it has been shown that these do not contribute to uric acid blood levels as much. Consume at least 1 dairy serving a day. This has been shown to decrease uric acid levels. General information If you drink alcohol: Limit how much you have to: 0-1 drink a day for  women who are not pregnant. 0-2 drinks a day for men. Know how much alcohol is in a drink. In the [place] one drink equals one 12 oz bottle of beer (355 mL), one 5 oz glass of wine (148 mL), or one 1 oz glass of hard liquor (44 mL). Drink plenty of water. Try to drink enough to keep your urine pale yellow. Fluids can help remove uric acid from your body. Work with your health care provider and dietitian to develop a plan to achieve or maintain a healthy weight. Losing weight may help reduce uric acid in your blood. What foods are recommended? The following are some types of foods that are good choices when limiting purine intake: Fresh or frozen fruits and vegetables. Whole grains, breads, cereals, and pasta. Rice. Beans, peas, legumes. Nuts and seeds. Dairy products. Fats and oils. The items listed above may not be a complete list. Talk with a dietitian about what dietary choices are best for you. What foods are not recommended? Limit your intake of foods high in purines, including: Beer and other alcohol. Meat-based gravy or sauce. Canned or fresh fish, such as: Anchovies, sardines, herring, salmon, and tuna. Mussels and scallops. Codfish, trout, and haddock. Bacon, veal, chicken breast with skin, and lamb. Organ meats, such as: Liver or kidney. Tripe. Sweetbreads (thymus gland or pancreas). Wild Education officer, environmental. Yeast or yeast extract supplements. Drinks sweetened with high-fructose corn syrup, such as soda. Processed foods made with high-fructose corn syrup. The items listed above may not be a complete list of foods  and beverages you should limit. Contact a dietitian for more information. Summary Eating a low-purine diet may help control conditions caused by too much uric acid in the body, such as gout or kidney stones. Choose low-purine foods, limit alcohol, and limit high-fructose corn syrup. You will learn over time which foods do or do not affect you. If you find out that a  food tends to cause your gout symptoms to flare up, avoid eating that food. This information is not intended to replace advice given to you by your health care provider. Make sure you discuss any questions you have with your health care provider. Document Revised: 05/10/2021 Document Reviewed: 05/10/2021 Elsevier Patient Education  2024 [employer].

## 2023-02-12 NOTE — Progress Notes (Signed)
02/12/2023 9:18 AM   Jack Gibbs 03/28/1956 [id number]  Referring provider: No referring provider defined for this encounter.  Renal mass   HPI: [name] is a 67yo here for followup for a right renal mass. His Mas has increased to 4cm from 3.4cm. He met with Interventional Radiology and he mass was not amenable to ablation.    PMH: Past Medical History:  Diagnosis Date   Anemia    Cancer (HCC)    Ascending colon s/p right hemicolectomy on 11/10/2017.    GERD (gastroesophageal reflux disease)    Hyperlipidemia    Lower back pain     Surgical History: Past Surgical History:  Procedure Laterality Date   BACK SURGERY     BIOPSY  10/17/2017   Procedure: BIOPSY;  Surgeon: Corbin Ade, MD;  [facility];  Service: Endoscopy;;   COLON RESECTION N/A 11/10/2017   Procedure: LAPAROSCOPIC PARTIAL COLECTOMY;  Surgeon: Lucretia Roers, MD;  Location: [facility];  Service: General;  Laterality: N/A;   COLONOSCOPY N/A 10/17/2017   Procedure: COLONOSCOPY;  Surgeon: Corbin Ade, MD; bulky apple core tumor in the vicinity of a sending colon.  Diverticulosis in sigmoid and descending colon.  Pathology with invasive adenocarcinoma.   COLONOSCOPY N/A 07/28/2019   Procedure: COLONOSCOPY;  Surgeon: Corbin Ade, MD;  [facility]: [facility];  Service: Endoscopy;  Laterality: N/A;  2:15pm   IR RADIOLOGIST EVAL & MGMT  01/08/2023   POLYPECTOMY  07/28/2019   Procedure: POLYPECTOMY;  Surgeon: Corbin Ade, MD;  [facility];  Service: Endoscopy;;    Home Medications:  Allergies as of 02/12/2023   No Known Allergies      Medication List        Accurate as of February 12, 2023  9:18 AM. If you have any questions, ask your nurse or doctor.          atorvastatin 20 MG tablet Commonly known as: LIPITOR Take 20 mg by mouth daily.   diphenhydrAMINE 25 MG tablet Commonly known as: BENADRYL Take 25 mg by mouth every 6 (six) hours as needed for  allergies.   lisinopril 10 MG tablet Commonly known as: ZESTRIL Take 10 mg by mouth daily.        Allergies: No Known Allergies  Family History: Family History  Problem Relation Age of Onset   Stroke Mother    Alcohol abuse Father    Down syndrome Sister    Alcohol abuse Brother    Colon cancer Neg Hx    Ulcers Neg Hx     Social History:  reports that he has never smoked. He quit smokeless tobacco use about 44 years ago.  His smokeless tobacco use included chew. He reports that he does not drink alcohol and does not use drugs.  ROS: All other review of systems were reviewed and are negative except what is noted above in HPI  Physical Exam: BP (!) 169/78   Pulse (!) 54   Constitutional:  Alert and oriented, No acute distress. HEENT: [place] AT, moist mucus membranes.  Trachea midline, no masses. Cardiovascular: No clubbing, cyanosis, or edema. Respiratory: Normal respiratory effort, no increased work of breathing. GI: Abdomen is soft, nontender, nondistended, no abdominal masses GU: No CVA tenderness.  Lymph: No cervical or inguinal lymphadenopathy. Skin: No rashes, bruises or suspicious lesions. Neurologic: Grossly intact, no focal deficits, moving all 4 extremities. Psychiatric: Normal mood and affect.  Laboratory Data: Lab Results  Component Value  Date   WBC 7.8 12/25/2022   HGB 16.7 12/25/2022   HCT 47.5 12/25/2022   MCV 88.8 12/25/2022   PLT 196 12/25/2022    Lab Results  Component Value Date   CREATININE 0.94 12/25/2022    No results found for: "PSA"  No results found for: "TESTOSTERONE"  No results found for: "HGBA1C"  Urinalysis    Component Value Date/Time   APPEARANCEUR Clear 11/22/2022 1056   GLUCOSEU Negative 11/22/2022 1056   BILIRUBINUR Negative 11/22/2022 1056   PROTEINUR Negative 11/22/2022 1056   NITRITE Negative 11/22/2022 1056   LEUKOCYTESUR Trace (A) 11/22/2022 1056    Lab Results  Component Value Date   LABMICR See below:  11/22/2022   WBCUA 0-5 11/22/2022   LABEPIT 0-10 11/22/2022   MUCUS None seen 09/27/2022   BACTERIA None seen 11/22/2022    Pertinent Imaging: CT 09/16/2022: Images reviewed and discussed with the patient  No results found for this or any previous visit.  No results found for this or any previous visit.  No results found for this or any previous visit.  No results found for this or any previous visit.  Results for orders placed during the hospital encounter of 09/23/22  Ultrasound renal complete  Narrative CLINICAL DATA:  Renal mass  EXAM: RENAL / URINARY TRACT ULTRASOUND COMPLETE  COMPARISON:  CT abdomen pelvis 09/16/2022; renal ultrasound 09/19/2021  FINDINGS: Right Kidney:  Renal measurements: 11.1 x 5.3 x 4.8 cm = volume: 148.2 mL. Normal renal cortical thickness and echogenicity. No hydronephrosis. There is a 4.4 x 2.4 x 3.2 cm mixed echogenicity mass midpole right kidney, previously measuring 4.1 x 3.0 cm on prior CT.  Left Kidney:  Renal measurements: 11.8 x 5.8 x 5.3 cm = volume: 190.7 mL. Echogenicity within normal limits. No mass or hydronephrosis visualized.  Bladder:  Appears normal for degree of bladder distention.  Other:  None.  IMPRESSION: Mixed echogenicity mass midpole right kidney measures up to 4.4 cm, previously measuring up to 4.1 cm on CT from 09/16/2022. This remains concerning for renal cell carcinoma.   Electronically Signed By: Annia Belt M.D. On: 09/23/2022 14:28  No valid procedures specified. No results found for this or any previous visit.  No results found for this or any previous visit.   Assessment & Plan:    1. History of kidney cancer We discussed the natural hx of renal masses and the 80/20 malignant/benign likelihood. We disucssed the treatment options including active surveillance. Renal ablation, partial and radical nephrectomy. After discussing options the patient elects for right radical nephrectomny -  Urinalysis, Routine w reflex microscopic - Ambulatory Referral For Surgery Scheduling   No follow-ups on file.  Wilkie Aye, MD  [facility] [place]

## 2023-02-18 ENCOUNTER — Encounter: Payer: Self-pay | Admitting: Urology

## 2023-02-18 ENCOUNTER — Telehealth: Payer: Self-pay | Admitting: Urology

## 2023-02-18 NOTE — Telephone Encounter (Signed)
Patient daughter in law call to inquire about getting him scheduled for surgery

## 2023-02-21 NOTE — Telephone Encounter (Signed)
Scheduled with daughter update in workque.

## 2023-02-21 NOTE — Telephone Encounter (Signed)
Called again she is trying to get his surgery scheduled.

## 2023-02-24 ENCOUNTER — Other Ambulatory Visit: Payer: Self-pay

## 2023-02-24 MED ORDER — MAGNESIUM CITRATE PO SOLN: 1.0000 | Freq: Once | ORAL | 0 refills | Status: AC

## 2023-04-09 ENCOUNTER — Encounter (HOSPITAL_COMMUNITY): Payer: Self-pay

## 2023-04-09 NOTE — Patient Instructions (Signed)
Jack Gibbs  04/09/2023     @PREFPERIOPPHARMACY @   Your procedure is scheduled on 04/14/2023.  Report to [facility] at 6:30 A.M.  Call this number if you have problems the morning of surgery:  [phone]  If you experience any cold or flu symptoms such as cough, fever, chills, shortness of breath, etc. between now and your scheduled surgery, please notify us at the above number.   Remember:   Do not eat  after midnight.    You may drink clear liquids until 4:30 am .    Clear liquids allowed are:                    Water, Carbonated beverages (diabetics please choose diet or no sugar options), Clear Tea (No creamer, milk, or cream, including half & half and powdered creamer), Black Coffee Only (No creamer, milk or cream, including half & half and powdered creamer), and Clear Sports drink (No red color; diabetics please choose diet or no sugar options)    Take these medicines the morning of surgery with A SIP OF WATER : none    Do not wear jewelry, make-up or nail polish, including gel polish,  artificial nails, or any other type of covering on natural nails (fingers and  toes).  Do not wear lotions, powders, or perfumes, or deodorant.  Do not shave 48 hours prior to surgery.  Men may shave face and neck.  Do not bring valuables to the hospital.  [facility] is not responsible for any belongings or valuables.  Contacts, dentures or bridgework may not be worn into surgery.  Leave your suitcase in the car.  After surgery it may be brought to your room.  For patients admitted to the hospital, discharge time will be determined by your treatment team.  Patients discharged the day of surgery will not be allowed to drive home.   Name and phone number of your driver:   family Special instructions:  N/A  Please read over the following fact sheets that you were given. Care and Recovery After Surgery   Minimally Invasive Nephrectomy Minimally invasive nephrectomy is a  surgical procedure to remove a kidney. This procedure is done through several small incisions in the abdomen. You may have surgery to: Remove the entire kidney and some surrounding structures (radical nephrectomy). Remove only the damaged or diseased part of the kidney (partial nephrectomy). You may need this surgery if: Your kidney is severely damaged from disease, infection, or cancer. You were born with an abnormal kidney. You are donating a healthy kidney. Tell a health care provider about: Any allergies you have. All medicines you are taking, including vitamins, herbs, eye drops, creams, and over-the-counter medicines. Any problems you or family members have had with anesthetic medicines. Any blood disorders you have. Any surgeries you have had. Any medical conditions you have. Whether you are pregnant or may be pregnant. What are the risks? Generally, this is a safe procedure. However, problems may occur, including: Bleeding. Infection. Damage to other body structures near the kidney. Urine leaking into the abdomen. Pneumonia. A blood clot that forms in the leg and travels to the lung (pulmonary embolism). Allergic reactions to medicines. A bone (usually part of a rib) or more tissue may need to be removed so the surgeon can get to the kidney. If this happens, the minimally invasive procedure may need to be changed to an open procedure. What happens before the procedure? Staying hydrated Follow instructions  from your health care provider about hydration, which may include: Up to 2 hours before the procedure - you may continue to drink clear liquids, such as water, clear fruit juice, black coffee, and plain tea.  Eating and drinking restrictions Follow instructions from your health care provider about eating and drinking, which may include: 8 hours before the procedure - stop eating heavy meals or foods, such as meat, fried foods, or fatty foods. 6 hours before the procedure -  stop eating light meals or foods, such as toast or cereal. 6 hours before the procedure - stop drinking milk or drinks that contain milk. 2 hours before the procedure - stop drinking clear liquids. Medicines Ask your health care provider about: Changing or stopping your regular medicines. This is especially important if you are taking diabetes medicines or blood thinners. Taking medicines such as aspirin and ibuprofen. These medicines can thin your blood. Do not take these medicines unless your health care provider tells you to take them. Taking over-the-counter medicines, vitamins, herbs, and supplements. Surgery safety Ask your health care provider: How your surgery site will be marked. What steps will be taken to help prevent infection. These steps may include: Removing hair at the surgery site. Washing skin with a germ-killing soap. Taking antibiotic medicine. General instructions Do not use any products that contain nicotine or tobacco for at least 4 weeks before the procedure. These products include cigarettes, e-cigarettes, and chewing tobacco. If you need help quitting, ask your health care provider. Your health care provider may instruct you to do breathing exercises before the procedure to help prevent pneumonia. Do these exercises as directed. You may need to have your blood drawn (type and cross) in case you need to receive blood (get a transfusion) during the procedure. Plan to have a responsible adult take you home from the hospital or clinic. Plan to have a responsible adult care for you for the time you are told after you leave the hospital or clinic. This is important. What happens during the procedure?     Before surgery begins An IV will be inserted into one of your veins. You will be given a medicine to make you fall asleep (general anesthetic). Your health care provider may take steps to help blood flow in your legs. You may have: Tight stockings, also called  compression stockings, on your legs. Compression sleeves wrapped around your legs. A machine called a sequential compression device (SCD) will pump air into the sleeves. A small, thin tube will be placed in your bladder (urinary catheter) to drain urine. A tube will be placed through your nose and into your stomach (nasogastric tube) to drain stomach fluids. During surgery A small incision will be made in your abdomen to insert a thin, lighted tube (laparoscope) with a camera attached. This lets your surgeon see your kidney during the procedure. More small incisions will be made to insert other surgical tools. One incision may be slightly larger to remove your kidney. In some cases, the surgeon will do the procedure by controlling tools that are attached to a surgical robot. This is called a robot-assisted nephrectomy. The next steps depend on the type of procedure you are having. If you are having a partial nephrectomy: The blood vessels attached to your kidney will be clamped. Part of your kidney will be removed. The kidney will be closed with stitches (sutures), and the clamp on the blood vessels will be removed. If you are having a radical nephrectomy: All of  the blood vessels that attach to your kidney will be closed and cut. Part of the tube that carries urine from your kidney to your bladder (ureter) will be removed. Your kidney will be removed. All of the surgical tools will be removed from your body. A small tube (drain) may be placed near one of the incisions to drain extra fluid from the surgery area. The incisions may be closed with sutures or another type of closure. A bandage (dressing) will be placed over the incision area. The procedure may vary among health care providers and hospitals. What happens after the procedure? Your blood pressure, heart rate, breathing rate, and blood oxygen level will be monitored until you leave the hospital or clinic. Your nasogastric tube will be  removed. You may continue to have: An IV until you can drink fluids on your own. A urinary catheter. Your urine output will be checked. A drain. Your surgical drain will be removed after a few days. Your health care provider will tell you how to care for the drain at home. Pain medicine. This will be given through your IV. Compression stockings or compression sleeves.This helps to prevent blood clots and reduce swelling in your legs. You will be encouraged to: Get out of bed and walk as soon as possible. Do breathing exercises, such as coughing and breathing deeply. This helps prevent pneumonia. Summary Minimally invasive nephrectomy is a surgical procedure to remove a kidney. In some cases, only the damaged or diseased part of the kidney is removed. This procedure is done through several small incisions in the abdomen. Follow instructions from your health care provider about taking medicines and about eating and drinking before the procedure. You will be given a medicine to make you fall asleep (general anesthetic) during the procedure. This information is not intended to replace advice given to you by your health care provider. Make sure you discuss any questions you have with your health care provider. Document Revised: 09/20/2019 Document Reviewed: 09/20/2019 Elsevier Patient Education  2024 [employer]  General Anesthesia, Adult General anesthesia is the use of medicine to make you fall asleep (unconscious) for a medical procedure. General anesthesia must be used for certain procedures. It is often recommended for surgery or procedures that: Last a long time. Require you to be still or in an unusual position. Are major and can cause blood loss. Affect your breathing. The medicines used for general anesthesia are called general anesthetics. During general anesthesia, these medicines are given along with medicines that: Prevent pain. Control your blood pressure. Relax your  muscles. Prevent nausea and vomiting after the procedure. Tell a health care provider about: Any allergies you have. All medicines you are taking, including vitamins, herbs, eye drops, creams, and over-the-counter medicines. Your history of any: Medical conditions you have, including: High blood pressure. Bleeding problems. Diabetes. Heart or lung conditions, such as: Heart failure. Sleep apnea. Asthma. Chronic obstructive pulmonary disease (COPD). Current or recent illnesses, such as: Upper respiratory, chest, or ear infections. Cough or fever. Tobacco or drug use, including marijuana or alcohol use. Depression or anxiety. Surgeries and types of anesthetics you have had. Problems you or family members have had with anesthetic medicines. Whether you are pregnant or may be pregnant. Whether you have any chipped or loose teeth, dentures, caps, bridgework, or issues with your mouth, swallowing, or choking. What are the risks? Your health care provider will talk with you about risks. These may include: Allergic reaction to the medicines. Lung and heart  problems. Inhaling food or liquid from the stomach into the lungs (aspiration). Nerve injury. Injury to the lips, mouth, teeth, or gums. Stroke. Waking up during your procedure and being unable to move. This is rare. These problems are more likely to develop if you are having a major surgery or if you have an advanced or serious medical condition. You can prevent some of these complications by answering all of your health care provider's questions thoroughly and by following all instructions before your procedure. General anesthesia can cause side effects, including: Nausea or vomiting. A sore throat or hoarseness from the breathing tube. Wheezing or coughing. Shaking chills or feeling cold. Body aches. Sleepiness. Confusion, agitation (delirium), or anxiety. What happens before the procedure? When to stop eating and  drinking Follow instructions from your health care provider about what you may eat and drink before your procedure. If you do not follow your health care provider's instructions, your procedure may be delayed or canceled. Medicines Ask your health care provider about: Changing or stopping your regular medicines. These include any diabetes medicines or blood thinners you take. Taking medicines such as aspirin and ibuprofen. These medicines can thin your blood. Do not take them unless your health care provider tells you to. Taking over-the-counter medicines, vitamins, herbs, and supplements. General instructions Do not use any products that contain nicotine or tobacco for at least 4 weeks before the procedure. These products include cigarettes, chewing tobacco, and vaping devices, such as e-cigarettes. If you need help quitting, ask your health care provider. If you brush your teeth on the morning of the procedure, make sure to spit out all of the water and toothpaste. If told by your health care provider, bring your sleep apnea device with you to surgery (if applicable). If you will be going home right after the procedure, plan to have a responsible adult: Take you home from the hospital or clinic. You will not be allowed to drive. Care for you for the time you are told. What happens during the procedure?  An IV will be inserted into one of your veins. You will be given one or more of the following through a face mask or IV: A sedative. This helps you relax. Anesthesia. This will: Numb certain areas of your body. Make you fall asleep for surgery. After you are unconscious, a breathing tube may be inserted down your throat to help you breathe. This will be removed before you wake up. An anesthesia provider, such as an anesthesiologist, will stay with you throughout your procedure. The anesthesia provider will: Keep you comfortable and safe by continuing to give you medicines and adjusting the  amount of medicine that you get. Monitor your blood pressure, heart rate, and oxygen levels to make sure that the anesthetics do not cause any problems. The procedure may vary among health care providers and hospitals. What happens after the procedure? Your blood pressure, temperature, heart rate, breathing rate, and blood oxygen level will be monitored until you leave the hospital or clinic. You will wake up in a recovery area. You may wake up slowly. You may be given medicine to help you with pain, nausea, or any other side effects from the anesthesia. Summary General anesthesia is the use of medicine to make you fall asleep (unconscious) for a medical procedure. Follow your health care provider's instructions about when to stop eating, drinking, or taking certain medicines before your procedure. Plan to have a responsible adult take you home from the hospital or clinic.  This information is not intended to replace advice given to you by your health care provider. Make sure you discuss any questions you have with your health care provider. Document Revised: 08/23/2021 Document Reviewed: 08/23/2021 Elsevier Patient Education  2024 [employer]  How to Use Chlorhexidine Before Surgery Chlorhexidine gluconate (CHG) is a germ-killing (antiseptic) solution that is used to clean the skin. It can get rid of the bacteria that normally live on the skin and can keep them away for about 24 hours. To clean your skin with CHG, you may be given: A CHG solution to use in the shower or as part of a sponge bath. A prepackaged cloth that contains CHG. Cleaning your skin with CHG may help lower the risk for infection: While you are staying in the intensive care unit of the hospital. If you have a vascular access, such as a central line, to provide short-term or long-term access to your veins. If you have a catheter to drain urine from your bladder. If you are on a ventilator. A ventilator is a machine that  helps you breathe by moving air in and out of your lungs. After surgery. What are the risks? Risks of using CHG include: A skin reaction. Hearing loss, if CHG gets in your ears and you have a perforated eardrum. Eye injury, if CHG gets in your eyes and is not rinsed out. The CHG product catching fire. Make sure that you avoid smoking and flames after applying CHG to your skin. Do not use CHG: If you have a chlorhexidine allergy or have previously reacted to chlorhexidine. On babies younger than 59 months of age. How to use CHG solution Use CHG only as told by your health care provider, and follow the instructions on the label. Use the full amount of CHG as directed. Usually, this is one bottle. During a shower Follow these steps when using CHG solution during a shower (unless your health care provider gives you different instructions): Start the shower. Use your normal soap and shampoo to wash your face and hair. Turn off the shower or move out of the shower stream. Pour the CHG onto a clean washcloth. Do not use any type of brush or rough-edged sponge. Starting at your neck, lather your body down to your toes. Make sure you follow these instructions: If you will be having surgery, pay special attention to the part of your body where you will be having surgery. Scrub this area for at least 1 minute. Do not use CHG on your head or face. If the solution gets into your ears or eyes, rinse them well with water. Avoid your genital area. Avoid any areas of skin that have broken skin, cuts, or scrapes. Scrub your back and under your arms. Make sure to wash skin folds. Let the lather sit on your skin for 1-2 minutes or as long as told by your health care provider. Thoroughly rinse your entire body in the shower. Make sure that all body creases and crevices are rinsed well. Dry off with a clean towel. Do not put any substances on your body afterward--such as powder, lotion, or perfume--unless you  are told to do so by your health care provider. Only use lotions that are recommended by the manufacturer. Put on clean clothes or pajamas. If it is the night before your surgery, sleep in clean sheets.  During a sponge bath Follow these steps when using CHG solution during a sponge bath (unless your health care provider gives you  different instructions): Use your normal soap and shampoo to wash your face and hair. Pour the CHG onto a clean washcloth. Starting at your neck, lather your body down to your toes. Make sure you follow these instructions: If you will be having surgery, pay special attention to the part of your body where you will be having surgery. Scrub this area for at least 1 minute. Do not use CHG on your head or face. If the solution gets into your ears or eyes, rinse them well with water. Avoid your genital area. Avoid any areas of skin that have broken skin, cuts, or scrapes. Scrub your back and under your arms. Make sure to wash skin folds. Let the lather sit on your skin for 1-2 minutes or as long as told by your health care provider. Using a different clean, wet washcloth, thoroughly rinse your entire body. Make sure that all body creases and crevices are rinsed well. Dry off with a clean towel. Do not put any substances on your body afterward--such as powder, lotion, or perfume--unless you are told to do so by your health care provider. Only use lotions that are recommended by the manufacturer. Put on clean clothes or pajamas. If it is the night before your surgery, sleep in clean sheets. How to use CHG prepackaged cloths Only use CHG cloths as told by your health care provider, and follow the instructions on the label. Use the CHG cloth on clean, dry skin. Do not use the CHG cloth on your head or face unless your health care provider tells you to. When washing with the CHG cloth: Avoid your genital area. Avoid any areas of skin that have broken skin, cuts, or  scrapes. Before surgery Follow these steps when using a CHG cloth to clean before surgery (unless your health care provider gives you different instructions): Using the CHG cloth, vigorously scrub the part of your body where you will be having surgery. Scrub using a back-and-forth motion for 3 minutes. The area on your body should be completely wet with CHG when you are done scrubbing. Do not rinse. Discard the cloth and let the area air-dry. Do not put any substances on the area afterward, such as powder, lotion, or perfume. Put on clean clothes or pajamas. If it is the night before your surgery, sleep in clean sheets.  For general bathing Follow these steps when using CHG cloths for general bathing (unless your health care provider gives you different instructions). Use a separate CHG cloth for each area of your body. Make sure you wash between any folds of skin and between your fingers and toes. Wash your body in the following order, switching to a new cloth after each step: The front of your neck, shoulders, and chest. Both of your arms, under your arms, and your hands. Your stomach and groin area, avoiding the genitals. Your right leg and foot. Your left leg and foot. The back of your neck, your back, and your buttocks. Do not rinse. Discard the cloth and let the area air-dry. Do not put any substances on your body afterward--such as powder, lotion, or perfume--unless you are told to do so by your health care provider. Only use lotions that are recommended by the manufacturer. Put on clean clothes or pajamas. Contact a health care provider if: Your skin gets irritated after scrubbing. You have questions about using your solution or cloth. You swallow any chlorhexidine. Call your local poison control center [phone] 399-1094 in the [place] Get help right  away if: Your eyes itch badly, or they become very red or swollen. Your skin itches badly and is red or swollen. Your hearing  changes. You have trouble seeing. You have swelling or tingling in your mouth or throat. You have trouble breathing. These symptoms may represent a serious problem that is an emergency. Do not wait to see if the symptoms will go away. Get medical help right away. Call your local emergency services (911 in the [place] Do not drive yourself to the hospital. Summary Chlorhexidine gluconate (CHG) is a germ-killing (antiseptic) solution that is used to clean the skin. Cleaning your skin with CHG may help to lower your risk for infection. You may be given CHG to use for bathing. It may be in a bottle or in a prepackaged cloth to use on your skin. Carefully follow your health care provider's instructions and the instructions on the product label. Do not use CHG if you have a chlorhexidine allergy. Contact your health care provider if your skin gets irritated after scrubbing. This information is not intended to replace advice given to you by your health care provider. Make sure you discuss any questions you have with your health care provider. Document Revised: 09/24/2021 Document Reviewed: 08/07/2020 Elsevier Patient Education  2023 [employer].

## 2023-04-10 ENCOUNTER — Other Ambulatory Visit: Payer: Self-pay

## 2023-04-10 ENCOUNTER — Encounter (HOSPITAL_COMMUNITY)
Admission: RE | Admit: 2023-04-10 | Discharge: 2023-04-10 | Disposition: A | Discharge status: Home / Self Care | Payer: Medicare HMO | Source: Ambulatory Visit | Attending: Urology | Admitting: Urology

## 2023-04-10 VITALS — BP 129/72 | HR 56 | Temp 97.6°F | Resp 18 | Ht 66.0 in | Wt 175.9 lb

## 2023-04-10 DIAGNOSIS — Z01818 Encounter for other preprocedural examination: Secondary | ICD-10-CM | POA: Insufficient documentation

## 2023-04-10 DIAGNOSIS — N2889 Other specified disorders of kidney and ureter: Secondary | ICD-10-CM | POA: Diagnosis not present

## 2023-04-10 DIAGNOSIS — Z01810 Encounter for preprocedural cardiovascular examination: Secondary | ICD-10-CM | POA: Diagnosis present

## 2023-04-10 DIAGNOSIS — Z01812 Encounter for preprocedural laboratory examination: Secondary | ICD-10-CM | POA: Diagnosis present

## 2023-04-10 DIAGNOSIS — D500 Iron deficiency anemia secondary to blood loss (chronic): Secondary | ICD-10-CM | POA: Diagnosis not present

## 2023-04-10 DIAGNOSIS — I10 Essential (primary) hypertension: Secondary | ICD-10-CM | POA: Diagnosis not present

## 2023-04-10 HISTORY — DX: Essential (primary) hypertension: I10

## 2023-04-10 LAB — CBC WITH DIFFERENTIAL/PLATELET
Abs Immature Granulocytes: 0.02 10*3/uL (ref 0.00–0.07)
Basophils Absolute: 0.1 10*3/uL (ref 0.0–0.1)
Basophils Relative: 1 %
Eosinophils Absolute: 0.2 10*3/uL (ref 0.0–0.5)
Eosinophils Relative: 2 %
HCT: 49.8 % (ref 39.0–52.0)
Hemoglobin: 16.8 g/dL (ref 13.0–17.0)
Immature Granulocytes: 0 %
Lymphocytes Relative: 37 %
Lymphs Abs: 2.5 10*3/uL (ref 0.7–4.0)
MCH: 30.7 pg (ref 26.0–34.0)
MCHC: 33.7 g/dL (ref 30.0–36.0)
MCV: 90.9 fL (ref 80.0–100.0)
Monocytes Absolute: 0.5 10*3/uL (ref 0.1–1.0)
Monocytes Relative: 7 %
Neutro Abs: 3.4 10*3/uL (ref 1.7–7.7)
Neutrophils Relative %: 53 %
Platelets: 210 10*3/uL (ref 150–400)
RBC: 5.48 MIL/uL (ref 4.22–5.81)
RDW: 12.3 % (ref 11.5–15.5)
WBC: 6.6 10*3/uL (ref 4.0–10.5)
nRBC: 0 % (ref 0.0–0.2)

## 2023-04-10 LAB — COMPREHENSIVE METABOLIC PANEL
ALT: 24 U/L (ref 0–44)
AST: 24 U/L (ref 15–41)
Albumin: 4.7 g/dL (ref 3.5–5.0)
Alkaline Phosphatase: 77 U/L (ref 38–126)
Anion gap: 11 (ref 5–15)
BUN: 10 mg/dL (ref 8–23)
CO2: 23 mmol/L (ref 22–32)
Calcium: 9.8 mg/dL (ref 8.9–10.3)
Chloride: 99 mmol/L (ref 98–111)
Creatinine, Ser: 0.9 mg/dL (ref 0.61–1.24)
GFR, Estimated: >60 mL/min (ref >60)
Glucose, Bld: 106 mg/dL — ABNORMAL HIGH (ref 70–99)
Potassium: 5.6 mmol/L — ABNORMAL HIGH (ref 3.5–5.1)
Sodium: 133 mmol/L — ABNORMAL LOW (ref 135–145)
Total Bilirubin: 1.3 mg/dL — ABNORMAL HIGH (ref 0.3–1.2)
Total Protein: 7.3 g/dL (ref 6.5–8.1)

## 2023-04-10 LAB — TYPE AND SCREEN
ABO/RH(D): O NEG
Antibody Screen: NEGATIVE

## 2023-04-11 NOTE — Progress Notes (Signed)
Dr Johnnette Litter and Dr Ronne Binning notified of K+ 5.6.  K+ will be repeated prior to surgery per Dr Johnnette Litter.

## 2023-04-14 ENCOUNTER — Inpatient Hospital Stay (HOSPITAL_COMMUNITY): Payer: Medicare HMO | Admitting: Certified Registered Nurse Anesthetist

## 2023-04-14 ENCOUNTER — Inpatient Hospital Stay (HOSPITAL_COMMUNITY)
Admission: RE | Admit: 2023-04-14 | Discharge: 2023-04-16 | DRG: 660 | Disposition: A | Discharge status: Home / Self Care | Payer: Medicare HMO | Attending: Urology | Admitting: Urology

## 2023-04-14 ENCOUNTER — Encounter (HOSPITAL_COMMUNITY)
Admission: RE | Disposition: A | Discharge status: Home / Self Care | Payer: Self-pay | Source: Home / Self Care | Attending: Urology

## 2023-04-14 ENCOUNTER — Other Ambulatory Visit: Payer: Self-pay

## 2023-04-14 ENCOUNTER — Encounter (HOSPITAL_COMMUNITY): Payer: Self-pay | Admitting: Urology

## 2023-04-14 DIAGNOSIS — K660 Peritoneal adhesions (postprocedural) (postinfection): Secondary | ICD-10-CM | POA: Diagnosis present

## 2023-04-14 DIAGNOSIS — N2889 Other specified disorders of kidney and ureter: Secondary | ICD-10-CM | POA: Diagnosis present

## 2023-04-14 DIAGNOSIS — Z811 Family history of alcohol abuse and dependence: Secondary | ICD-10-CM

## 2023-04-14 DIAGNOSIS — Z823 Family history of stroke: Secondary | ICD-10-CM

## 2023-04-14 DIAGNOSIS — C641 Malignant neoplasm of right kidney, except renal pelvis: Principal | ICD-10-CM

## 2023-04-14 DIAGNOSIS — E871 Hypo-osmolality and hyponatremia: Secondary | ICD-10-CM | POA: Diagnosis present

## 2023-04-14 DIAGNOSIS — K219 Gastro-esophageal reflux disease without esophagitis: Secondary | ICD-10-CM | POA: Diagnosis present

## 2023-04-14 DIAGNOSIS — Z85038 Personal history of other malignant neoplasm of large intestine: Secondary | ICD-10-CM

## 2023-04-14 DIAGNOSIS — Z87891 Personal history of nicotine dependence: Secondary | ICD-10-CM

## 2023-04-14 DIAGNOSIS — Z9049 Acquired absence of other specified parts of digestive tract: Secondary | ICD-10-CM | POA: Diagnosis not present

## 2023-04-14 DIAGNOSIS — I10 Essential (primary) hypertension: Secondary | ICD-10-CM | POA: Diagnosis present

## 2023-04-14 DIAGNOSIS — Z8279 Family history of other congenital malformations, deformations and chromosomal abnormalities: Secondary | ICD-10-CM

## 2023-04-14 DIAGNOSIS — E785 Hyperlipidemia, unspecified: Secondary | ICD-10-CM | POA: Diagnosis present

## 2023-04-14 DIAGNOSIS — E875 Hyperkalemia: Secondary | ICD-10-CM

## 2023-04-14 HISTORY — PX: ROBOT ASSISTED LAPAROSCOPIC NEPHRECTOMY: SHX5140

## 2023-04-14 LAB — POCT I-STAT, CHEM 8
BUN: 10 mg/dL (ref 8–23)
Calcium, Ion: 1.24 mmol/L (ref 1.15–1.40)
Chloride: 98 mmol/L (ref 98–111)
Creatinine, Ser: 0.9 mg/dL (ref 0.61–1.24)
Glucose, Bld: 85 mg/dL (ref 70–99)
HCT: 52 % (ref 39.0–52.0)
Hemoglobin: 17.7 g/dL — ABNORMAL HIGH (ref 13.0–17.0)
Potassium: 4.1 mmol/L (ref 3.5–5.1)
Sodium: 136 mmol/L (ref 135–145)
TCO2: 23 mmol/L (ref 22–32)

## 2023-04-14 LAB — CBC
HCT: 45 % (ref 39.0–52.0)
Hemoglobin: 15.2 g/dL (ref 13.0–17.0)
MCH: 30.9 pg (ref 26.0–34.0)
MCHC: 33.8 g/dL (ref 30.0–36.0)
MCV: 91.5 fL (ref 80.0–100.0)
Platelets: 180 10*3/uL (ref 150–400)
RBC: 4.92 MIL/uL (ref 4.22–5.81)
RDW: 12.3 % (ref 11.5–15.5)
WBC: 14 10*3/uL — ABNORMAL HIGH (ref 4.0–10.5)
nRBC: 0 % (ref 0.0–0.2)

## 2023-04-14 LAB — BASIC METABOLIC PANEL
Anion gap: 7 (ref 5–15)
BUN: 11 mg/dL (ref 8–23)
CO2: 24 mmol/L (ref 22–32)
Calcium: 8.6 mg/dL — ABNORMAL LOW (ref 8.9–10.3)
Chloride: 101 mmol/L (ref 98–111)
Creatinine, Ser: 0.92 mg/dL (ref 0.61–1.24)
GFR, Estimated: >60 mL/min (ref >60)
Glucose, Bld: 111 mg/dL — ABNORMAL HIGH (ref 70–99)
Potassium: 4.1 mmol/L (ref 3.5–5.1)
Sodium: 132 mmol/L — ABNORMAL LOW (ref 135–145)

## 2023-04-14 SURGERY — NEPHRECTOMY, RADICAL, ROBOT-ASSISTED, LAPAROSCOPIC, ADULT
Anesthesia: General | Site: Abdomen | Laterality: Right

## 2023-04-14 MED ORDER — ACETAMINOPHEN 325 MG PO TABS
650.0000 mg | ORAL_TABLET | ORAL | Status: DC | PRN
Start: 2023-04-14 — End: 2023-04-16

## 2023-04-14 MED ORDER — BUPIVACAINE HCL (PF) 0.5 % IJ SOLN
INTRAMUSCULAR | Status: AC
Start: 2023-04-14 — End: ?
  Filled 2023-04-14: qty 30

## 2023-04-14 MED ORDER — ZOLPIDEM TARTRATE 5 MG PO TABS: 5.0000 mg | ORAL_TABLET | Freq: Every evening | ORAL | Status: DC | PRN

## 2023-04-14 MED ORDER — PROPOFOL 10 MG/ML IV BOLUS
INTRAVENOUS | Status: DC | PRN
  Administered 2023-04-14: 40 mg via INTRAVENOUS
  Administered 2023-04-14: 150 mg via INTRAVENOUS

## 2023-04-14 MED ORDER — LISINOPRIL 10 MG PO TABS
10.0000 mg | ORAL_TABLET | Freq: Every day | ORAL | Status: DC
  Administered 2023-04-14 – 2023-04-16 (×3): 10 mg via ORAL
  Filled 2023-04-14 (×3): qty 1

## 2023-04-14 MED ORDER — ALBUMIN HUMAN 5 % IV SOLN: INTRAVENOUS | Status: DC | PRN

## 2023-04-14 MED ORDER — OXYCODONE HCL 5 MG/5ML PO SOLN: 5.0000 mg | Freq: Once | ORAL | Status: DC | PRN

## 2023-04-14 MED ORDER — STERILE WATER FOR IRRIGATION IR SOLN
Status: DC | PRN
  Administered 2023-04-14: 500 mL

## 2023-04-14 MED ORDER — ALBUMIN HUMAN 5 % IV SOLN
INTRAVENOUS | Status: AC
Start: 2023-04-14 — End: ?
  Filled 2023-04-14: qty 250

## 2023-04-14 MED ORDER — ORAL CARE MOUTH RINSE
15.0000 mL | Freq: Once | OROMUCOSAL | Status: AC
  Administered 2023-04-14: 15 mL via OROMUCOSAL

## 2023-04-14 MED ORDER — DIPHENHYDRAMINE HCL 12.5 MG/5ML PO ELIX: 12.5000 mg | ORAL_SOLUTION | Freq: Four times a day (QID) | ORAL | Status: DC | PRN

## 2023-04-14 MED ORDER — VASOPRESSIN 20 UNIT/ML IV SOLN
INTRAVENOUS | Status: DC | PRN
  Administered 2023-04-14: 2 [IU] via INTRAVENOUS

## 2023-04-14 MED ORDER — FENTANYL CITRATE (PF) 100 MCG/2ML IJ SOLN
INTRAMUSCULAR | Status: AC
  Filled 2023-04-14: qty 2

## 2023-04-14 MED ORDER — ROCURONIUM BROMIDE 10 MG/ML (PF) SYRINGE
PREFILLED_SYRINGE | INTRAVENOUS | Status: DC | PRN
  Administered 2023-04-14: 30 mg via INTRAVENOUS
  Administered 2023-04-14: 70 mg via INTRAVENOUS

## 2023-04-14 MED ORDER — CEFAZOLIN SODIUM-DEXTROSE 2-4 GM/100ML-% IV SOLN
2.0000 g | Freq: Three times a day (TID) | INTRAVENOUS | Status: AC
  Administered 2023-04-14 – 2023-04-15 (×2): 2 g via INTRAVENOUS
  Filled 2023-04-14 (×2): qty 100

## 2023-04-14 MED ORDER — SUGAMMADEX SODIUM 200 MG/2ML IV SOLN
INTRAVENOUS | Status: DC | PRN
  Administered 2023-04-14: 200 mg via INTRAVENOUS

## 2023-04-14 MED ORDER — DEXTROSE-SODIUM CHLORIDE 5-0.45 % IV SOLN: INTRAVENOUS | Status: AC

## 2023-04-14 MED ORDER — HYDROMORPHONE HCL 1 MG/ML IJ SOLN
INTRAMUSCULAR | Status: AC
  Filled 2023-04-14: qty 1

## 2023-04-14 MED ORDER — DEXAMETHASONE SODIUM PHOSPHATE 10 MG/ML IJ SOLN
INTRAMUSCULAR | Status: DC | PRN
  Administered 2023-04-14: 10 mg via INTRAVENOUS

## 2023-04-14 MED ORDER — OXYBUTYNIN CHLORIDE 5 MG PO TABS: 5.0000 mg | ORAL_TABLET | Freq: Three times a day (TID) | ORAL | Status: DC | PRN

## 2023-04-14 MED ORDER — PHENYLEPHRINE 80 MCG/ML (10ML) SYRINGE FOR IV PUSH (FOR BLOOD PRESSURE SUPPORT)
PREFILLED_SYRINGE | INTRAVENOUS | Status: DC | PRN
  Administered 2023-04-14: 80 ug via INTRAVENOUS

## 2023-04-14 MED ORDER — ONDANSETRON HCL 4 MG/2ML IJ SOLN
4.0000 mg | INTRAMUSCULAR | Status: DC | PRN
Start: 2023-04-14 — End: 2023-04-16
  Administered 2023-04-14: 4 mg via INTRAVENOUS
  Filled 2023-04-14: qty 2

## 2023-04-14 MED ORDER — LACTATED RINGERS IV SOLN: INTRAVENOUS | Status: DC

## 2023-04-14 MED ORDER — CEFAZOLIN SODIUM-DEXTROSE 2-3 GM-%(50ML) IV SOLR
INTRAVENOUS | Status: DC | PRN
  Administered 2023-04-14: 2 g via INTRAVENOUS

## 2023-04-14 MED ORDER — FENTANYL CITRATE PF 50 MCG/ML IJ SOSY
25.0000 ug | PREFILLED_SYRINGE | INTRAMUSCULAR | Status: DC | PRN
Start: 2023-04-14 — End: 2023-04-14

## 2023-04-14 MED ORDER — DIPHENHYDRAMINE HCL 50 MG/ML IJ SOLN: 12.5000 mg | Freq: Four times a day (QID) | INTRAMUSCULAR | Status: DC | PRN

## 2023-04-14 MED ORDER — ATORVASTATIN CALCIUM 20 MG PO TABS
20.0000 mg | ORAL_TABLET | Freq: Every day | ORAL | Status: DC
  Administered 2023-04-14 – 2023-04-15 (×2): 20 mg via ORAL
  Filled 2023-04-14 (×3): qty 1

## 2023-04-14 MED ORDER — OXYCODONE HCL 5 MG PO TABS: 5.0000 mg | ORAL_TABLET | Freq: Once | ORAL | Status: DC | PRN

## 2023-04-14 MED ORDER — BUPIVACAINE HCL (PF) 0.5 % IJ SOLN
INTRAMUSCULAR | Status: AC
  Filled 2023-04-14: qty 30

## 2023-04-14 MED ORDER — DEXMEDETOMIDINE HCL IN NACL 80 MCG/20ML IV SOLN
INTRAVENOUS | Status: DC | PRN
  Administered 2023-04-14: 8 ug via INTRAVENOUS

## 2023-04-14 MED ORDER — CHLORHEXIDINE GLUCONATE 0.12 % MT SOLN: 15.0000 mL | Freq: Once | OROMUCOSAL | Status: AC

## 2023-04-14 MED ORDER — ONDANSETRON HCL 4 MG/2ML IJ SOLN
INTRAMUSCULAR | Status: DC | PRN
  Administered 2023-04-14: 4 mg via INTRAVENOUS

## 2023-04-14 MED ORDER — CEFAZOLIN SODIUM-DEXTROSE 2-4 GM/100ML-% IV SOLN
INTRAVENOUS | Status: AC
  Administered 2023-04-14: 2 g via INTRAVENOUS
  Filled 2023-04-14: qty 100

## 2023-04-14 MED ORDER — ONDANSETRON HCL 4 MG/2ML IJ SOLN: 4.0000 mg | Freq: Once | INTRAMUSCULAR | Status: DC | PRN

## 2023-04-14 MED ORDER — EPHEDRINE SULFATE-NACL 50-0.9 MG/10ML-% IV SOSY
PREFILLED_SYRINGE | INTRAVENOUS | Status: DC | PRN
  Administered 2023-04-14: 15 mg via INTRAVENOUS

## 2023-04-14 MED ORDER — FENTANYL CITRATE (PF) 100 MCG/2ML IJ SOLN
INTRAMUSCULAR | Status: DC | PRN
  Administered 2023-04-14 (×4): 50 ug via INTRAVENOUS

## 2023-04-14 MED ORDER — SENNOSIDES-DOCUSATE SODIUM 8.6-50 MG PO TABS
2.0000 | ORAL_TABLET | Freq: Every day | ORAL | Status: DC
  Administered 2023-04-14 – 2023-04-15 (×2): 2 via ORAL
  Filled 2023-04-14 (×3): qty 2

## 2023-04-14 MED ORDER — HYDROMORPHONE HCL 1 MG/ML IJ SOLN
INTRAMUSCULAR | Status: DC | PRN
  Administered 2023-04-14 (×2): .5 mg via INTRAVENOUS

## 2023-04-14 MED ORDER — HEMOSTATIC AGENTS (NO CHARGE) OPTIME
TOPICAL | Status: DC | PRN
  Administered 2023-04-14: 1 via TOPICAL

## 2023-04-14 MED ORDER — BUPIVACAINE HCL (PF) 0.5 % IJ SOLN
INTRAMUSCULAR | Status: DC | PRN
  Administered 2023-04-14: 60 mL

## 2023-04-14 MED ORDER — HYDROMORPHONE HCL 1 MG/ML IJ SOLN
0.5000 mg | INTRAMUSCULAR | Status: DC | PRN
  Administered 2023-04-14 (×2): 1 mg via INTRAVENOUS
  Filled 2023-04-14 (×2): qty 1

## 2023-04-14 MED ORDER — LIDOCAINE HCL (CARDIAC) PF 100 MG/5ML IV SOSY
PREFILLED_SYRINGE | INTRAVENOUS | Status: DC | PRN
  Administered 2023-04-14: 80 mg via INTRAVENOUS

## 2023-04-14 MED ORDER — MIDAZOLAM HCL 2 MG/2ML IJ SOLN
INTRAMUSCULAR | Status: AC
  Filled 2023-04-14: qty 2

## 2023-04-14 MED ORDER — OXYCODONE HCL 5 MG PO TABS
5.0000 mg | ORAL_TABLET | ORAL | Status: DC | PRN
  Administered 2023-04-15: 5 mg via ORAL
  Filled 2023-04-14: qty 1

## 2023-04-14 MED ORDER — MIDAZOLAM HCL 5 MG/5ML IJ SOLN
INTRAMUSCULAR | Status: DC | PRN
  Administered 2023-04-14: 2 mg via INTRAVENOUS

## 2023-04-14 SURGICAL SUPPLY — 68 items
ADH SKN CLS APL DERMABOND .7 (GAUZE/BANDAGES/DRESSINGS) ×1
ANTIFOG SOL W/FOAM PAD STRL (MISCELLANEOUS) ×1
APL PRP STRL LF DISP 70% ISPRP (MISCELLANEOUS) ×1
BAG LAPAROSCOPIC 12 15 PORT 16 (BASKET) ×1 IMPLANT
BAG RETRIEVAL 12/15 (BASKET) ×1
BLADE SURG 15 STRL LF DISP TIS (BLADE) ×1 IMPLANT
BLADE SURG 15 STRL SS (BLADE) ×1
BULB RESERV EVAC DRAIN JP 100C (MISCELLANEOUS) IMPLANT
CHLORAPREP W/TINT 26 (MISCELLANEOUS) ×1 IMPLANT
CLIP LIGATING HEM O LOK PURPLE (MISCELLANEOUS) ×2 IMPLANT
COVER LIGHT HANDLE STERIS (MISCELLANEOUS) ×1 IMPLANT
COVER MAYO STAND XLG (MISCELLANEOUS) ×1 IMPLANT
COVER TIP SHEARS 8 DVNC (MISCELLANEOUS) ×1 IMPLANT
DERMABOND ADVANCED .7 DNX12 (GAUZE/BANDAGES/DRESSINGS) ×1 IMPLANT
DRAIN CHANNEL RND F F (WOUND CARE) IMPLANT
DRAPE ARM DVNC X/XI (DISPOSABLE) ×4 IMPLANT
DRAPE COLUMN DVNC XI (DISPOSABLE) ×1 IMPLANT
DRAPE HALF SHEET 40X57 (DRAPES) ×1 IMPLANT
DRAPE INCISE IOBAN 66X45 STRL (DRAPES) ×1 IMPLANT
DRSG OPSITE POSTOP 4X8 (GAUZE/BANDAGES/DRESSINGS) IMPLANT
DRSG TEGADERM 2-3/8X2-3/4 SM (GAUZE/BANDAGES/DRESSINGS) IMPLANT
ELECT PENCIL ROCKER SW 15FT (MISCELLANEOUS) ×1 IMPLANT
ELECT REM PT RETURN 15FT ADLT (MISCELLANEOUS) ×1 IMPLANT
FORCEPS BPLR FENES DVNC XI (FORCEP) ×1 IMPLANT
FORCEPS PROGRASP DVNC XI (FORCEP) ×1 IMPLANT
GAUZE SPONGE 2X2 STRL 8-PLY (GAUZE/BANDAGES/DRESSINGS) IMPLANT
GLOVE BIO SURGEON STRL SZ8 (GLOVE) ×2 IMPLANT
GLOVE BIOGEL PI IND STRL 7.0 (GLOVE) ×4 IMPLANT
GLOVE BIOGEL PI IND STRL 8 (GLOVE) ×2 IMPLANT
GLOVE SURG SS PI 8.0 STRL IVOR (GLOVE) IMPLANT
GOWN STRL REUS W/TWL LRG LVL3 (GOWN DISPOSABLE) ×2 IMPLANT
GOWN STRL REUS W/TWL XL LVL3 (GOWN DISPOSABLE) ×2 IMPLANT
HEMOSTAT SURGICEL 4X8 (HEMOSTASIS) IMPLANT
IRRIG SUCT STRYKERFLOW 2 WTIP (MISCELLANEOUS) ×1
IRRIGATION SUCT STRKRFLW 2 WTP (MISCELLANEOUS) ×1 IMPLANT
IV NS IRRIG 3000ML ARTHROMATIC (IV SOLUTION) IMPLANT
KIT TURNOVER KIT A (KITS) IMPLANT
NDL HYPO 21X1.5 SAFETY (NEEDLE) ×1 IMPLANT
NDL INSUFFLATION 14GA 120MM (NEEDLE) ×1 IMPLANT
NEEDLE HYPO 21X1.5 SAFETY (NEEDLE) ×1
NEEDLE INSUFFLATION 14GA 120MM (NEEDLE) ×1
PACK LAP CHOLE LZT030E (CUSTOM PROCEDURE TRAY) ×1 IMPLANT
PAD ARMBOARD 7.5X6 YLW CONV (MISCELLANEOUS) ×3 IMPLANT
POSITIONER HEAD 8X9X4 ADT (SOFTGOODS) ×1 IMPLANT
RELOAD STAPLE 45 2.5 WHT DVNC (STAPLE) IMPLANT
RELOAD STAPLE 45 3.5 BLU DVNC (STAPLE) IMPLANT
RELOAD STAPLE 45 4.3 GRN DVNC (STAPLE) IMPLANT
SCISSORS MNPLR CVD DVNC XI (INSTRUMENTS) ×1 IMPLANT
SEAL UNIV 5-12 XI (MISCELLANEOUS) ×3 IMPLANT
SET BASIN LINEN APH (SET/KITS/TRAYS/PACK) ×1 IMPLANT
SET TUBE DA VINCI INSUFFLATOR (TUBING) IMPLANT
SLEEVE Z-THREAD 5X100MM (TROCAR) ×1 IMPLANT
SOLUTION ANTFG W/FOAM PAD STRL (MISCELLANEOUS) IMPLANT
STAPLER 45 SUREFORM DVNC (STAPLE) ×1 IMPLANT
STAPLER RELOAD 2.5X45 WHT DVNC (STAPLE) ×1
STAPLER RELOAD 3.5X45 BLU DVNC (STAPLE)
STAPLER RELOAD 4.3X45 GRN DVNC (STAPLE)
STAPLER VISISTAT (STAPLE) IMPLANT
SUT ETHILON 3 0 PS 1 (SUTURE) IMPLANT
SUT MNCRL AB 4-0 PS2 18 (SUTURE) IMPLANT
SUT PDS AB 0 CTX 60 (SUTURE) ×1 IMPLANT
SUT VIC AB 2-0 CT2 27 (SUTURE) IMPLANT
SUT VIC AB 2-0 SH 27 (SUTURE)
SUT VIC AB 2-0 SH 27X BRD (SUTURE) ×1 IMPLANT
SYR 20ML LL LF (SYRINGE) ×2 IMPLANT
TRAY FOLEY MTR SLVR 16FR STAT (SET/KITS/TRAYS/PACK) ×1 IMPLANT
TROCAR Z-THREAD FIOS 5X100MM (TROCAR) ×1 IMPLANT
WATER STERILE IRR 500ML POUR (IV SOLUTION) IMPLANT

## 2023-04-14 NOTE — Progress Notes (Signed)
Pt arrived to [address] via stretcher by staff from PACU. Pt able to walked to bed from stretcher. Pt verbalized they were not in pain. Bed in lowest position with call light within reach. Will continue to monitor.

## 2023-04-14 NOTE — H&P (Signed)
HPI: Jack Gibbs is a 67yo here for right radical nephrectomy for a right renal mass. His Mas has increased to 4cm from 3.4cm. He met with Interventional Radiology and he mass was not amenable to ablation.      PMH:     Past Medical History:  Diagnosis Date   Anemia     Cancer (HCC)      Ascending colon s/p right hemicolectomy on 11/10/2017.    GERD (gastroesophageal reflux disease)     Hyperlipidemia     Lower back pain            Surgical History:      Past Surgical History:  Procedure Laterality Date   BACK SURGERY       BIOPSY   10/17/2017    Procedure: BIOPSY;  Surgeon: Corbin Ade, MD;  Location: [facility];  Service: Endoscopy;;   COLON RESECTION N/A 11/10/2017    Procedure: LAPAROSCOPIC PARTIAL COLECTOMY;  Surgeon: Lucretia Roers, MD;  Location: [facility];  Service: General;  Laterality: N/A;   COLONOSCOPY N/A 10/17/2017    Procedure: COLONOSCOPY;  Surgeon: Corbin Ade, MD; bulky apple core tumor in the vicinity of a sending colon.  Diverticulosis in sigmoid and descending colon.  Pathology with invasive adenocarcinoma.   COLONOSCOPY N/A 07/28/2019    Procedure: COLONOSCOPY;  Surgeon: Corbin Ade, MD;  Location: [facility];  Service: Endoscopy;  Laterality: N/A;  2:15pm   IR RADIOLOGIST EVAL & MGMT   01/08/2023   POLYPECTOMY   07/28/2019    Procedure: POLYPECTOMY;  Surgeon: Corbin Ade, MD;  Location: [facility];  Service: Endoscopy;;          Home Medications:  Allergies as of 02/12/2023   No Known Allergies         Medication List           Accurate as of February 12, 2023  9:18 AM. If you have any questions, ask your nurse or doctor.              atorvastatin 20 MG tablet Commonly known as: LIPITOR Take 20 mg by mouth daily.    diphenhydrAMINE 25 MG tablet Commonly known as: BENADRYL Take 25 mg by mouth every 6 (six) hours as needed for allergies.    lisinopril 10 MG tablet Commonly known as: ZESTRIL Take 10 mg by mouth  daily.             Allergies:  Allergies  No Known Allergies     Family History:      Family History  Problem Relation Age of Onset   Stroke Mother     Alcohol abuse Father     Down syndrome Sister     Alcohol abuse Brother     Colon cancer Neg Hx     Ulcers Neg Hx            Social History:  reports that he has never smoked. He quit smokeless tobacco use about 44 years ago.  His smokeless tobacco use included chew. He reports that he does not drink alcohol and does not use drugs.   ROS: All other review of systems were reviewed and are negative except what is noted above in HPI   Physical Exam: BP (!) 169/78   Pulse (!) 54   Constitutional:  Alert and oriented, No acute distress. HEENT: [place] AT, moist mucus membranes.  Trachea midline, no masses. Cardiovascular: No clubbing, cyanosis, or edema. Respiratory: Normal  respiratory effort, no increased work of breathing. GI: Abdomen is soft, nontender, nondistended, no abdominal masses GU: No CVA tenderness.  Lymph: No cervical or inguinal lymphadenopathy. Skin: No rashes, bruises or suspicious lesions. Neurologic: Grossly intact, no focal deficits, moving all 4 extremities. Psychiatric: Normal mood and affect.   Laboratory Data: Recent Labs       Lab Results  Component Value Date    WBC 7.8 12/25/2022    HGB 16.7 12/25/2022    HCT 47.5 12/25/2022    MCV 88.8 12/25/2022    PLT 196 12/25/2022        Recent Labs       Lab Results  Component Value Date    CREATININE 0.94 12/25/2022        Recent Labs  No results found for: "PSA"     Recent Labs  No results found for: "TESTOSTERONE"     Recent Labs  No results found for: "HGBA1C"     Urinalysis Labs (Brief)          Component Value Date/Time    APPEARANCEUR Clear 11/22/2022 1056    GLUCOSEU Negative 11/22/2022 1056    BILIRUBINUR Negative 11/22/2022 1056    PROTEINUR Negative 11/22/2022 1056    NITRITE Negative 11/22/2022 1056     LEUKOCYTESUR Trace (A) 11/22/2022 1056        Recent Labs       Lab Results  Component Value Date    LABMICR See below: 11/22/2022    WBCUA 0-5 11/22/2022    LABEPIT 0-10 11/22/2022    MUCUS None seen 09/27/2022    BACTERIA None seen 11/22/2022        Pertinent Imaging: CT 09/16/2022: Images reviewed and discussed with the patient  No results found for this or any previous visit.   No results found for this or any previous visit.   No results found for this or any previous visit.   No results found for this or any previous visit.   Results for orders placed during the hospital encounter of 09/23/22   Ultrasound renal complete   Narrative CLINICAL DATA:  Renal mass   EXAM: RENAL / URINARY TRACT ULTRASOUND COMPLETE   COMPARISON:  CT abdomen pelvis 09/16/2022; renal ultrasound 09/19/2021   FINDINGS: Right Kidney:   Renal measurements: 11.1 x 5.3 x 4.8 cm = volume: 148.2 mL. Normal renal cortical thickness and echogenicity. No hydronephrosis. There is a 4.4 x 2.4 x 3.2 cm mixed echogenicity mass midpole right kidney, previously measuring 4.1 x 3.0 cm on prior CT.   Left Kidney:   Renal measurements: 11.8 x 5.8 x 5.3 cm = volume: 190.7 mL. Echogenicity within normal limits. No mass or hydronephrosis visualized.   Bladder:   Appears normal for degree of bladder distention.   Other:   None.   IMPRESSION: Mixed echogenicity mass midpole right kidney measures up to 4.4 cm, previously measuring up to 4.1 cm on CT from 09/16/2022. This remains concerning for renal cell carcinoma.     Electronically Signed By: Annia Belt M.D. On: 09/23/2022 14:28   No valid procedures specified. No results found for this or any previous visit.   No results found for this or any previous visit.     Assessment & Plan:     1. History of kidney cancer We discussed the natural hx of renal masses and the 80/20 malignant/benign likelihood. We disucssed the treatment options  including active surveillance. Renal ablation, partial and radical nephrectomy. After discussing options  the patient elects for right radical nephrectomy. Risks/benefits/alternatives discussed

## 2023-04-14 NOTE — Transfer of Care (Addendum)
Immediate Anesthesia Transfer of Care Note  Patient: Jack Gibbs  Procedure(s) Performed: XI ROBOTIC ASSISTED LAPAROSCOPIC NEPHRECTOMY- radical nephrectomy (Right: Abdomen)  Patient Location: PACU  Anesthesia Type:General  Level of Consciousness: drowsy and patient cooperative  Airway & Oxygen Therapy: Patient Spontanous Breathing and Patient connected to face mask oxygen  Post-op Assessment: Report given to RN and Post -op Vital signs reviewed and stable  Post vital signs: Reviewed and stable  Last Vitals:  Vitals Value Taken Time  BP 122/82 04/14/23 1101  Temp 36.5 04/14/23   1100  Pulse 71 04/14/23 1103  Resp 13 04/14/23 1103  SpO2 90 % 04/14/23 1103  Vitals shown include unfiled device data.  Last Pain:  Vitals:   04/14/23 0759  TempSrc: Oral  PainSc: 0-No pain      Patients Stated Pain Goal: 6 (04/14/23 0759)  Complications: No notable events documented.

## 2023-04-14 NOTE — Plan of Care (Signed)
  Problem: Education: Goal: Knowledge of General Education information will improve Description: Including pain rating scale, medication(s)/side effects and non-pharmacologic comfort measures Outcome: Progressing   Problem: Health Behavior/Discharge Planning: Goal: Ability to manage health-related needs will improve Outcome: Progressing   Problem: Activity: Goal: Risk for activity intolerance will decrease Outcome: Progressing   

## 2023-04-14 NOTE — Anesthesia Preprocedure Evaluation (Signed)
Anesthesia Evaluation  Patient identified by MRN, date of birth, ID band Patient awake    Reviewed: Allergy & Precautions, H&P , NPO status , Patient's Chart, lab work & pertinent test results, reviewed documented beta blocker date and time   Airway Mallampati: II  TM Distance: >3 FB Neck ROM: full    Dental no notable dental hx.    Pulmonary neg pulmonary ROS   Pulmonary exam normal breath sounds clear to auscultation       Cardiovascular Exercise Tolerance: Good hypertension, negative cardio ROS  Rhythm:regular Rate:Normal     Neuro/Psych negative neurological ROS  negative psych ROS   GI/Hepatic negative GI ROS, Neg liver ROS,GERD  ,,  Endo/Other  negative endocrine ROS    Renal/GU negative Renal ROS  negative genitourinary   Musculoskeletal   Abdominal   Peds  Hematology negative hematology ROS (+) Blood dyscrasia, anemia   Anesthesia Other Findings    Reproductive/Obstetrics negative OB ROS                             Anesthesia Physical Anesthesia Plan  ASA: 2  Anesthesia Plan: General and General ETT   Post-op Pain Management:    Induction:   PONV Risk Score and Plan: Ondansetron  Airway Management Planned:   Additional Equipment:   Intra-op Plan:   Post-operative Plan: Extubation in OR  Informed Consent: I have reviewed the patients History and Physical, chart, labs and discussed the procedure including the risks, benefits and alternatives for the proposed anesthesia with the patient or authorized representative who has indicated his/her understanding and acceptance.     Dental Advisory Given  Plan Discussed with: CRNA  Anesthesia Plan Comments: (2 IV's)       Anesthesia Quick Evaluation

## 2023-04-14 NOTE — Op Note (Signed)
Preoperative diagnosis: Right renal mass  Postop diagnosis: Same  Procedure: 1.  right robot assisted laparoscopic radical nephrectomy 2.  Lysis of adhesions, extensive  Attending: Wilkie Aye, MD  Assistant: Franky Macho, MD  Anesthesia: General  Estimated blood loss: 100 cc  Drains: 16 French Foley catheter  Specimens: right radical nephrectomy  Antibiotics: ancef  Findings: 1 renal artery and 1 renal vein. Extensive anterior abdominal wall adhesions requiring an additional 15 minutes of operative time  Indications: Patient is a 67 year old with a history of 4 cm right renal mass.  The mass was not amenable to partial nephrectomy.  After discussing treatment options patient decided to proceed with right robot assisted laparoscopic radical nephrectomy.  Procedure in detail: Prior to procedure consent was obtained. Patient was brought to the operating room and briefing was done sure correct patient, correct procedure, correct site.  General anesthesia was in administered patient was placed in the left lateral decubitus position.  a 16 French catheter was placed. their abdomen and flank was then prepped and draped usual sterile fashion.  A Veress needle was used to obtain pneumoperitoneum.  Once pneumoperitoneum was reestablished to 15 mmHg we then placed a 12 mm camera port lateral to the umbilicus at the lateral edge of rectus.  We then proceeded to place 3 more robotic ports. We then placed an assistant port inferior to the camera in the midline. We then placed a 5mm port for the liver retractor in the midline above the assistant port. We then docked the robot.  We then started this dissection by removing a extensive amount of anterior abdominal wall adhesions and adhesions to the liver.  We then dissected along the white line of [place].  We then reflected the colon medially.  We then proceeded to kocherize the duodenum. We then identified the psoas muscle.  Once this was done we  traced it down to the iliac vessels and identified the ureter.  Once we identified the gonadal vein and ureter were then traced this to the renal hilum.  The renal vein and renal artery were skeletonized.  We did we identified one renal vein one renal artery.  Using the [employer] power stapler within ligated the renal artery.  Once this was done we then used a second staple load to ligate the renal vein.  We then used a hem-o-lock clips to ligate the gonadal vein and the ureter.  Once this was done we then freed the kidney from its lateral and posterior attachments.  We then used a Endo Catch bag to remove the specimen.  Once the specimen was in the Endo Catch bag we then inspected the retroperitoneum and noted no residual bleeding.  We then removed our instruments, undocked the robot, and released the pneumoperitoneum.  The assistant was utilized for suction, retraction, and taking down adhesions. We then made a right lower quardant incision to remove the specimen.  Once the specimen was removed we then closed the camera and assistant ports with 0 Vicryl in interrupted fashion.  We then closed the  Right lower quadrant incision with 0 PDS in a running fashion.  We then closed the overlying skin with 2-0 Vicryl in running fashion.  The skin was then closed with staples.  This then concluded the procedure which was well tolerated by the patient.  Complications: None  Condition: Stable, x-rayed, transferred to PACU.  Plan: Patient is to be admitted for inpatient stay. The foley catheter will be removed in the morning. They will be  started on a clear liquid diet POD#1

## 2023-04-14 NOTE — Progress Notes (Signed)
Mobility Specialist Progress Note:    04/14/23 1400  Mobility  Activity Ambulated with assistance in hallway  Level of Assistance Modified independent, requires aide device or extra time  Assistive Device None  Distance Ambulated (ft) 500 ft  Range of Motion/Exercises Active;All extremities  Activity Response Tolerated well  Mobility Referral Yes  $Mobility charge 1 Mobility  Mobility Specialist Start Time (ACUTE ONLY) 1350  Mobility Specialist Stop Time (ACUTE ONLY) 1410  Mobility Specialist Time Calculation (min) (ACUTE ONLY) 20 min   Pt received in bed, family in room. Agreeable to mobility, required ModI to stand and ambulate with no AD. Pt used IV-pole for stability. Tolerated well, asx throughout. Returned pt to room, all needs met.   Lawerance Bach Mobility Specialist Please contact via Special educational needs teacher or  Rehab office at [phone]

## 2023-04-14 NOTE — Anesthesia Procedure Notes (Signed)
Procedure Name: Intubation Date/Time: 04/14/2023 8:42 AM  Performed by: Oletha Cruel, CRNAPre-anesthesia Checklist: Patient identified, Emergency Drugs available, Patient being monitored and Suction available Patient Re-evaluated:Patient Re-evaluated prior to induction Oxygen Delivery Method: Circle system utilized Preoxygenation: Pre-oxygenation with 100% oxygen Induction Type: IV induction Ventilation: Mask ventilation without difficulty Laryngoscope Size: Mac and 4 Grade View: Grade I Tube type: Oral Tube size: 7.0 mm Number of attempts: 1 Airway Equipment and Method: Stylet Placement Confirmation: ETT inserted through vocal cords under direct vision, positive ETCO2, CO2 detector and breath sounds checked- equal and bilateral Tube secured with: Tape Dental Injury: Teeth and Oropharynx as per pre-operative assessment  Comments: Atraumatic intubation. Small abrasion located right lower lip noted post intubation. Patient edentulous.

## 2023-04-14 NOTE — Progress Notes (Signed)
Pt has tolerated being up and walking the unit with family bedside. No c/o pain or discomfort at this time.

## 2023-04-15 ENCOUNTER — Encounter (HOSPITAL_COMMUNITY): Payer: Self-pay | Admitting: Urology

## 2023-04-15 LAB — BASIC METABOLIC PANEL
Anion gap: 9 (ref 5–15)
BUN: 14 mg/dL (ref 8–23)
CO2: 21 mmol/L — ABNORMAL LOW (ref 22–32)
Calcium: 8.2 mg/dL — ABNORMAL LOW (ref 8.9–10.3)
Chloride: 98 mmol/L (ref 98–111)
Creatinine, Ser: 1.45 mg/dL — ABNORMAL HIGH (ref 0.61–1.24)
GFR, Estimated: 52 mL/min — ABNORMAL LOW (ref >60)
Glucose, Bld: 126 mg/dL — ABNORMAL HIGH (ref 70–99)
Potassium: 4.5 mmol/L (ref 3.5–5.1)
Sodium: 128 mmol/L — ABNORMAL LOW (ref 135–145)

## 2023-04-15 LAB — CBC
HCT: 40.4 % (ref 39.0–52.0)
Hemoglobin: 13.5 g/dL (ref 13.0–17.0)
MCH: 30.5 pg (ref 26.0–34.0)
MCHC: 33.4 g/dL (ref 30.0–36.0)
MCV: 91.4 fL (ref 80.0–100.0)
Platelets: 182 10*3/uL (ref 150–400)
RBC: 4.42 MIL/uL (ref 4.22–5.81)
RDW: 12.2 % (ref 11.5–15.5)
WBC: 12.7 10*3/uL — ABNORMAL HIGH (ref 4.0–10.5)
nRBC: 0 % (ref 0.0–0.2)

## 2023-04-15 NOTE — Progress Notes (Signed)
1 Day Post-Op Subjective: Patient reports passing flatus, incisional pain, tolerating PO, and pain control good.  Objective: Vital signs in last 24 hours: Temp:  [98.5 F (36.9 C)-98.7 F (37.1 C)] 98.5 F (36.9 C) (11/05 2007) Pulse Rate:  [63-69] 69 (11/05 2007) Resp:  [18-20] 20 (11/05 2007) BP: (112-148)/(68-80) 148/80 (11/05 2007) SpO2:  [96 %-98 %] 98 % (11/05 2007)  Intake/Output from previous day: 11/04 0701 - 11/05 0700 In: 1930.5 [I.V.:1530.5; IV Piggyback:400] Out: 875 [Urine:775; Blood:100] Intake/Output this shift: No intake/output data recorded.  Physical Exam:  General:alert, cooperative, and appears stated age GI: soft, non tender, normal bowel sounds, no palpable masses, no organomegaly, no inguinal hernia Male genitalia: not done Extremities: extremities normal, atraumatic, no cyanosis or edema  Lab Results: Recent Labs    04/14/23 0846 04/14/23 1130 04/15/23 0435  HGB 17.7* 15.2 13.5  HCT 52.0 45.0 40.4   BMET Recent Labs    04/14/23 1130 04/15/23 0435  NA 132* 128*  K 4.1 4.5  CL 101 98  CO2 24 21*  GLUCOSE 111* 126*  BUN 11 14  CREATININE 0.92 1.45*  CALCIUM 8.6* 8.2*   No results for input(s): "LABPT", "INR" in the last 72 hours. No results for input(s): "LABURIN" in the last 72 hours. Results for orders placed or performed in visit on 11/22/22  Microscopic Examination     Status: None   Collection Time: 11/22/22 10:56 AM   Urine  Result Value Ref Range Status   WBC, UA 0-5 0 - 5 /hpf Final   RBC, Urine 0-2 0 - 2 /hpf Final   Epithelial Cells (non renal) 0-10 0 - 10 /hpf Final   Bacteria, UA None seen None seen/Few Final    Studies/Results: No results found.  Assessment/Plan: POD#1 right radical nephrectomy D/c foley Advance diet as tolerate PT/OT consult Continue current pain control regiment   LOS: 1 day   Wilkie Aye 04/15/2023, 8:24 PM

## 2023-04-15 NOTE — TOC CM/SW Note (Signed)
Transition of Care [facility]) - Inpatient Brief Assessment   Patient Details  Name: CHASKA HAGGER MRN: [id number] Date of Birth: February 15, 1956  Transition of Care [facility]) CM/SW Contact:    Villa Herb, LCSWA Phone Number: 04/15/2023, 8:52 AM   Clinical Narrative: Transition of Care Department [facility]) has reviewed patient and no TOC needs have been identified at this time. We will continue to monitor patient advancement through interdisciplinary progression rounds. If new patient transition needs arise, please place a TOC consult.  Transition of Care Asessment: Insurance and Status: Insurance coverage has been reviewed Patient has primary care physician: Yes Home environment has been reviewed: from home Prior level of function:: independent Prior/Current Home Services: No current home services Social Determinants of Health Reivew: SDOH reviewed no interventions necessary Readmission risk has been reviewed: Yes Transition of care needs: no transition of care needs at this time

## 2023-04-15 NOTE — Progress Notes (Signed)
PT Screen & Discharge Note  Patient Details Name: Jack Gibbs MRN: [id number] DOB: 08/13/1955   Cancelled Treatment:    Reason Eval/Treat Not Completed: PT screened, no needs identified, will sign off   04/15/23 1033  Home Living  Family/patient expects to be discharged to: Private residence  Living Arrangements Alone  Available Help at Discharge Family  Type of Home House  Home Access Stairs to enter  Entrance Stairs-Number of Steps 16 steps down to basement and 3 steps to enter from outside to basement.  Entrance Stairs-Rails None  Home Layout One level  Bathroom Shower/Tub Tub/shower unit  Home Equipment None  Prior Function  Prior Level of Function  Independent/Modified Independent  Mobility Comments Independent and drives  ADLs Comments Independent, no concerns.   No Acute Physical Therapy services identified at this time. Pt is safe to mobilize with Mobility technician and nursing staff a schedule permits. PT to sign off.   Nelida Meuse PT, DPT Physical Therapist with [facility] 336 [phone] office  Nelida Meuse 04/15/2023, 10:34 AM

## 2023-04-15 NOTE — Anesthesia Postprocedure Evaluation (Signed)
Anesthesia Post Note  Patient: BROWNIE NEHME  Procedure(s) Performed: XI ROBOTIC ASSISTED LAPAROSCOPIC NEPHRECTOMY- radical nephrectomy (Right: Abdomen)  Patient location during evaluation: Phase II Anesthesia Type: General Level of consciousness: awake Pain management: pain level controlled Vital Signs Assessment: post-procedure vital signs reviewed and stable Respiratory status: spontaneous breathing and respiratory function stable Cardiovascular status: blood pressure returned to baseline and stable Postop Assessment: no headache and no apparent nausea or vomiting Anesthetic complications: no Comments: Late entry   No notable events documented.   Last Vitals:  Vitals:   04/15/23 1413 04/15/23 2007  BP: 126/70 (!) 148/80  Pulse: 63 69  Resp: 18 20  Temp:  36.9 C  SpO2: 98% 98%    Last Pain:  Vitals:   04/15/23 2007  TempSrc: Oral  PainSc:                  Windell Norfolk

## 2023-04-15 NOTE — Progress Notes (Signed)
Mobility Specialist Progress Note:    04/15/23 0915  Mobility  Activity Ambulated with assistance in hallway  Level of Assistance Modified independent, requires aide device or extra time  Assistive Device None  Distance Ambulated (ft) 700 ft  Range of Motion/Exercises Active;All extremities  Activity Response Tolerated well  Mobility Referral Yes  $Mobility charge 1 Mobility  Mobility Specialist Start Time (ACUTE ONLY) 0915  Mobility Specialist Stop Time (ACUTE ONLY) 0930  Mobility Specialist Time Calculation (min) (ACUTE ONLY) 15 min   Pt received standing EOB, agreeable to mobility. Required ModI to ambulate with no AD. Pt used IV pole for more stability. Tolerated well, asx throughout. Returned pt to room, left sitting EOB. Sister in room, all needs met.   Lawerance Bach Mobility Specialist Please contact via Special educational needs teacher or  Rehab office at [phone]

## 2023-04-15 NOTE — Plan of Care (Signed)

## 2023-04-16 LAB — SURGICAL PATHOLOGY

## 2023-04-16 MED ORDER — OXYCODONE HCL 5 MG PO TABS: 5.0000 mg | ORAL_TABLET | ORAL | 0 refills | Status: DC | PRN

## 2023-04-16 NOTE — Progress Notes (Signed)
IV's removed and discharge instructions reviewed. Script sent to pharmacy and follow up appointment made.  Son driving home

## 2023-04-16 NOTE — Care Management Important Message (Signed)
Important Message  Patient Details  Name: Jack Gibbs MRN: [id number] Date of Birth: [birth year]   Important Message Given:  N/A - LOS <3 / Initial given by admissions     Corey Harold 04/16/2023, 2:44 PM

## 2023-04-16 NOTE — Plan of Care (Signed)
  Problem: Education: Goal: Knowledge of General Education information will improve Description Including pain rating scale, medication(s)/side effects and non-pharmacologic comfort measures Outcome: Adequate for Discharge   Problem: Health Behavior/Discharge Planning: Goal: Ability to manage health-related needs will improve Outcome: Adequate for Discharge   

## 2023-04-16 NOTE — Plan of Care (Signed)

## 2023-04-19 NOTE — Discharge Summary (Signed)
Physician Discharge Summary  Patient ID: Jack Gibbs MRN: [id number] DOB/AGE: May 05, 1956 67 y.o.  Admit date: 04/14/2023 Discharge date: 04/16/2023  Admission Diagnoses:  Right renal mass  Discharge Diagnoses:  Principal Problem:   Right renal mass Active Problems:   Right kidney mass   Past Medical History:  Diagnosis Date   Anemia    Cancer (HCC)    Ascending colon s/p right hemicolectomy on 11/10/2017.    GERD (gastroesophageal reflux disease)    Hyperlipidemia    Hypertension    Lower back pain     Surgeries: Procedure(s): XI ROBOTIC ASSISTED LAPAROSCOPIC NEPHRECTOMY- radical nephrectomy on 04/14/2023   Consultants (if any):   Discharged Condition: Improved  Hospital Course: Jack Gibbs is an 67 y.o. male who was admitted 04/14/2023 with a diagnosis of Right renal mass and went to the operating room on 04/14/2023 and underwent the above named procedures.    He was given perioperative antibiotics:  Anti-infectives (From admission, onward)    Start     Dose/Rate Route Frequency Ordered Stop   04/14/23 1600  ceFAZolin (ANCEF) IVPB 2g/100 mL premix        2 g 200 mL/hr over 30 Minutes Intravenous Every 8 hours 04/14/23 1230 04/15/23 0855   04/14/23 0749  ceFAZolin (ANCEF) 2-4 GM/100ML-% IVPB       Note to Pharmacy: Jack Gibbs B: cabinet override      04/14/23 0749 04/14/23 0844     .  He was given sequential compression devices, early ambulation for DVT prophylaxis.  He benefited maximally from the hospital stay and there were no complications.    Recent vital signs:  Vitals:   04/15/23 2007 04/16/23 0509  BP: (!) 148/80 129/79  Pulse: 69 76  Resp: 20 16  Temp: 98.5 F (36.9 C) 99.1 F (37.3 C)  SpO2: 98% 94%    Recent laboratory studies:  Lab Results  Component Value Date   HGB 13.5 04/15/2023   HGB 15.2 04/14/2023   HGB 17.7 (H) 04/14/2023   Lab Results  Component Value Date   WBC 12.7 (H) 04/15/2023   PLT 182 04/15/2023    No results found for: "INR" Lab Results  Component Value Date   NA 128 (L) 04/15/2023   K 4.5 04/15/2023   CL 98 04/15/2023   CO2 21 (L) 04/15/2023   BUN 14 04/15/2023   CREATININE 1.45 (H) 04/15/2023   GLUCOSE 126 (H) 04/15/2023    Discharge Medications:   Allergies as of 04/16/2023   No Known Allergies      Medication List     TAKE these medications    atorvastatin 20 MG tablet Commonly known as: LIPITOR Take 20 mg by mouth daily.   diphenhydrAMINE 25 MG tablet Commonly known as: BENADRYL Take 25 mg by mouth every 6 (six) hours as needed for allergies.   lisinopril 10 MG tablet Commonly known as: ZESTRIL Take 10 mg by mouth daily.   oxyCODONE 5 MG immediate release tablet Commonly known as: Oxy IR/ROXICODONE Take 1 tablet (5 mg total) by mouth every 4 (four) hours as needed for moderate pain (pain score 4-6).        Diagnostic Studies: No results found.  Disposition: Discharge disposition: 01-Home or Self Care       Discharge Instructions     Discharge patient   Complete by: As directed    Discharge disposition: 01-Home or Self Care   Discharge patient date: 04/16/2023  Follow-up Information     Jack Gibbs, Jack Celeste, MD. Call in 1 week(s).   Specialty: Urology Contact information: [address]  [place] [address] [phone]                  Signed: Wilkie Aye 04/19/2023, 12:59 PM

## 2023-04-21 ENCOUNTER — Encounter: Payer: Self-pay | Admitting: Urology

## 2023-04-21 ENCOUNTER — Ambulatory Visit: Payer: Medicare HMO | Admitting: Urology

## 2023-04-21 VITALS — BP 148/73 | HR 60

## 2023-04-21 DIAGNOSIS — Z85528 Personal history of other malignant neoplasm of kidney: Secondary | ICD-10-CM | POA: Diagnosis not present

## 2023-04-21 DIAGNOSIS — Z08 Encounter for follow-up examination after completed treatment for malignant neoplasm: Secondary | ICD-10-CM

## 2023-04-21 LAB — URINALYSIS, ROUTINE W REFLEX MICROSCOPIC
Bilirubin, UA: NEGATIVE
Glucose, UA: NEGATIVE
Leukocytes,UA: NEGATIVE
Nitrite, UA: NEGATIVE
Protein,UA: NEGATIVE
RBC, UA: NEGATIVE
Specific Gravity, UA: 1.015 (ref 1.005–1.030)
Urobilinogen, Ur: 0.2 mg/dL (ref 0.2–1.0)
pH, UA: 6 (ref 5.0–7.5)

## 2023-04-21 NOTE — Progress Notes (Unsigned)
17 staples removed and steri striped without difficulty.

## 2023-04-21 NOTE — Patient Instructions (Signed)
Kidney Cancer  Kidney cancer is a type of cancer in which an abnormal growth of cells (tumor) forms in one or both kidneys. The kidneys filter waste from your blood and make urine. Kidney cancer may spread to other parts of your body. This type of cancer may also be called renal cell carcinoma. What are the causes? The cause of this condition is not known. What increases the risk? You may be more likely to develop kidney cancer if: You are over age 60. You have certain conditions that are passed from parent to child (inherited). These include von Hippel-Lindau disease, tuberous sclerosis, and hereditary papillary renal carcinoma. You smoke or have been exposed to certain chemicals. You have advanced kidney disease, especially if you need to use a machine to clean your blood (dialysis). You are obese. You have high blood pressure (hypertension). You are male. What are the signs or symptoms? At first, kidney cancer may not cause any symptoms. As the cancer grows, symptoms may include: Blood in the urine. Pain in the upper back or abdomen, just below the rib cage. You may feel pain on one or both sides of your body. Tiredness (fatigue). Weight loss that cannot be explained. Fever. How is this diagnosed? This condition may be diagnosed based on: Your symptoms. Your medical history. A physical exam. You may also have tests, such as: Blood and urine tests. X-rays. Imaging tests, such as CT scans, MRIs, and PET scans. Angiogram. This is when dye is injected into your blood to show your blood vessels. Intravenous pyelogram. This is when dye is injected into your blood to show your kidneys and the other organs that help with making and storing urine. Biopsy. This is when a piece of tissue is removed from your kidney and looked at under a microscope. Your cancer will be assessed (staged), based on how severe it is and how much it has spread. How is this treated? Treatment depends on the type  and stage of the cancer. Treatment may include: Surgery to remove: Just the tumor (nephron-sparing surgery). The entire kidney (nephrectomy). The kidney, some of the healthy tissue around it, nearby lymph nodes, and sometimes the adrenal gland (radical nephrectomy). Medicines to: Kill cancer cells (chemotherapy). Help your body's disease-fighting system (immune system) fight cancer cells. This is known as immunotherapy. Radiation therapy. This uses high-energy rays to kill cancer cells. Targeted therapy to only attack genes and proteins that allow a tumor to grow. This type of therapy tries to limit damage to your healthy cells. Cryoablation. This uses gas or liquid to freeze cancer cells. It is sent through a needle. Radiofrequency ablation. This uses high-energy radio waves to destroy cancer cells. It is sent through a needle-like probe. Embolization. This is a procedure to block the artery that supplies blood to the tumor. Follow these instructions at home: Eating and drinking Some of your treatments may affect your appetite and your ability to chew and swallow. If you have problems eating, or if you do not have an appetite, meet with an expert in diet and nutrition (dietitian). If you have side effects that affect eating, it may help to: Eat smaller meals more often. Eat bland or soft foods. Avoid foods that are hot, spicy, or hard to swallow. Drink shakes or supplements that are high in nutrition and calories. Do not drink alcohol. Lifestyle Do not use any products that contain nicotine or tobacco. These products include cigarettes, chewing tobacco, and vaping devices, such as e-cigarettes. If you need   help quitting, ask your health care provider. Get enough sleep. Most adults need 6-8 hours of sleep each night. During treatment, you may need more sleep. General instructions Take over-the-counter and prescription medicines only as told by your health care provider. Consider joining a  support group. This can help you cope with the stress of having kidney cancer. Work with your health care provider to manage any side effects of treatment. Keep all follow-up visits. Your health care provider will want to make sure your treatment is working. Where to find more information American Cancer Society: cancer.org National Cancer Institute (NCI): cancer.gov Contact a health care provider if: You bruise or bleed easily. You lose weight without trying. You have new or worse fatigue or weakness. You have a fever. Your pain suddenly increases. You have more blood in your urine. Your skin or the white parts of your eyes turn yellow (jaundice). Get help right away if: You have chest pain. You are short of breath. You have irregular heartbeats (palpitations). These symptoms may be an emergency. Get help right away. Call 911. Do not wait to see if the symptoms will go away. Do not drive yourself to the hospital. This information is not intended to replace advice given to you by your health care provider. Make sure you discuss any questions you have with your health care provider. Document Revised: 11/06/2021 Document Reviewed: 11/06/2021 Elsevier Patient Education  2024 [employer] Inc.  

## 2023-04-21 NOTE — Progress Notes (Unsigned)
04/21/2023 8:58 AM   Jack Gibbs 10-Jul-1955 [id number]  Referring provider: No referring provider defined for this encounter.  Followup radical nephrectomy   HPI: Jack Gibbs is a 67yo here for followup after radical nephrectomy. 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    PMH: Past Medical History:  Diagnosis Date   Anemia    Cancer (HCC)    Ascending colon s/p right hemicolectomy on 11/10/2017.    GERD (gastroesophageal reflux disease)    Hyperlipidemia    Hypertension    Lower back pain     Surgical History: Past Surgical History:  Procedure Laterality Date   BACK SURGERY     BIOPSY  10/17/2017   Procedure: BIOPSY;  Surgeon: Corbin Ade, MD;  Location: [facility];  Service: Endoscopy;;   COLON RESECTION N/A 11/10/2017   Procedure: LAPAROSCOPIC PARTIAL COLECTOMY;  Surgeon: Lucretia Roers, MD;  Location: [facility];  Service: General;  Laterality: N/A;   COLONOSCOPY N/A 10/17/2017   Procedure: COLONOSCOPY;  Surgeon: Corbin Ade, MD; bulky apple core tumor in the vicinity of a sending colon.  Diverticulosis in sigmoid and descending colon.  Pathology with invasive adenocarcinoma.   COLONOSCOPY N/A 07/28/2019   Procedure: COLONOSCOPY;  Surgeon: Corbin Ade, MD;  Location: [facility];  Service: Endoscopy;  Laterality: N/A;  2:15pm   IR RADIOLOGIST EVAL & MGMT  01/08/2023   POLYPECTOMY  07/28/2019   Procedure: POLYPECTOMY;  Surgeon: Corbin Ade, MD;  Location: [facility];  Service: Endoscopy;;   ROBOT ASSISTED LAPAROSCOPIC NEPHRECTOMY Right 04/14/2023   Procedure: XI ROBOTIC ASSISTED LAPAROSCOPIC NEPHRECTOMY- radical nephrectomy;  Surgeon: Malen Gauze, MD;  Location: [facility];  Service: Urology;  Laterality: Right;    Home Medications:  Allergies as of 04/21/2023   No Known Allergies      Medication List        Accurate as of April 21, 2023  8:58 AM. If you have any questions, ask your nurse or  doctor.          atorvastatin 20 MG tablet Commonly known as: LIPITOR Take 20 mg by mouth daily.   diphenhydrAMINE 25 MG tablet Commonly known as: BENADRYL Take 25 mg by mouth every 6 (six) hours as needed for allergies.   lisinopril 10 MG tablet Commonly known as: ZESTRIL Take 10 mg by mouth daily.   oxyCODONE 5 MG immediate release tablet Commonly known as: Oxy IR/ROXICODONE Take 1 tablet (5 mg total) by mouth every 4 (four) hours as needed for moderate pain (pain score 4-6).        Allergies: No Known Allergies  Family History: Family History  Problem Relation Age of Onset   Stroke Mother    Alcohol abuse Father    Down syndrome Sister    Alcohol abuse Brother    Colon cancer Neg Hx    Ulcers Neg Hx     Social History:  reports that he has never smoked. He quit smokeless tobacco use about 44 years ago.  His smokeless tobacco use included chew. He reports that he does not drink alcohol and does not use drugs.  ROS: All other review of systems were reviewed and are negative except what is noted above in HPI  Physical Exam: BP (!) 148/73   Pulse 60   Constitutional:  Alert and oriented, No acute distress. HEENT: [place] AT, moist mucus membranes.  Trachea midline, no masses. Cardiovascular: No clubbing, cyanosis, or edema. Respiratory:  Normal respiratory effort, no increased work of breathing. GI: Abdomen is soft, nontender, nondistended, no abdominal masses GU: No CVA tenderness.  Lymph: No cervical or inguinal lymphadenopathy. Skin: No rashes, bruises or suspicious lesions. Neurologic: Grossly intact, no focal deficits, moving all 4 extremities. Psychiatric: Normal mood and affect.  Laboratory Data: Lab Results  Component Value Date   WBC 12.7 (H) 04/15/2023   HGB 13.5 04/15/2023   HCT 40.4 04/15/2023   MCV 91.4 04/15/2023   PLT 182 04/15/2023    Lab Results  Component Value Date   CREATININE 1.45 (H) 04/15/2023    No results found for:  "PSA"  No results found for: "TESTOSTERONE"  No results found for: "HGBA1C"  Urinalysis    Component Value Date/Time   APPEARANCEUR Clear 02/12/2023 0850   GLUCOSEU Negative 02/12/2023 0850   BILIRUBINUR Negative 02/12/2023 0850   PROTEINUR Negative 02/12/2023 0850   NITRITE Negative 02/12/2023 0850   LEUKOCYTESUR Negative 02/12/2023 0850    Lab Results  Component Value Date   LABMICR Comment 02/12/2023   WBCUA 0-5 11/22/2022   LABEPIT 0-10 11/22/2022   MUCUS None seen 09/27/2022   BACTERIA None seen 11/22/2022    Pertinent Imaging: *** No results found for this or any previous visit.  No results found for this or any previous visit.  No results found for this or any previous visit.  No results found for this or any previous visit.  Results for orders placed during the hospital encounter of 09/23/22  Ultrasound renal complete  Narrative CLINICAL DATA:  Renal mass  EXAM: RENAL / URINARY TRACT ULTRASOUND COMPLETE  COMPARISON:  CT abdomen pelvis 09/16/2022; renal ultrasound 09/19/2021  FINDINGS: Right Kidney:  Renal measurements: 11.1 x 5.3 x 4.8 cm = volume: 148.2 mL. Normal renal cortical thickness and echogenicity. No hydronephrosis. There is a 4.4 x 2.4 x 3.2 cm mixed echogenicity mass midpole right kidney, previously measuring 4.1 x 3.0 cm on prior CT.  Left Kidney:  Renal measurements: 11.8 x 5.8 x 5.3 cm = volume: 190.7 mL. Echogenicity within normal limits. No mass or hydronephrosis visualized.  Bladder:  Appears normal for degree of bladder distention.  Other:  None.  IMPRESSION: Mixed echogenicity mass midpole right kidney measures up to 4.4 cm, previously measuring up to 4.1 cm on CT from 09/16/2022. This remains concerning for renal cell carcinoma.   Electronically Signed By: Annia Belt M.D. On: 09/23/2022 14:28  No valid procedures specified. No results found for this or any previous visit.  No results found for this or  any previous visit.   Assessment & Plan:    1. History of kidney cancer *** - Urinalysis, Routine w reflex microscopic   No follow-ups on file.  Wilkie Aye, MD  [facility]

## 2023-04-22 LAB — BASIC METABOLIC PANEL
BUN/Creatinine Ratio: 13 (ref 10–24)
BUN: 16 mg/dL (ref 8–27)
CO2: 22 mmol/L (ref 20–29)
Calcium: 9.4 mg/dL (ref 8.6–10.2)
Chloride: 93 mmol/L — ABNORMAL LOW (ref 96–106)
Creatinine, Ser: 1.26 mg/dL (ref 0.76–1.27)
Glucose: 85 mg/dL (ref 70–99)
Potassium: 5.8 mmol/L — ABNORMAL HIGH (ref 3.5–5.2)
Sodium: 129 mmol/L — ABNORMAL LOW (ref 134–144)
eGFR: 62 mL/min/{1.73_m2} (ref >59)

## 2023-04-28 ENCOUNTER — Ambulatory Visit (INDEPENDENT_AMBULATORY_CARE_PROVIDER_SITE_OTHER): Payer: Medicare HMO

## 2023-04-28 DIAGNOSIS — Z85528 Personal history of other malignant neoplasm of kidney: Secondary | ICD-10-CM

## 2023-04-28 NOTE — Progress Notes (Signed)
Patient presents for staple removal. 16 staples visualized, 16 staples removed today.  No complications were noted. Patient will keep scheduled follow up.

## 2023-05-04 ENCOUNTER — Encounter: Payer: Self-pay | Admitting: Urology

## 2023-05-05 NOTE — Telephone Encounter (Signed)
Please see pt concerns below.  Images attached.

## 2023-05-07 MED ORDER — SULFAMETHOXAZOLE-TRIMETHOPRIM 800-160 MG PO TABS: 1.0000 | ORAL_TABLET | Freq: Two times a day (BID) | ORAL | 0 refills | Status: DC

## 2023-07-03 ENCOUNTER — Inpatient Hospital Stay: Payer: Medicare HMO | Attending: Hematology

## 2023-07-03 DIAGNOSIS — Z87891 Personal history of nicotine dependence: Secondary | ICD-10-CM | POA: Diagnosis not present

## 2023-07-03 DIAGNOSIS — Z85038 Personal history of other malignant neoplasm of large intestine: Secondary | ICD-10-CM | POA: Diagnosis present

## 2023-07-03 DIAGNOSIS — C641 Malignant neoplasm of right kidney, except renal pelvis: Secondary | ICD-10-CM | POA: Diagnosis not present

## 2023-07-03 DIAGNOSIS — Z905 Acquired absence of kidney: Secondary | ICD-10-CM | POA: Diagnosis not present

## 2023-07-03 DIAGNOSIS — C182 Malignant neoplasm of ascending colon: Secondary | ICD-10-CM

## 2023-07-03 LAB — CBC WITH DIFFERENTIAL/PLATELET
Abs Immature Granulocytes: 0.02 K/uL (ref 0.00–0.07)
Basophils Absolute: 0.1 K/uL (ref 0.0–0.1)
Basophils Relative: 1 %
Eosinophils Absolute: 0.2 K/uL (ref 0.0–0.5)
Eosinophils Relative: 2 %
HCT: 40.9 % (ref 39.0–52.0)
Hemoglobin: 13.9 g/dL (ref 13.0–17.0)
Immature Granulocytes: 0 %
Lymphocytes Relative: 31 %
Lymphs Abs: 2.3 K/uL (ref 0.7–4.0)
MCH: 29.7 pg (ref 26.0–34.0)
MCHC: 34 g/dL (ref 30.0–36.0)
MCV: 87.4 fL (ref 80.0–100.0)
Monocytes Absolute: 0.6 K/uL (ref 0.1–1.0)
Monocytes Relative: 8 %
Neutro Abs: 4.2 K/uL (ref 1.7–7.7)
Neutrophils Relative %: 58 %
Platelets: 198 K/uL (ref 150–400)
RBC: 4.68 MIL/uL (ref 4.22–5.81)
RDW: 12.7 % (ref 11.5–15.5)
WBC: 7.3 K/uL (ref 4.0–10.5)
nRBC: 0 % (ref 0.0–0.2)

## 2023-07-03 LAB — COMPREHENSIVE METABOLIC PANEL WITH GFR
ALT: 19 U/L (ref 0–44)
AST: 19 U/L (ref 15–41)
Albumin: 4.5 g/dL (ref 3.5–5.0)
Alkaline Phosphatase: 74 U/L (ref 38–126)
Anion gap: 7 (ref 5–15)
BUN: 9 mg/dL (ref 8–23)
CO2: 23 mmol/L (ref 22–32)
Calcium: 8.9 mg/dL (ref 8.9–10.3)
Chloride: 94 mmol/L — ABNORMAL LOW (ref 98–111)
Creatinine, Ser: 0.95 mg/dL (ref 0.61–1.24)
GFR, Estimated: >60 mL/min (ref >60)
Glucose, Bld: 102 mg/dL — ABNORMAL HIGH (ref 70–99)
Potassium: 4.3 mmol/L (ref 3.5–5.1)
Sodium: 124 mmol/L — ABNORMAL LOW (ref 135–145)
Total Bilirubin: 0.6 mg/dL (ref 0.0–1.2)
Total Protein: 6.4 g/dL — ABNORMAL LOW (ref 6.5–8.1)

## 2023-07-04 LAB — CEA: CEA: 1 ng/mL (ref 0.0–4.7)

## 2023-07-09 NOTE — Progress Notes (Signed)
[facility] [address], [place] [address]    Clinic Day:  07/10/23   Referring physician: [facility]  Patient Care Team: [place] as PCP - General Rourk, Gerrit Friends, MD as Consulting Physician (Gastroenterology)   ASSESSMENT & PLAN:   Assessment: 1.  Stage II (T3N0) colon cancer: -Right hemicolectomy on 11/10/2017, MSI stable. -Colonoscopy on 07/28/2019 showed 5 mm polyp in the sigmoid colon consistent with tubular adenoma.  Exam otherwise normal.   2.  Social/family history: - He is a non-smoker but chewed tobacco and quit at age 33.  No family history of kidney cancer.   Plan: 1.  Stage II colon cancer: - Denies any change in bowel habits.  No bleeding per rectum or melena. - Reviewed labs from 07/02/2022: Normal LFTs and CBC.  CEA was normal at 1.0. - No indication for further imaging.   2.  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 - He underwent right radical nephrectomy on 04/14/2023 by Dr. Ronne Binning. 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  All margins are negative. - No indication for adjuvant therapy.  Surveillance is recommended with imaging every 6 months.  He will have CT CAP without IV contrast done in May.  Orders Placed This Encounter  Procedures   CT CHEST ABDOMEN PELVIS WO CONTRAST    Standing Status:   Future    Expected Date:   01/07/2024    Expiration Date:   07/09/2024    Preferred imaging location?:   [facility]    If indicated for the ordered procedure, I authorize the administration of oral contrast media per Radiology protocol:   Yes    Does the patient have a contrast media/X-ray dye allergy?:   No   CBC with Differential    Standing Status:   Future    Expected Date:   01/01/2024    Expiration Date:   07/09/2024   Comprehensive metabolic panel    Standing Status:    Future    Expected Date:   01/01/2024    Expiration Date:   07/09/2024   CEA    Standing Status:   Future    Expected Date:   01/01/2024    Expiration Date:   07/09/2024     Mikeal Hawthorne R Teague,acting as a scribe for Doreatha Massed, MD.,have documented all relevant documentation on the behalf of Doreatha Massed, MD,as directed by  Doreatha Massed, MD while in the presence of Doreatha Massed, MD.  I, Doreatha Massed MD, have reviewed the above documentation for accuracy and completeness, and I agree with the above.     Doreatha Massed, MD   1/30/20255:33 PM  CHIEF COMPLAINT:   Diagnosis: stage II colon cancer and IDA    Cancer Staging  Clear cell renal cell carcinoma, right (HCC) Staging form: Kidney, AJCC 8th Edition - Clinical stage from 07/10/2023: Stage I (cT1b, cN0, cM0) - Signed by Doreatha Massed, MD on 07/10/2023  Colon cancer [facility]) Staging form: Colon and Rectum, AJCC 8th Edition - Clinical: Stage IIA (cT3, cN0, cM0) - Signed by Doreatha Massed, MD on 11/24/2017    Prior Therapy: 1. Right hemicolectomy on 11/10/2017. 2. Intermittent Feraheme last on 12/08/2017.  Current Therapy:  surveillance    HISTORY OF PRESENT ILLNESS:   Oncology History  Colon cancer (HCC)  10/28/2017 Initial  Diagnosis   Colon cancer (HCC)   11/24/2017 Cancer Staging   Staging form: Colon and Rectum, AJCC 8th Edition - Clinical: Stage IIA (cT3, cN0, cM0) - Signed by Doreatha Massed, MD on 11/24/2017   Clear cell renal cell carcinoma, right (HCC)  07/10/2023 Initial Diagnosis   Clear cell renal cell carcinoma, right (HCC)   07/10/2023 Cancer Staging   Staging form: Kidney, AJCC 8th Edition - Clinical stage from 07/10/2023: Stage I (cT1b, cN0, cM0) - Signed by Doreatha Massed, MD on 07/10/2023 Histopathologic type: Clear cell adenocarcinoma, NOS Stage prefix: Initial diagnosis Histologic grade (G): G2 Histologic grading system: 4 grade system       INTERVAL HISTORY:   Jack Gibbs is a 68 y.o. male presenting to clinic today for follow up of stage II colon cancer and IDA. He was last seen by me on 01/01/23.  Since his last visit, he underwent a right radical nephrectomy on 04/14/23 with Dr. Ronne Binning. Pathology revealed: clear cell renal cell carcinoma, nuclear grade 2, size 4.2 x 3.5 x 3.1 cm. Tumor is limited to the kidney (pT1b).  Today, he states that he is doing well overall. His appetite level is at 100%. His energy level is at 75%.   He notes a normal appetite. He reports intentional weight loss, though he did not intend to lose as much weight as he has. He has lost 25 pounds over the last 3 months. He was told to go on a low protein and low salt diet due to nephrectomy by Dr. Ronne Binning, which he has been following.   He denies any changes in BM's or new onset pains.   PAST MEDICAL HISTORY:   Past Medical History: Past Medical History:  Diagnosis Date   Anemia    Cancer (HCC)    Ascending colon s/p right hemicolectomy on 11/10/2017.    GERD (gastroesophageal reflux disease)    Hyperlipidemia    Hypertension    Lower back pain     Surgical History: Past Surgical History:  Procedure Laterality Date   BACK SURGERY     BIOPSY  10/17/2017   Procedure: BIOPSY;  Surgeon: Corbin Ade, MD;  [facility];  Service: Endoscopy;;   COLON RESECTION N/A 11/10/2017   Procedure: LAPAROSCOPIC PARTIAL COLECTOMY;  Surgeon: Lucretia Roers, MD;  Location: [facility];  Service: General;  Laterality: N/A;   COLONOSCOPY N/A 10/17/2017   Procedure: COLONOSCOPY;  Surgeon: Corbin Ade, MD; bulky apple core tumor in the vicinity of a sending colon.  Diverticulosis in sigmoid and descending colon.  Pathology with invasive adenocarcinoma.   COLONOSCOPY N/A 07/28/2019   Procedure: COLONOSCOPY;  Surgeon: Corbin Ade, MD;  [facility];  Service: Endoscopy;  Laterality: N/A;  2:15pm   IR RADIOLOGIST EVAL & MGMT  01/08/2023    POLYPECTOMY  07/28/2019   Procedure: POLYPECTOMY;  Surgeon: Corbin Ade, MD;  [facility];  Service: Endoscopy;;   ROBOT ASSISTED LAPAROSCOPIC NEPHRECTOMY Right 04/14/2023   Procedure: XI ROBOTIC ASSISTED LAPAROSCOPIC NEPHRECTOMY- radical nephrectomy;  Surgeon: Malen Gauze, MD;  Location: [facility];  Service: Urology;  Laterality: Right;    Social History: Social History   Socioeconomic History   Marital status: Single    Spouse name: Not on file   Number of children: Not on file   Years of education: Not on file   Highest education level: Not on file  Occupational History   Not on file  Tobacco Use   Smoking  status: Never   Smokeless tobacco: Former    Types: Chew    Quit date: 11/25/1978  Vaping Use   Vaping status: Never Used  Substance and Sexual Activity   Alcohol use: No   Drug use: No   Sexual activity: Not on file  Other Topics Concern   Not on file  Social History Narrative   Not on file   Social Drivers of Health   Financial Resource Strain: Not on file  Food Insecurity: No Food Insecurity (04/14/2023)   Hunger Vital Sign    Worried About Running Out of Food in the Last Year: Never true    Ran Out of Food in the Last Year: Never true  Transportation Needs: No Transportation Needs (04/14/2023)   PRAPARE - Administrator, Civil Service (Medical): No    Lack of Transportation (Non-Medical): No  Physical Activity: Not on file  Stress: Not on file  Social Connections: Not on file  Intimate Partner Violence: Not At Risk (04/14/2023)   Humiliation, Afraid, Rape, and Kick questionnaire    Fear of Current or Ex-Partner: No    Emotionally Abused: No    Physically Abused: No    Sexually Abused: No    Family History: Family History  Problem Relation Age of Onset   Stroke Mother    Alcohol abuse Father    Down syndrome Sister    Alcohol abuse Brother    Colon cancer Neg Hx    Ulcers Neg Hx     Current Medications:  Current  Outpatient Medications:    atorvastatin (LIPITOR) 20 MG tablet, Take 20 mg by mouth daily., Disp: , Rfl:    diphenhydrAMINE (BENADRYL) 25 MG tablet, Take 25 mg by mouth every 6 (six) hours as needed for allergies., Disp: , Rfl:    lisinopril (ZESTRIL) 10 MG tablet, Take 10 mg by mouth daily., Disp: , Rfl:    oxyCODONE (OXY IR/ROXICODONE) 5 MG immediate release tablet, Take 1 tablet (5 mg total) by mouth every 4 (four) hours as needed for moderate pain (pain score 4-6)., Disp: 30 tablet, Rfl: 0   sulfamethoxazole-trimethoprim (BACTRIM DS) 800-160 MG tablet, Take 1 tablet by mouth 2 (two) times daily. (Patient not taking: Reported on 07/10/2023), Disp: 14 tablet, Rfl: 0   Allergies: No Known Allergies  REVIEW OF SYSTEMS:   Review of Systems  Constitutional:  Negative for chills, fatigue and fever.  HENT:   Negative for lump/mass, mouth sores, nosebleeds, sore throat and trouble swallowing.   Eyes:  Negative for eye problems.  Respiratory:  Negative for cough and shortness of breath.   Cardiovascular:  Negative for chest pain, leg swelling and palpitations.  Gastrointestinal:  Negative for abdominal pain, constipation, diarrhea, nausea and vomiting.  Genitourinary:  Negative for bladder incontinence, difficulty urinating, dysuria, frequency, hematuria and nocturia.   Musculoskeletal:  Negative for arthralgias, back pain, flank pain, myalgias and neck pain.  Skin:  Negative for itching and rash.  Neurological:  Negative for dizziness, headaches and numbness.  Hematological:  Does not bruise/bleed easily.  Psychiatric/Behavioral:  Negative for depression, sleep disturbance and suicidal ideas. The patient is not nervous/anxious.   All other systems reviewed and are negative.    VITALS:   Blood pressure (!) 141/80, pulse 64, resp. rate 16, weight 150 lb 5.7 oz (68.2 kg), SpO2 99%.  Wt Readings from Last 3 Encounters:  07/10/23 150 lb 5.7 oz (68.2 kg)  04/10/23 175 lb 14.8 oz (79.8 kg)   11/22/22 176  lb (79.8 kg)    Body mass index is 24.27 kg/m.  Performance status (ECOG): 1 - Symptomatic but completely ambulatory  PHYSICAL EXAM:   Physical Exam Vitals and nursing note reviewed. Exam conducted with a chaperone present.  Constitutional:      Appearance: Normal appearance.  Cardiovascular:     Rate and Rhythm: Normal rate and regular rhythm.     Pulses: Normal pulses.     Heart sounds: Normal heart sounds.  Pulmonary:     Effort: Pulmonary effort is normal.     Breath sounds: Normal breath sounds.  Abdominal:     Palpations: Abdomen is soft. There is no hepatomegaly, splenomegaly or mass.     Tenderness: There is no abdominal tenderness.     Comments: +right nephrectomy scar is well-healed  Musculoskeletal:     Right lower leg: No edema.     Left lower leg: No edema.  Lymphadenopathy:     Cervical: No cervical adenopathy.     Right cervical: No superficial, deep or posterior cervical adenopathy.    Left cervical: No superficial, deep or posterior cervical adenopathy.     Upper Body:     Right upper body: No supraclavicular or axillary adenopathy.     Left upper body: No supraclavicular or axillary adenopathy.  Neurological:     General: No focal deficit present.     Mental Status: He is alert and oriented to person, place, and time.  Psychiatric:        Mood and Affect: Mood normal.        Behavior: Behavior normal.     LABS:      Latest Ref Rng & Units 07/03/2023   10:42 AM 04/15/2023    4:35 AM 04/14/2023   11:30 AM  CBC  WBC 4.0 - 10.5 K/uL 7.3  12.7  14.0   Hemoglobin 13.0 - 17.0 g/dL 16.1  09.6  04.5   Hematocrit 39.0 - 52.0 % 40.9  40.4  45.0   Platelets 150 - 400 K/uL 198  182  180       Latest Ref Rng & Units 07/03/2023   10:42 AM 04/21/2023    9:38 AM 04/15/2023    4:35 AM  CMP  Glucose 70 - 99 mg/dL 409  85  811   BUN 8 - 23 mg/dL 9  16  14    Creatinine 0.61 - 1.24 mg/dL 9.14  7.82  9.56   Sodium 135 - 145 mmol/L 124  129  128    Potassium 3.5 - 5.1 mmol/L 4.3  5.8  4.5   Chloride 98 - 111 mmol/L 94  93  98   CO2 22 - 32 mmol/L 23  22  21    Calcium 8.9 - 10.3 mg/dL 8.9  9.4  8.2   Total Protein 6.5 - 8.1 g/dL 6.4     Total Bilirubin 0.0 - 1.2 mg/dL 0.6     Alkaline Phos 38 - 126 U/L 74     AST 15 - 41 U/L 19     ALT 0 - 44 U/L 19        Lab Results  Component Value Date   CEA1 1.0 07/03/2023   /  CEA  Date Value Ref Range Status  07/03/2023 1.0 0.0 - 4.7 ng/mL Final    Comment:    (NOTE)  Nonsmokers          <3.9                             Smokers             <5.6 Roche [employer] Electrochemiluminescence Immunoassay (ECLIA) Values obtained with different assay methods or kits cannot be used interchangeably.  Results cannot be interpreted as absolute evidence of the presence or absence of malignant disease. Performed At: [facility] [address] [place], [place] [id number] Jolene Schimke MD MV:[phone]    No results found for: "PSA1" No results found for: "CAN199" No results found for: "CAN125"  No results found for: "TOTALPROTELP", "ALBUMINELP", "A1GS", "A2GS", "BETS", "BETA2SER", "GAMS", "MSPIKE", "SPEI" Lab Results  Component Value Date   TIBC 493 (H) 11/24/2017   FERRITIN 113 01/27/2019   FERRITIN 92 10/27/2018   FERRITIN 56 04/27/2018   IRONPCTSAT 3 (L) 11/24/2017   Lab Results  Component Value Date   LDH 142 01/27/2019   LDH 155 10/27/2018   LDH 149 04/27/2018     STUDIES:   No results found.

## 2023-07-10 ENCOUNTER — Inpatient Hospital Stay (HOSPITAL_BASED_OUTPATIENT_CLINIC_OR_DEPARTMENT_OTHER): Payer: Medicare HMO | Admitting: Hematology

## 2023-07-10 VITALS — BP 141/80 | HR 64 | Resp 16 | Wt 150.4 lb

## 2023-07-10 DIAGNOSIS — Z85038 Personal history of other malignant neoplasm of large intestine: Secondary | ICD-10-CM | POA: Diagnosis not present

## 2023-07-10 DIAGNOSIS — C182 Malignant neoplasm of ascending colon: Secondary | ICD-10-CM | POA: Diagnosis not present

## 2023-07-10 DIAGNOSIS — C641 Malignant neoplasm of right kidney, except renal pelvis: Secondary | ICD-10-CM | POA: Diagnosis not present

## 2023-07-10 NOTE — Patient Instructions (Addendum)
[facility] Discharge Instructions   You were seen and examined today by Dr. Ellin Saba.  He reviewed the results of your lab work which are mostly normal/stable. Your sodium is quite low at 124. Eat more salt in your diet.   We will see you back in 6 months. We will repeat lab work prior to this visit.    Return as scheduled.    Thank you for choosing [facility] at [facility] to provide your oncology and hematology care.  To afford each patient quality time with our provider, please arrive at least 15 minutes before your scheduled appointment time.   If you have a lab appointment with the Cancer Center please come in thru the Main Entrance and check in at the main information desk.  You need to re-schedule your appointment should you arrive 10 or more minutes late.  We strive to give you quality time with our providers, and arriving late affects you and other patients whose appointments are after yours.  Also, if you no show three or more times for appointments you may be dismissed from the clinic at the providers discretion.     Again, thank you for choosing [facility].  Our hope is that these requests will decrease the amount of time that you wait before being seen by our physicians.       _____________________________________________________________  Should you have questions after your visit to [facility], please contact our office at [phone] and follow the prompts.  Our office hours are 8:00 a.m. and 4:30 p.m. Monday - Friday.  Please note that voicemails left after 4:00 p.m. may not be returned until the following business day.  We are closed weekends and major holidays.  You do have access to a nurse 24-7, just call the main number to the clinic [phone] and do not press any options, hold on the line and a nurse will answer the phone.    For prescription refill requests, have your  pharmacy contact our office and allow 72 hours.    Due to Covid, you will need to wear a mask upon entering the hospital. If you do not have a mask, a mask will be given to you at the Main Entrance upon arrival. For doctor visits, patients may have 1 support person age 30 or older with them. For treatment visits, patients can not have anyone with them due to social distancing guidelines and our immunocompromised population.

## 2023-07-24 ENCOUNTER — Other Ambulatory Visit: Payer: Medicare HMO

## 2023-07-24 ENCOUNTER — Other Ambulatory Visit: Payer: Self-pay

## 2023-07-24 DIAGNOSIS — C641 Malignant neoplasm of right kidney, except renal pelvis: Secondary | ICD-10-CM

## 2023-07-25 LAB — COMPREHENSIVE METABOLIC PANEL
ALT: 23 [IU]/L (ref 0–44)
AST: 22 [IU]/L (ref 0–40)
Albumin: 4.4 g/dL (ref 3.9–4.9)
Alkaline Phosphatase: 96 [IU]/L (ref 44–121)
BUN/Creatinine Ratio: 8 — ABNORMAL LOW (ref 10–24)
BUN: 9 mg/dL (ref 8–27)
Bilirubin Total: 0.5 mg/dL (ref 0.0–1.2)
CO2: 20 mmol/L (ref 20–29)
Calcium: 9.1 mg/dL (ref 8.6–10.2)
Chloride: 96 mmol/L (ref 96–106)
Creatinine, Ser: 1.15 mg/dL (ref 0.76–1.27)
Globulin, Total: 1.7 g/dL (ref 1.5–4.5)
Glucose: 87 mg/dL (ref 70–99)
Potassium: 4.4 mmol/L (ref 3.5–5.2)
Sodium: 131 mmol/L — ABNORMAL LOW (ref 134–144)
Total Protein: 6.1 g/dL (ref 6.0–8.5)
eGFR: 69 mL/min/{1.73_m2} (ref >59)

## 2023-07-29 ENCOUNTER — Ambulatory Visit (HOSPITAL_COMMUNITY)
Admission: RE | Admit: 2023-07-29 | Discharge: 2023-07-29 | Disposition: A | Discharge status: Home / Self Care | Payer: Medicare HMO | Source: Ambulatory Visit | Attending: Urology | Admitting: Urology

## 2023-07-29 DIAGNOSIS — Z85528 Personal history of other malignant neoplasm of kidney: Secondary | ICD-10-CM | POA: Insufficient documentation

## 2023-07-30 ENCOUNTER — Ambulatory Visit: Payer: Medicare HMO | Admitting: Urology

## 2023-07-30 ENCOUNTER — Encounter: Payer: Self-pay | Admitting: Urology

## 2023-07-30 VITALS — BP 154/86 | HR 62

## 2023-07-30 DIAGNOSIS — C649 Malignant neoplasm of unspecified kidney, except renal pelvis: Secondary | ICD-10-CM | POA: Insufficient documentation

## 2023-07-30 DIAGNOSIS — Z85528 Personal history of other malignant neoplasm of kidney: Secondary | ICD-10-CM

## 2023-07-30 DIAGNOSIS — Z08 Encounter for follow-up examination after completed treatment for malignant neoplasm: Secondary | ICD-10-CM | POA: Diagnosis not present

## 2023-07-30 NOTE — Progress Notes (Signed)
07/30/2023 9:46 AM   Jack Gibbs 03-22-1956 [id number]  Referring provider: [facility] [address] [place],  [place] [address]  Followup RCC   HPI: Jack Gibbs is a 67yo here for followup for RCC s/p right radical nephrectomy in 11/24. CMP normal. Creatinine 1.15. CXR from yesterday normal. No significant LUTS.    PMH: Past Medical History:  Diagnosis Date   Anemia    Cancer (HCC)    Ascending colon s/p right hemicolectomy on 11/10/2017.    GERD (gastroesophageal reflux disease)    Hyperlipidemia    Hypertension    Lower back pain     Surgical History: Past Surgical History:  Procedure Laterality Date   BACK SURGERY     BIOPSY  10/17/2017   Procedure: BIOPSY;  Surgeon: Corbin Ade, MD;  [facility];  Service: Endoscopy;;   COLON RESECTION N/A 11/10/2017   Procedure: LAPAROSCOPIC PARTIAL COLECTOMY;  Surgeon: Lucretia Roers, MD;  Location: [facility];  Service: General;  Laterality: N/A;   COLONOSCOPY N/A 10/17/2017   Procedure: COLONOSCOPY;  Surgeon: Corbin Ade, MD; bulky apple core tumor in the vicinity of a sending colon.  Diverticulosis in sigmoid and descending colon.  Pathology with invasive adenocarcinoma.   COLONOSCOPY N/A 07/28/2019   Procedure: COLONOSCOPY;  Surgeon: Corbin Ade, MD;  [facility];  Service: Endoscopy;  Laterality: N/A;  2:15pm   IR RADIOLOGIST EVAL & MGMT  01/08/2023   POLYPECTOMY  07/28/2019   Procedure: POLYPECTOMY;  Surgeon: Corbin Ade, MD;  [facility];  Service: Endoscopy;;   ROBOT ASSISTED LAPAROSCOPIC NEPHRECTOMY Right 04/14/2023   Procedure: XI ROBOTIC ASSISTED LAPAROSCOPIC NEPHRECTOMY- radical nephrectomy;  Surgeon: Malen Gauze, MD;  Location: [facility];  Service: Urology;  Laterality: Right;    Home Medications:  Allergies as of 07/30/2023   No Known Allergies      Medication List        Accurate as of July 30, 2023  9:46 AM. If you have any questions,  ask your nurse or doctor.          atorvastatin 20 MG tablet Commonly known as: LIPITOR Take 20 mg by mouth daily.   diphenhydrAMINE 25 MG tablet Commonly known as: BENADRYL Take 25 mg by mouth every 6 (six) hours as needed for allergies.   lisinopril 10 MG tablet Commonly known as: ZESTRIL Take 10 mg by mouth daily.   oxyCODONE 5 MG immediate release tablet Commonly known as: Oxy IR/ROXICODONE Take 1 tablet (5 mg total) by mouth every 4 (four) hours as needed for moderate pain (pain score 4-6).   sulfamethoxazole-trimethoprim 800-160 MG tablet Commonly known as: BACTRIM DS Take 1 tablet by mouth 2 (two) times daily.        Allergies: No Known Allergies  Family History: Family History  Problem Relation Age of Onset   Stroke Mother    Alcohol abuse Father    Down syndrome Sister    Alcohol abuse Brother    Colon cancer Neg Hx    Ulcers Neg Hx     Social History:  reports that he has never smoked. He quit smokeless tobacco use about 44 years ago.  His smokeless tobacco use included chew. He reports that he does not drink alcohol and does not use drugs.  ROS: All other review of systems were reviewed and are negative except what is noted above in HPI  Physical Exam: BP (!) 154/86  Pulse 62   Constitutional:  Alert and oriented, No acute distress. HEENT: [place] AT, moist mucus membranes.  Trachea midline, no masses. Cardiovascular: No clubbing, cyanosis, or edema. Respiratory: Normal respiratory effort, no increased work of breathing. GI: Abdomen is soft, nontender, nondistended, no abdominal masses GU: No CVA tenderness.  Lymph: No cervical or inguinal lymphadenopathy. Skin: No rashes, bruises or suspicious lesions. Neurologic: Grossly intact, no focal deficits, moving all 4 extremities. Psychiatric: Normal mood and affect.  Laboratory Data: Lab Results  Component Value Date   WBC 7.3 07/03/2023   HGB 13.9 07/03/2023   HCT 40.9 07/03/2023   MCV 87.4  07/03/2023   PLT 198 07/03/2023    Lab Results  Component Value Date   CREATININE 1.15 07/24/2023    No results found for: "PSA"  No results found for: "TESTOSTERONE"  No results found for: "HGBA1C"  Urinalysis    Component Value Date/Time   APPEARANCEUR Clear 04/21/2023 0845   GLUCOSEU Negative 04/21/2023 0845   BILIRUBINUR Negative 04/21/2023 0845   PROTEINUR Negative 04/21/2023 0845   NITRITE Negative 04/21/2023 0845   LEUKOCYTESUR Negative 04/21/2023 0845    Lab Results  Component Value Date   LABMICR Comment 04/21/2023   WBCUA 0-5 11/22/2022   LABEPIT 0-10 11/22/2022   MUCUS None seen 09/27/2022   BACTERIA None seen 11/22/2022    Pertinent Imaging: CXR yesterday: Images reviewed and discussed with the patient  No results found for this or any previous visit.  No results found for this or any previous visit.  No results found for this or any previous visit.  No results found for this or any previous visit.  Results for orders placed during the hospital encounter of 09/23/22  Ultrasound renal complete  Narrative CLINICAL DATA:  Renal mass  EXAM: RENAL / URINARY TRACT ULTRASOUND COMPLETE  COMPARISON:  CT abdomen pelvis 09/16/2022; renal ultrasound 09/19/2021  FINDINGS: Right Kidney:  Renal measurements: 11.1 x 5.3 x 4.8 cm = volume: 148.2 mL. Normal renal cortical thickness and echogenicity. No hydronephrosis. There is a 4.4 x 2.4 x 3.2 cm mixed echogenicity mass midpole right kidney, previously measuring 4.1 x 3.0 cm on prior CT.  Left Kidney:  Renal measurements: 11.8 x 5.8 x 5.3 cm = volume: 190.7 mL. Echogenicity within normal limits. No mass or hydronephrosis visualized.  Bladder:  Appears normal for degree of bladder distention.  Other:  None.  IMPRESSION: Mixed echogenicity mass midpole right kidney measures up to 4.4 cm, previously measuring up to 4.1 cm on CT from 09/16/2022. This remains concerning for renal cell  carcinoma.   Electronically Signed By: Annia Belt M.D. On: 09/23/2022 14:28  No results found for this or any previous visit.  No results found for this or any previous visit.  No results found for this or any previous visit.   Assessment & Plan:    1. Malignant neoplasm of kidney, unspecified laterality (HCC) (Primary) Followupo 6 months with CT, cxr, and CMP - Urinalysis, Routine w reflex microscopic   No follow-ups on file.  Wilkie Aye, MD  [facility] [place]

## 2023-07-30 NOTE — Patient Instructions (Signed)
 Kidney Cancer  Kidney cancer is a type of cancer in which an abnormal growth of cells (tumor) forms in one or both kidneys. The kidneys filter waste from your blood and make urine. Kidney cancer may spread to other parts of your body. This type of cancer may also be called renal cell carcinoma. What are the causes? The cause of this condition is not known. What increases the risk? You may be more likely to develop kidney cancer if: You are over age 69. You have certain conditions that are passed from parent to child (inherited). These include von Hippel-Lindau disease, tuberous sclerosis, and hereditary papillary renal carcinoma. You smoke or have been exposed to certain chemicals. You have advanced kidney disease, especially if you need to use a machine to clean your blood (dialysis). You are obese. You have high blood pressure (hypertension). You are male. What are the signs or symptoms? At first, kidney cancer may not cause any symptoms. As the cancer grows, symptoms may include: Blood in the urine. Pain in the upper back or abdomen, just below the rib cage. You may feel pain on one or both sides of your body. Tiredness (fatigue). Weight loss that cannot be explained. Fever. How is this diagnosed? This condition may be diagnosed based on: Your symptoms. Your medical history. A physical exam. You may also have tests, such as: Blood and urine tests. X-rays. Imaging tests, such as CT scans, MRIs, and PET scans. Angiogram. This is when dye is injected into your blood to show your blood vessels. Intravenous pyelogram. This is when dye is injected into your blood to show your kidneys and the other organs that help with making and storing urine. Biopsy. This is when a piece of tissue is removed from your kidney and looked at under a microscope. Your cancer will be assessed (staged), based on how severe it is and how much it has spread. How is this treated? Treatment depends on the type  and stage of the cancer. Treatment may include: Surgery to remove: Just the tumor (nephron-sparing surgery). The entire kidney (nephrectomy). The kidney, some of the healthy tissue around it, nearby lymph nodes, and sometimes the adrenal gland (radical nephrectomy). Medicines to: Kill cancer cells (chemotherapy). Help your body's disease-fighting system (immune system) fight cancer cells. This is known as immunotherapy. Radiation therapy. This uses high-energy rays to kill cancer cells. Targeted therapy to only attack genes and proteins that allow a tumor to grow. This type of therapy tries to limit damage to your healthy cells. Cryoablation. This uses gas or liquid to freeze cancer cells. It is sent through a needle. Radiofrequency ablation. This uses high-energy radio waves to destroy cancer cells. It is sent through a needle-like probe. Embolization. This is a procedure to block the artery that supplies blood to the tumor. Follow these instructions at home: Eating and drinking Some of your treatments may affect your appetite and your ability to chew and swallow. If you have problems eating, or if you do not have an appetite, meet with an expert in diet and nutrition (dietitian). If you have side effects that affect eating, it may help to: Eat smaller meals more often. Eat bland or soft foods. Avoid foods that are hot, spicy, or hard to swallow. Drink shakes or supplements that are high in nutrition and calories. Do not drink alcohol. Lifestyle Do not use any products that contain nicotine or tobacco. These products include cigarettes, chewing tobacco, and vaping devices, such as e-cigarettes. If you need  help quitting, ask your health care provider. Get enough sleep. Most adults need 6-8 hours of sleep each night. During treatment, you may need more sleep. General instructions Take over-the-counter and prescription medicines only as told by your health care provider. Consider joining a  support group. This can help you cope with the stress of having kidney cancer. Work with your health care provider to manage any side effects of treatment. Keep all follow-up visits. Your health care provider will want to make sure your treatment is working. Where to find more information American Cancer Society: cancer.org [employer] ([employer]): cancer.gov Contact a health care provider if: You bruise or bleed easily. You lose weight without trying. You have new or worse fatigue or weakness. You have a fever. Your pain suddenly increases. You have more blood in your urine. Your skin or the white parts of your eyes turn yellow (jaundice). Get help right away if: You have chest pain. You are short of breath. You have irregular heartbeats (palpitations). These symptoms may be an emergency. Get help right away. Call 911. Do not wait to see if the symptoms will go away. Do not drive yourself to the hospital. This information is not intended to replace advice given to you by your health care provider. Make sure you discuss any questions you have with your health care provider. Document Revised: 11/06/2021 Document Reviewed: 11/06/2021 Elsevier Patient Education  2024 [employer].

## 2023-12-25 ENCOUNTER — Other Ambulatory Visit: Payer: Self-pay

## 2023-12-25 ENCOUNTER — Encounter: Payer: Self-pay | Admitting: Emergency Medicine

## 2023-12-25 ENCOUNTER — Ambulatory Visit: Admission: EM | Admit: 2023-12-25 | Discharge: 2023-12-25 | Disposition: A | Discharge status: Home / Self Care

## 2023-12-25 DIAGNOSIS — R1012 Left upper quadrant pain: Secondary | ICD-10-CM

## 2023-12-25 DIAGNOSIS — S301XXA Contusion of abdominal wall, initial encounter: Secondary | ICD-10-CM | POA: Diagnosis not present

## 2023-12-25 NOTE — Discharge Instructions (Signed)
 Your exam is very reassuring today.  As discussed, we are unable to fully rule out internal injury as we do not have access to CT scanners or MRI machines in the setting but I am reassured by your vitals and exam.  You may apply heating pads, take over-the-counter pain relievers as needed and go to the emergency department for significant worsening symptoms.

## 2023-12-25 NOTE — ED Triage Notes (Signed)
 Pt reports was mowing yard today and was cutting around a telephone pole and reports guide wire pinned pt to lawnmower seat. No bruise or scratch noted in triage. Pt reports several abdominal surgeries, back surgeries and just wants to make sure everything is ok on the inside.

## 2023-12-25 NOTE — ED Provider Notes (Signed)
 [facility]    CSN: [id number] Arrival date & time: 12/25/23  1434      History   Chief Complaint Chief Complaint  Patient presents with   Abdominal Pain    HPI Jack Gibbs is a 67 y.o. male.   Patient presenting today with left upper abdominal pain and slight bruising after running into a telephone pole guidewire with a lawnmower earlier today.  He states it pinned him against the seat for a bit but he did not fall off of the lawnmower, did not lose consciousness and is having no chest pain, shortness of breath, nausea, vomiting, diarrhea, hematemesis, melena, hematuria.  States he has a history of kidney and colon cancer status post multiple surgical procedures and wanting to make sure that everything is okay internally.  So far not trying anything over-the-counter for symptoms.    Past Medical History:  Diagnosis Date   Anemia    Cancer (HCC)    Ascending colon s/p right hemicolectomy on 11/10/2017.    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   Right kidney mass 04/14/2023   Right renal mass 04/14/2023   History of kidney cancer 02/09/2020   History of colon cancer 06/09/2019   Iron deficiency anemia due to chronic blood loss 11/26/2017   Colon cancer (HCC) 10/28/2017   IDA (iron deficiency anemia) 09/11/2017   GERD (gastroesophageal reflux disease) 09/11/2017    Past Surgical History:  Procedure Laterality Date   BACK SURGERY     BIOPSY  10/17/2017   Procedure: BIOPSY;  Surgeon: Shaaron Lamar HERO, MD;  Location: [facility];  Service: Endoscopy;;   COLON RESECTION N/A 11/10/2017   Procedure: LAPAROSCOPIC PARTIAL COLECTOMY;  Surgeon: Kallie Manuelita BROCKS, MD;  Location: [facility];  Service: General;  Laterality: N/A;   COLONOSCOPY N/A  10/17/2017   Procedure: COLONOSCOPY;  Surgeon: Shaaron Lamar HERO, MD; bulky apple core tumor in the vicinity of a sending colon.  Diverticulosis in sigmoid and descending colon.  Pathology with invasive adenocarcinoma.   COLONOSCOPY N/A 07/28/2019   Procedure: COLONOSCOPY;  Surgeon: Shaaron Lamar HERO, MD;  Location: [facility];  Service: Endoscopy;  Laterality: N/A;  2:15pm   IR RADIOLOGIST EVAL & MGMT  01/08/2023   POLYPECTOMY  07/28/2019   Procedure: POLYPECTOMY;  Surgeon: Shaaron Lamar HERO, MD;  Location: [facility];  Service: Endoscopy;;   ROBOT ASSISTED LAPAROSCOPIC NEPHRECTOMY Right 04/14/2023   Procedure: XI ROBOTIC ASSISTED LAPAROSCOPIC NEPHRECTOMY- radical nephrectomy;  Surgeon: Sherrilee Belvie CROME, MD;  Location: [facility];  Service: Urology;  Laterality: Right;       Home Medications    Prior to Admission medications   Medication Sig Start Date End Date Taking? Authorizing Provider  atorvastatin  (LIPITOR) 20 MG tablet Take 20 mg by mouth daily. 01/15/22   [provider]  diphenhydrAMINE  (BENADRYL ) 25 MG tablet Take 25 mg by mouth every 6 (six) hours as needed for allergies.    [provider]  lisinopril  (ZESTRIL ) 10 MG tablet Take 10 mg by mouth daily. 09/17/21   [provider]  oxyCODONE  (OXY IR/ROXICODONE ) 5 MG immediate release tablet Take 1 tablet (5 mg total) by mouth every 4 (four) hours as needed for moderate pain (pain score 4-6). 04/16/23  McKenzie, Belvie CROME, MD  sulfamethoxazole -trimethoprim  (BACTRIM  DS) 800-160 MG tablet Take 1 tablet by mouth 2 (two) times daily. Patient not taking: Reported on 07/10/2023 05/07/23   Sherrilee Belvie CROME, MD    Family History Family History  Problem Relation Age of Onset   Stroke Mother    Alcohol abuse Father    Down syndrome Sister    Alcohol abuse Brother    Colon cancer Neg Hx    Ulcers Neg Hx     Social History Social History   Tobacco Use   Smoking status: Never   Smokeless tobacco: Former     Types: Chew    Quit date: 11/25/1978  Vaping Use   Vaping status: Never Used  Substance Use Topics   Alcohol use: No   Drug use: No     Allergies   Patient has no known allergies.   Review of Systems Review of Systems Per HPI  Physical Exam Triage Vital Signs ED Triage Vitals  Encounter Vitals Group     BP 12/25/23 1447 (!) 159/82     Girls Systolic BP Percentile --      Girls Diastolic BP Percentile --      Boys Systolic BP Percentile --      Boys Diastolic BP Percentile --      Pulse Rate 12/25/23 1447 63     Resp 12/25/23 1447 20     Temp 12/25/23 1447 97.8 F (36.6 C)     Temp Source 12/25/23 1447 Oral     SpO2 12/25/23 1447 96 %     Weight --      Height --      Head Circumference --      Peak Flow --      Pain Score 12/25/23 1446 2     Pain Loc --      Pain Education --      Exclude from Growth Chart --    No data found.  Updated Vital Signs BP (!) 159/82 (BP Location: Right Arm)   Pulse 63   Temp 97.8 F (36.6 C) (Oral)   Resp 20   SpO2 96%   Visual Acuity Right Eye Distance:   Left Eye Distance:   Bilateral Distance:    Right Eye Near:   Left Eye Near:    Bilateral Near:     Physical Exam Vitals and nursing note reviewed.  Constitutional:      Appearance: Normal appearance.  HENT:     Head: Atraumatic.  Eyes:     Extraocular Movements: Extraocular movements intact.     Conjunctiva/sclera: Conjunctivae normal.  Cardiovascular:     Rate and Rhythm: Normal rate and regular rhythm.  Pulmonary:     Effort: Pulmonary effort is normal. No respiratory distress.     Breath sounds: No wheezing or rales.  Abdominal:     General: Bowel sounds are normal. There is no distension.     Palpations: Abdomen is soft. There is no mass.     Tenderness: There is no abdominal tenderness. There is no right CVA tenderness, left CVA tenderness, guarding or rebound.  Musculoskeletal:        General: Tenderness present. No swelling or deformity. Normal range  of motion.     Cervical back: Normal range of motion and neck supple.     Comments: Very minimal tenderness to palpation to the left upper abdominal region with no distention or guarding  Skin:    General: Skin is warm and  dry.     Comments: Trace bruising to the left upper quadrant  Neurological:     Mental Status: Mental status is at baseline.     Motor: No weakness.     Gait: Gait normal.  Psychiatric:        Mood and Affect: Mood normal.        Thought Content: Thought content normal.        Judgment: Judgment normal.      UC Treatments / Results  Labs (all labs ordered are listed, but only abnormal results are displayed) Labs Reviewed - No data to display  EKG   Radiology No results found.  Procedures Procedures (including critical care time)  Medications Ordered in UC Medications - No data to display  Initial Impression / Assessment and Plan / UC Course  I have reviewed the triage vital signs and the nursing notes.  Pertinent labs & imaging results that were available during my care of the patient were reviewed by me and considered in my medical decision making (see chart for details).     Very mildly hypertensive in triage, otherwise vital signs within normal limits.  Exam very reassuring with no red flag findings but did discuss with patient that we are unable to check for internal injury as we do not have imaging resources for this.  Discussed that if symptoms significantly worsen at any time to go to the emergency department for further evaluation.  Heating pads, over-the-counter pain relievers recommended.  Return for worsening symptoms.  Final Clinical Impressions(s) / UC Diagnoses   Final diagnoses:  Abdominal pain, left upper quadrant  Contusion of abdominal wall, initial encounter     Discharge Instructions      Your exam is very reassuring today.  As discussed, we are unable to fully rule out internal injury as we do not have access to CT  scanners or MRI machines in the setting but I am reassured by your vitals and exam.  You may apply heating pads, take over-the-counter pain relievers as needed and go to the emergency department for significant worsening symptoms.    ED Prescriptions   None    PDMP not reviewed this encounter.   Stuart Vernell Norris, PA-C 12/25/23 1529

## 2024-01-07 ENCOUNTER — Ambulatory Visit (HOSPITAL_COMMUNITY)
Admission: RE | Admit: 2024-01-07 | Discharge: 2024-01-07 | Disposition: A | Discharge status: Home / Self Care | Payer: Medicare HMO | Source: Ambulatory Visit | Attending: Hematology | Admitting: Hematology

## 2024-01-07 ENCOUNTER — Inpatient Hospital Stay: Payer: Medicare HMO | Attending: Hematology

## 2024-01-07 DIAGNOSIS — Z85528 Personal history of other malignant neoplasm of kidney: Secondary | ICD-10-CM | POA: Diagnosis not present

## 2024-01-07 DIAGNOSIS — Z08 Encounter for follow-up examination after completed treatment for malignant neoplasm: Secondary | ICD-10-CM | POA: Diagnosis not present

## 2024-01-07 DIAGNOSIS — C182 Malignant neoplasm of ascending colon: Secondary | ICD-10-CM | POA: Insufficient documentation

## 2024-01-07 DIAGNOSIS — C641 Malignant neoplasm of right kidney, except renal pelvis: Secondary | ICD-10-CM | POA: Insufficient documentation

## 2024-01-07 DIAGNOSIS — Z85038 Personal history of other malignant neoplasm of large intestine: Secondary | ICD-10-CM | POA: Diagnosis not present

## 2024-01-07 LAB — COMPREHENSIVE METABOLIC PANEL WITH GFR
ALT: 17 U/L (ref 0–44)
AST: 19 U/L (ref 15–41)
Albumin: 4.5 g/dL (ref 3.5–5.0)
Alkaline Phosphatase: 64 U/L (ref 38–126)
Anion gap: 11 (ref 5–15)
BUN: 9 mg/dL (ref 8–23)
CO2: 20 mmol/L — ABNORMAL LOW (ref 22–32)
Calcium: 9.1 mg/dL (ref 8.9–10.3)
Chloride: 99 mmol/L (ref 98–111)
Creatinine, Ser: 1.11 mg/dL (ref 0.61–1.24)
GFR, Estimated: >60 mL/min (ref >60)
Glucose, Bld: 96 mg/dL (ref 70–99)
Potassium: 4.5 mmol/L (ref 3.5–5.1)
Sodium: 130 mmol/L — ABNORMAL LOW (ref 135–145)
Total Bilirubin: 1.3 mg/dL — ABNORMAL HIGH (ref 0.0–1.2)
Total Protein: 6.8 g/dL (ref 6.5–8.1)

## 2024-01-07 LAB — CBC WITH DIFFERENTIAL/PLATELET
Abs Immature Granulocytes: 0.02 K/uL (ref 0.00–0.07)
Basophils Absolute: 0.1 K/uL (ref 0.0–0.1)
Basophils Relative: 1 %
Eosinophils Absolute: 0.2 K/uL (ref 0.0–0.5)
Eosinophils Relative: 2 %
HCT: 44 % (ref 39.0–52.0)
Hemoglobin: 15.1 g/dL (ref 13.0–17.0)
Immature Granulocytes: 0 %
Lymphocytes Relative: 31 %
Lymphs Abs: 2.3 K/uL (ref 0.7–4.0)
MCH: 30.6 pg (ref 26.0–34.0)
MCHC: 34.3 g/dL (ref 30.0–36.0)
MCV: 89.1 fL (ref 80.0–100.0)
Monocytes Absolute: 0.6 K/uL (ref 0.1–1.0)
Monocytes Relative: 8 %
Neutro Abs: 4.4 K/uL (ref 1.7–7.7)
Neutrophils Relative %: 58 %
Platelets: 179 K/uL (ref 150–400)
RBC: 4.94 MIL/uL (ref 4.22–5.81)
RDW: 12.3 % (ref 11.5–15.5)
WBC: 7.6 K/uL (ref 4.0–10.5)
nRBC: 0 % (ref 0.0–0.2)

## 2024-01-07 MED ORDER — IOHEXOL 9 MG/ML PO SOLN
ORAL | Status: AC
  Filled 2024-01-07: qty 1000

## 2024-01-07 MED ORDER — IOHEXOL 9 MG/ML PO SOLN
500.0000 mL | ORAL | Status: AC
  Administered 2024-01-07: 500 mL via ORAL

## 2024-01-08 LAB — CEA: CEA: 1.4 ng/mL (ref 0.0–4.7)

## 2024-01-14 ENCOUNTER — Inpatient Hospital Stay: Payer: Medicare HMO | Attending: Hematology | Admitting: Hematology

## 2024-01-14 VITALS — BP 136/81 | HR 56 | Temp 97.7°F | Resp 18 | Wt 161.2 lb

## 2024-01-14 DIAGNOSIS — K219 Gastro-esophageal reflux disease without esophagitis: Secondary | ICD-10-CM | POA: Insufficient documentation

## 2024-01-14 DIAGNOSIS — C182 Malignant neoplasm of ascending colon: Secondary | ICD-10-CM | POA: Insufficient documentation

## 2024-01-14 DIAGNOSIS — Z79899 Other long term (current) drug therapy: Secondary | ICD-10-CM | POA: Insufficient documentation

## 2024-01-14 DIAGNOSIS — I10 Essential (primary) hypertension: Secondary | ICD-10-CM | POA: Diagnosis not present

## 2024-01-14 DIAGNOSIS — Z85528 Personal history of other malignant neoplasm of kidney: Secondary | ICD-10-CM | POA: Insufficient documentation

## 2024-01-14 DIAGNOSIS — Z87891 Personal history of nicotine dependence: Secondary | ICD-10-CM | POA: Diagnosis not present

## 2024-01-14 DIAGNOSIS — E785 Hyperlipidemia, unspecified: Secondary | ICD-10-CM | POA: Diagnosis not present

## 2024-01-14 DIAGNOSIS — I700 Atherosclerosis of aorta: Secondary | ICD-10-CM | POA: Diagnosis not present

## 2024-01-14 DIAGNOSIS — C641 Malignant neoplasm of right kidney, except renal pelvis: Secondary | ICD-10-CM

## 2024-01-14 DIAGNOSIS — Z905 Acquired absence of kidney: Secondary | ICD-10-CM | POA: Diagnosis not present

## 2024-01-14 NOTE — Progress Notes (Signed)
 [facility] [address], [place] [address]    Clinic Day:  01/14/24   Referring physician: [facility]  Patient Care Team: Vick Lurie, FNP as PCP - General (Family Medicine) Shaaron Lamar HERO, MD as Consulting Physician (Gastroenterology)   ASSESSMENT & PLAN:   Assessment: 1.  Stage II (T3N0) colon cancer: -Right hemicolectomy on 11/10/2017, MSI stable. -Colonoscopy on 07/28/2019 showed 5 mm polyp in the sigmoid colon consistent with tubular adenoma.  Exam otherwise normal.   2.  Social/family history: - He is a non-smoker but chewed tobacco and quit at age 84.  No family history of kidney cancer.  3.  Right kidney clear-cell RCC, stage I (T1b N0): - Right radical nephrectomy on 04/14/2023 by Dr. McKenzie - Pathology: 4.2 cm clear-cell RCC, grade 2, tumor limited to the kidney (T1b), all margins negative.   Plan: 1.  Stage II colon cancer: - He denies any change in bowel habits.  No bleeding per rectum or melena. - Reviewed labs from 01/07/2024: Normal LFTs.  CBC was normal.  CEA was 1.4. - We reviewed CT CAP from 01/07/2024: No signs of metastatic disease.   2.  Stage I (T1b N0) right kidney clear-cell RCC: - He denies any new onset pains. - We reviewed CT CAP images from 01/07/2024: 3.1 cm lobulated soft tissue density in the right nephrectomy bed adjacent to probable hepatic flexure.  This was not seen on the previous scan. - I have recommended MRI of the abdomen with and without contrast for further evaluation.  RTC after the MRI.  If it is consistent with local recurrence of RCC, recommend reaching out to Dr. Sherrilee.  Orders Placed This Encounter  Procedures   MR Abdomen W Wo Contrast    Standing Status:   Future    Expected Date:   01/21/2024    Expiration Date:   01/13/2025    If indicated for the ordered procedure, I authorize the administration of contrast media per Radiology protocol:   Yes    What is the patient's sedation  requirement?:   No Sedation    Does the patient have a pacemaker or implanted devices?:   No    Preferred imaging location?:   [facility] (table limit - 500lbs)    Release to patient:   Immediate     I,Helena R Teague,acting as a scribe for Alean Stands, MD.,have documented all relevant documentation on the behalf of Alean Stands, MD,as directed by  Alean Stands, MD while in the presence of Alean Stands, MD.  I, Alean Stands MD, have reviewed the above documentation for accuracy and completeness, and I agree with the above.     Alean Stands, MD   8/6/20251:02 PM  CHIEF COMPLAINT:   Diagnosis: stage II colon cancer and IDA    Cancer Staging  Clear cell renal cell carcinoma, right (HCC) Staging form: Kidney, AJCC 8th Edition - Clinical stage from 07/10/2023: Stage I (cT1b, cN0, cM0) - Signed by Stands Alean, MD on 07/10/2023  Colon cancer [facility]) Staging form: Colon and Rectum, AJCC 8th Edition - Clinical: Stage IIA (cT3, cN0, cM0) - Signed by Stands Alean, MD on 11/24/2017    Prior Therapy: 1. Right hemicolectomy on 11/10/2017. 2. Intermittent Feraheme  last on 12/08/2017.  Current Therapy:  surveillance    HISTORY OF PRESENT ILLNESS:   Oncology History  Colon cancer (HCC)  10/28/2017 Initial Diagnosis   Colon cancer (HCC)   11/24/2017 Cancer Staging  Staging form: Colon and Rectum, AJCC 8th Edition - Clinical: Stage IIA (cT3, cN0, cM0) - Signed by Rogers Hai, MD on 11/24/2017   Clear cell renal cell carcinoma, right (HCC)  07/10/2023 Initial Diagnosis   Clear cell renal cell carcinoma, right (HCC)   07/10/2023 Cancer Staging   Staging form: Kidney, AJCC 8th Edition - Clinical stage from 07/10/2023: Stage I (cT1b, cN0, cM0) - Signed by Rogers Hai, MD on 07/10/2023 Histopathologic type: Clear cell adenocarcinoma, NOS Stage prefix: Initial diagnosis Histologic grade (G): G2 Histologic  grading system: 4 grade system      INTERVAL HISTORY:   Kaiyan is a 68 y.o. male presenting to clinic today for follow up of stage II colon cancer and IDA. He was last seen by me on 07/10/2023.  Since his last visit, he underwent CT CAP on 01/07/2024 that found: Status post right nephrectomy and right hemicolectomy. 31 x 8 mm lobulated soft tissue density is noted posteriorly and superiorly in the right nephrectomy bed which is adjacent to probable hepatic flexure. This is concerning for possible recurrent neoplasm or malignancy, and further evaluation with MRI with and without gadolinium administration is recommended. Minimal coronary artery calcifications are noted. Aortic Atherosclerosis.  Today, he states that he is doing well overall. His appetite level is at 100%. His energy level is at 80%.   PAST MEDICAL HISTORY:   Past Medical History: Past Medical History:  Diagnosis Date   Anemia    Cancer (HCC)    Ascending colon s/p right hemicolectomy on 11/10/2017.    GERD (gastroesophageal reflux disease)    Hyperlipidemia    Hypertension    Lower back pain     Surgical History: Past Surgical History:  Procedure Laterality Date   BACK SURGERY     BIOPSY  10/17/2017   Procedure: BIOPSY;  Surgeon: Shaaron Lamar HERO, MD;  [facility];  Service: Endoscopy;;   COLON RESECTION N/A 11/10/2017   Procedure: LAPAROSCOPIC PARTIAL COLECTOMY;  Surgeon: Kallie Manuelita BROCKS, MD;  Location: [facility];  Service: General;  Laterality: N/A;   COLONOSCOPY N/A 10/17/2017   Procedure: COLONOSCOPY;  Surgeon: Shaaron Lamar HERO, MD; bulky apple core tumor in the vicinity of a sending colon.  Diverticulosis in sigmoid and descending colon.  Pathology with invasive adenocarcinoma.   COLONOSCOPY N/A 07/28/2019   Procedure: COLONOSCOPY;  Surgeon: Shaaron Lamar HERO, MD;  [facility];  Service: Endoscopy;  Laterality: N/A;  2:15pm   IR RADIOLOGIST EVAL & MGMT  01/08/2023   POLYPECTOMY  07/28/2019    Procedure: POLYPECTOMY;  Surgeon: Shaaron Lamar HERO, MD;  [facility];  Service: Endoscopy;;   ROBOT ASSISTED LAPAROSCOPIC NEPHRECTOMY Right 04/14/2023   Procedure: XI ROBOTIC ASSISTED LAPAROSCOPIC NEPHRECTOMY- radical nephrectomy;  Surgeon: Sherrilee Belvie CROME, MD;  Location: [facility];  Service: Urology;  Laterality: Right;    Social History: Social History   Socioeconomic History   Marital status: Single    Spouse name: Not on file   Number of children: Not on file   Years of education: Not on file   Highest education level: Not on file  Occupational History   Not on file  Tobacco Use   Smoking status: Never   Smokeless tobacco: Former    Types: Chew    Quit date: 11/25/1978  Vaping Use   Vaping status: Never Used  Substance and Sexual Activity   Alcohol use: No   Drug use: No   Sexual activity: Not on file  Other Topics Concern   Not on file  Social History Narrative   Not on file   Social Drivers of Health   Financial Resource Strain: Not on file  Food Insecurity: No Food Insecurity (04/14/2023)   Hunger Vital Sign    Worried About Running Out of Food in the Last Year: Never true    Ran Out of Food in the Last Year: Never true  Transportation Needs: No Transportation Needs (04/14/2023)   PRAPARE - Administrator, Civil Service (Medical): No    Lack of Transportation (Non-Medical): No  Physical Activity: Not on file  Stress: Not on file  Social Connections: Not on file  Intimate Partner Violence: Not At Risk (04/14/2023)   Humiliation, Afraid, Rape, and Kick questionnaire    Fear of Current or Ex-Partner: No    Emotionally Abused: No    Physically Abused: No    Sexually Abused: No    Family History: Family History  Problem Relation Age of Onset   Stroke Mother    Alcohol abuse Father    Down syndrome Sister    Alcohol abuse Brother    Colon cancer Neg Hx    Ulcers Neg Hx     Current Medications:  Current Outpatient Medications:     atorvastatin  (LIPITOR) 20 MG tablet, Take 20 mg by mouth daily., Disp: , Rfl:    diphenhydrAMINE  (BENADRYL ) 25 MG tablet, Take 25 mg by mouth every 6 (six) hours as needed for allergies., Disp: , Rfl:    lisinopril  (ZESTRIL ) 10 MG tablet, Take 10 mg by mouth daily., Disp: , Rfl:    Allergies: Allergies  Allergen Reactions   Rosuvastatin Other (See Comments)    rosuvastatin    REVIEW OF SYSTEMS:   Review of Systems  Constitutional:  Negative for chills, fatigue and fever.  HENT:   Negative for lump/mass, mouth sores, nosebleeds, sore throat and trouble swallowing.   Eyes:  Negative for eye problems.  Respiratory:  Negative for cough and shortness of breath.   Cardiovascular:  Negative for chest pain, leg swelling and palpitations.  Gastrointestinal:  Negative for abdominal pain, constipation, diarrhea, nausea and vomiting.  Genitourinary:  Negative for bladder incontinence, difficulty urinating, dysuria, frequency, hematuria and nocturia.   Musculoskeletal:  Negative for arthralgias, back pain, flank pain, myalgias and neck pain.  Skin:  Negative for itching and rash.  Neurological:  Positive for numbness. Negative for dizziness and headaches.  Hematological:  Does not bruise/bleed easily.  Psychiatric/Behavioral:  Negative for depression, sleep disturbance and suicidal ideas. The patient is not nervous/anxious.   All other systems reviewed and are negative.    VITALS:   Blood pressure 136/81, pulse (!) 56, temperature 97.7 F (36.5 C), temperature source Oral, resp. rate 18, weight 161 lb 3.2 oz (73.1 kg), SpO2 100%.  Wt Readings from Last 3 Encounters:  01/14/24 161 lb 3.2 oz (73.1 kg)  07/10/23 150 lb 5.7 oz (68.2 kg)  04/10/23 175 lb 14.8 oz (79.8 kg)    Body mass index is 26.02 kg/m.  Performance status (ECOG): 1 - Symptomatic but completely ambulatory  PHYSICAL EXAM:   Physical Exam Vitals and nursing note reviewed. Exam conducted with a chaperone present.   Constitutional:      Appearance: Normal appearance.  Cardiovascular:     Rate and Rhythm: Normal rate and regular rhythm.     Pulses: Normal pulses.     Heart sounds: Normal heart sounds.  Pulmonary:     Effort: Pulmonary  effort is normal.     Breath sounds: Normal breath sounds.  Abdominal:     Palpations: Abdomen is soft. There is no hepatomegaly, splenomegaly or mass.     Tenderness: There is no abdominal tenderness.  Musculoskeletal:     Right lower leg: No edema.     Left lower leg: No edema.  Lymphadenopathy:     Cervical: No cervical adenopathy.     Right cervical: No superficial, deep or posterior cervical adenopathy.    Left cervical: No superficial, deep or posterior cervical adenopathy.     Upper Body:     Right upper body: No supraclavicular or axillary adenopathy.     Left upper body: No supraclavicular or axillary adenopathy.  Neurological:     General: No focal deficit present.     Mental Status: He is alert and oriented to person, place, and time.  Psychiatric:        Mood and Affect: Mood normal.        Behavior: Behavior normal.     LABS:      Latest Ref Rng & Units 01/07/2024   11:13 AM 07/03/2023   10:42 AM 04/15/2023    4:35 AM  CBC  WBC 4.0 - 10.5 K/uL 7.6  7.3  12.7   Hemoglobin 13.0 - 17.0 g/dL 84.8  86.0  86.4   Hematocrit 39.0 - 52.0 % 44.0  40.9  40.4   Platelets 150 - 400 K/uL 179  198  182       Latest Ref Rng & Units 01/07/2024   11:13 AM 07/24/2023    3:13 PM 07/03/2023   10:42 AM  CMP  Glucose 70 - 99 mg/dL 96  87  897   BUN 8 - 23 mg/dL 9  9  9    Creatinine 0.61 - 1.24 mg/dL 8.88  8.84  9.04   Sodium 135 - 145 mmol/L 130  131  124   Potassium 3.5 - 5.1 mmol/L 4.5  4.4  4.3   Chloride 98 - 111 mmol/L 99  96  94   CO2 22 - 32 mmol/L 20  20  23    Calcium  8.9 - 10.3 mg/dL 9.1  9.1  8.9   Total Protein 6.5 - 8.1 g/dL 6.8  6.1  6.4   Total Bilirubin 0.0 - 1.2 mg/dL 1.3  0.5  0.6   Alkaline Phos 38 - 126 U/L 64  96  74   AST 15 - 41  U/L 19  22  19    ALT 0 - 44 U/L 17  23  19       Lab Results  Component Value Date   CEA1 1.4 01/07/2024   /  CEA  Date Value Ref Range Status  01/07/2024 1.4 0.0 - 4.7 ng/mL Final    Comment:    (NOTE)                             Nonsmokers          <3.9                             Smokers             <5.6 Roche [employer] Electrochemiluminescence Immunoassay (ECLIA) Values obtained with different assay methods or kits cannot be used interchangeably.  Results cannot be interpreted as absolute evidence of the presence or absence of malignant disease.  Performed At: [facility] [address] [place], [place] [id number] Jennette Shorter MD Ey:[phone]    No results found for: PSA1 No results found for: RJW800 No results found for: CAN125  No results found for: TOTALPROTELP, ALBUMINELP, A1GS, A2GS, BETS, BETA2SER, GAMS, MSPIKE, SPEI Lab Results  Component Value Date   TIBC 493 (H) 11/24/2017   FERRITIN 113 01/27/2019   FERRITIN 92 10/27/2018   FERRITIN 56 04/27/2018   IRONPCTSAT 3 (L) 11/24/2017   Lab Results  Component Value Date   LDH 142 01/27/2019   LDH 155 10/27/2018   LDH 149 04/27/2018     STUDIES:   CT CHEST ABDOMEN PELVIS WO CONTRAST Result Date: 01/13/2024 CLINICAL DATA:  History of renal cancer. Status post hemicolectomy for colon cancer. EXAM: CT CHEST, ABDOMEN AND PELVIS WITHOUT CONTRAST TECHNIQUE: Multidetector CT imaging of the chest, abdomen and pelvis was performed following the standard protocol without IV contrast. RADIATION DOSE REDUCTION: This exam was performed according to the departmental dose-optimization program which includes automated exposure control, adjustment of the mA and/or kV according to patient size and/or use of iterative reconstruction technique. COMPARISON:  September 16, 2022. FINDINGS: CT CHEST FINDINGS Cardiovascular: Normal cardiac size. No pericardial effusion. Minimal coronary artery calcifications  are noted. Mediastinum/Nodes: No enlarged mediastinal, hilar, or axillary lymph nodes. Thyroid gland, trachea, and esophagus demonstrate no significant findings. However, evaluation is limited due to lack of intravenous contrast. Lungs/Pleura: Lungs are clear. No pleural effusion or pneumothorax. Musculoskeletal: No chest wall mass or suspicious bone lesions identified. CT ABDOMEN PELVIS FINDINGS Hepatobiliary: No focal liver abnormality is seen on these unenhanced images. No gallstones, gallbladder wall thickening, or biliary dilatation. Pancreas: Unremarkable. No pancreatic ductal dilatation or surrounding inflammatory changes. Spleen: Normal in size without focal abnormality. Adrenals/Urinary Tract: Adrenal glands appear normal. Left kidney and ureter are unremarkable. Urinary bladder is unremarkable. Status post right nephrectomy. 31 x 8 mm lobulated soft tissue density is noted posteriorly and superiorly in the right nephrectomy bed which is adjacent to hepatic flexure. The possibility of this representing recurrent malignancy, whether colonic or renal in origin, cannot be excluded. Further evaluation with MRI is recommended. Stomach/Bowel: The stomach is unremarkable. Status post right hemicolectomy. There is no evidence of bowel obstruction or inflammation. Vascular/Lymphatic: Aortic atherosclerosis. No enlarged abdominal or pelvic lymph nodes. Reproductive: Prostate is unremarkable. Other: No ascites or hernia is noted. Musculoskeletal: No acute or significant osseous findings. IMPRESSION: Status post right nephrectomy and right hemicolectomy. 31 x 8 mm lobulated soft tissue density is noted posteriorly and superiorly in the right nephrectomy bed which is adjacent to probable hepatic flexure. This is concerning for possible recurrent neoplasm or malignancy, and further evaluation with MRI with and without gadolinium administration is recommended. Minimal coronary artery calcifications are noted. Aortic  Atherosclerosis (ICD10-I70.0). Electronically Signed   By: Lynwood Landy Raddle M.D.   On: 01/13/2024 10:22

## 2024-01-14 NOTE — Patient Instructions (Signed)
 [facility]  Discharge Instructions  You were seen and examined today by Dr. Rogers.  Dr. Rogers discussed your most recent lab work and CT scan which revealed that there is a small area of haziness that the radiologist is requesting an MRI to be done to see what it is. Everything else looks good.  Dr Rogers is ordering the MRI.   Follow-up as scheduled.    Thank you for choosing  [facility] to provide your oncology and hematology care.   To afford each patient quality time with our provider, please arrive at least 15 minutes before your scheduled appointment time. You may need to reschedule your appointment if you arrive late (10 or more minutes). Arriving late affects you and other patients whose appointments are after yours.  Also, if you miss three or more appointments without notifying the office, you may be dismissed from the clinic at the provider's discretion.    Again, thank you for choosing [facility].  Our hope is that these requests will decrease the amount of time that you wait before being seen by our physicians.   If you have a lab appointment with the Cancer Center - please note that after April 8th, all labs will be drawn in the cancer center.  You do not have to check in or register with the main entrance as you have in the past but will complete your check-in at the cancer center.            _____________________________________________________________  Should you have questions after your visit to [facility], please contact our office at [phone] and follow the prompts.  Our office hours are 8:00 a.m. to 4:30 p.m. Monday - Thursday and 8:00 a.m. to 2:30 p.m. Friday.  Please note that voicemails left after 4:00 p.m. may not be returned until the following business day.  We are closed weekends and all major holidays.  You do have access to a nurse 24-7, just call the main  number to the clinic [phone] and do not press any options, hold on the line and a nurse will answer the phone.    For prescription refill requests, have your pharmacy contact our office and allow 72 hours.    Masks are no longer required in the cancer centers. If you would like for your care team to wear a mask while they are taking care of you, please let them know. You may have one support person who is at least 69 years old accompany you for your appointments.

## 2024-01-21 ENCOUNTER — Other Ambulatory Visit: Payer: Medicare HMO

## 2024-01-21 DIAGNOSIS — C649 Malignant neoplasm of unspecified kidney, except renal pelvis: Secondary | ICD-10-CM

## 2024-01-22 LAB — COMPREHENSIVE METABOLIC PANEL WITH GFR
ALT: 15 IU/L (ref 0–44)
AST: 20 IU/L (ref 0–40)
Albumin: 4.6 g/dL (ref 3.9–4.9)
Alkaline Phosphatase: 80 IU/L (ref 44–121)
BUN/Creatinine Ratio: 10 (ref 10–24)
BUN: 12 mg/dL (ref 8–27)
Bilirubin Total: 0.8 mg/dL (ref 0.0–1.2)
CO2: 19 mmol/L — ABNORMAL LOW (ref 20–29)
Calcium: 9.3 mg/dL (ref 8.6–10.2)
Chloride: 94 mmol/L — ABNORMAL LOW (ref 96–106)
Creatinine, Ser: 1.18 mg/dL (ref 0.76–1.27)
Globulin, Total: 2 g/dL (ref 1.5–4.5)
Glucose: 86 mg/dL (ref 70–99)
Potassium: 5.2 mmol/L (ref 3.5–5.2)
Sodium: 129 mmol/L — ABNORMAL LOW (ref 134–144)
Total Protein: 6.6 g/dL (ref 6.0–8.5)
eGFR: 67 mL/min/1.73 (ref >59)

## 2024-01-26 ENCOUNTER — Other Ambulatory Visit: Payer: Self-pay

## 2024-01-26 ENCOUNTER — Ambulatory Visit (HOSPITAL_COMMUNITY)
Admission: RE | Admit: 2024-01-26 | Discharge: 2024-01-26 | Disposition: A | Discharge status: Home / Self Care | Source: Ambulatory Visit | Attending: Hematology | Admitting: Hematology

## 2024-01-26 DIAGNOSIS — C641 Malignant neoplasm of right kidney, except renal pelvis: Secondary | ICD-10-CM | POA: Insufficient documentation

## 2024-01-26 DIAGNOSIS — C182 Malignant neoplasm of ascending colon: Secondary | ICD-10-CM | POA: Insufficient documentation

## 2024-01-26 DIAGNOSIS — C649 Malignant neoplasm of unspecified kidney, except renal pelvis: Secondary | ICD-10-CM

## 2024-01-26 MED ORDER — GADOBUTROL 1 MMOL/ML IV SOLN
7.0000 mL | Freq: Once | INTRAVENOUS | Status: AC | PRN
Start: 2024-01-26 — End: 2024-01-26
  Administered 2024-01-26: 7 mL via INTRAVENOUS

## 2024-01-27 ENCOUNTER — Ambulatory Visit: Payer: Self-pay | Admitting: Urology

## 2024-01-27 ENCOUNTER — Ambulatory Visit (HOSPITAL_COMMUNITY)
Admission: RE | Admit: 2024-01-27 | Discharge: 2024-01-27 | Disposition: A | Discharge status: Home / Self Care | Source: Ambulatory Visit | Attending: Urology | Admitting: Urology

## 2024-01-27 DIAGNOSIS — C649 Malignant neoplasm of unspecified kidney, except renal pelvis: Secondary | ICD-10-CM | POA: Insufficient documentation

## 2024-01-28 ENCOUNTER — Encounter: Payer: Self-pay | Admitting: Urology

## 2024-01-28 ENCOUNTER — Ambulatory Visit: Payer: Medicare HMO | Admitting: Urology

## 2024-01-28 VITALS — BP 162/73 | HR 58

## 2024-01-28 DIAGNOSIS — C641 Malignant neoplasm of right kidney, except renal pelvis: Secondary | ICD-10-CM

## 2024-01-28 DIAGNOSIS — C649 Malignant neoplasm of unspecified kidney, except renal pelvis: Secondary | ICD-10-CM

## 2024-01-28 LAB — URINALYSIS, ROUTINE W REFLEX MICROSCOPIC
Bilirubin, UA: NEGATIVE
Glucose, UA: NEGATIVE
Ketones, UA: NEGATIVE
Leukocytes,UA: NEGATIVE
Nitrite, UA: NEGATIVE
Protein,UA: NEGATIVE
RBC, UA: NEGATIVE
Specific Gravity, UA: 1.01 (ref 1.005–1.030)
Urobilinogen, Ur: 0.2 mg/dL (ref 0.2–1.0)
pH, UA: 6 (ref 5.0–7.5)

## 2024-01-28 NOTE — Progress Notes (Signed)
 01/28/2024 9:59 AM   Jack Gibbs [birth year] [id number]  Referring provider: Vick Lurie, FNP [address] [place],  [place] [address]  Right RCC   HPI: Mr Dunlow is a [phone] here for followup for right RCC. CXR normal. Creatinine 1.18. He underwent CT and there was concern for soft tissue mass in the nephrectomy bed. MRI from 8/18 shows area of concern was post surgical changes and not recurrence of RCC   PMH: Past Medical History:  Diagnosis Date   Anemia    Cancer (HCC)    Ascending colon s/p right hemicolectomy on 11/10/2017.    GERD (gastroesophageal reflux disease)    Hyperlipidemia    Hypertension    Lower back pain     Surgical History: Past Surgical History:  Procedure Laterality Date   BACK SURGERY     BIOPSY  10/17/2017   Procedure: BIOPSY;  Surgeon: Shaaron Lamar HERO, MD;  [facility];  Service: Endoscopy;;   COLON RESECTION N/A 11/10/2017   Procedure: LAPAROSCOPIC PARTIAL COLECTOMY;  Surgeon: Kallie Manuelita BROCKS, MD;  Location: [facility];  Service: General;  Laterality: N/A;   COLONOSCOPY N/A 10/17/2017   Procedure: COLONOSCOPY;  Surgeon: Shaaron Lamar HERO, MD; bulky apple core tumor in the vicinity of a sending colon.  Diverticulosis in sigmoid and descending colon.  Pathology with invasive adenocarcinoma.   COLONOSCOPY N/A 07/28/2019   Procedure: COLONOSCOPY;  Surgeon: Shaaron Lamar HERO, MD;  [facility];  Service: Endoscopy;  Laterality: N/A;  2:15pm   IR RADIOLOGIST EVAL & MGMT  01/08/2023   POLYPECTOMY  07/28/2019   Procedure: POLYPECTOMY;  Surgeon: Shaaron Lamar HERO, MD;  [facility];  Service: Endoscopy;;   ROBOT ASSISTED LAPAROSCOPIC NEPHRECTOMY Right 04/14/2023   Procedure: XI ROBOTIC ASSISTED LAPAROSCOPIC NEPHRECTOMY- radical nephrectomy;  Surgeon: Sherrilee Belvie CROME, MD;  Location[facility];  Service: Urology;  Laterality: Right;    Home Medications:  Allergies as of 01/28/2024       Reactions    Rosuvastatin Other (See Comments)   rosuvastatin        Medication List        Accurate as of January 28, 2024  9:59 AM. If you have any questions, ask your nurse or doctor.          atorvastatin  20 MG tablet Commonly known as: LIPITOR Take 20 mg by mouth daily.   diphenhydrAMINE  25 MG tablet Commonly known as: BENADRYL  Take 25 mg by mouth every 6 (six) hours as needed for allergies.   lisinopril  10 MG tablet Commonly known as: ZESTRIL  Take 10 mg by mouth daily.        Allergies:  Allergies  Allergen Reactions   Rosuvastatin Other (See Comments)    rosuvastatin    Family History: Family History  Problem Relation Age of Onset   Stroke Mother    Alcohol abuse Father    Down syndrome Sister    Alcohol abuse Brother    Colon cancer Neg Hx    Ulcers Neg Hx     Social History:  reports that he has never smoked. He quit smokeless tobacco use about 45 years ago.  His smokeless tobacco use included chew. He reports that he does not drink alcohol and does not use drugs.  ROS: All other review of systems were reviewed and are negative except what is noted above in HPI  Physical Exam: BP (!) 162/73   Pulse (!) 58   Constitutional:  Alert and oriented, No acute distress. HEENT: [place] AT, moist mucus membranes.  Trachea midline, no masses. Cardiovascular: No clubbing, cyanosis, or edema. Respiratory: Normal respiratory effort, no increased work of breathing. GI: Abdomen is soft, nontender, nondistended, no abdominal masses GU: No CVA tenderness.  Lymph: No cervical or inguinal lymphadenopathy. Skin: No rashes, bruises or suspicious lesions. Neurologic: Grossly intact, no focal deficits, moving all 4 extremities. Psychiatric: Normal mood and affect.  Laboratory Data: Lab Results  Component Value Date   WBC 7.6 01/07/2024   HGB 15.1 01/07/2024   HCT 44.0 01/07/2024   MCV 89.1 01/07/2024   PLT 179 01/07/2024    Lab Results  Component Value Date   CREATININE  1.18 01/21/2024    No results found for: PSA  No results found for: TESTOSTERONE  No results found for: HGBA1C  Urinalysis    Component Value Date/Time   APPEARANCEUR Clear 04/21/2023 0845   GLUCOSEU Negative 04/21/2023 0845   BILIRUBINUR Negative 04/21/2023 0845   PROTEINUR Negative 04/21/2023 0845   NITRITE Negative 04/21/2023 0845   LEUKOCYTESUR Negative 04/21/2023 0845    Lab Results  Component Value Date   LABMICR Comment 04/21/2023   WBCUA 0-5 11/22/2022   LABEPIT 0-10 11/22/2022   MUCUS None seen 09/27/2022   BACTERIA None seen 11/22/2022    Pertinent Imaging: MR 01/26/2024: Images reviewed and discussed with the patient  No results found for this or any previous visit.  No results found for this or any previous visit.  No results found for this or any previous visit.  No results found for this or any previous visit.  Results for orders placed during the hospital encounter of 09/23/22  Ultrasound renal complete  Narrative CLINICAL DATA:  Renal mass  EXAM: RENAL / URINARY TRACT ULTRASOUND COMPLETE  COMPARISON:  CT abdomen pelvis 09/16/2022; renal ultrasound 09/19/2021  FINDINGS: Right Kidney:  Renal measurements: 11.1 x 5.3 x 4.8 cm = volume: 148.2 mL. Normal renal cortical thickness and echogenicity. No hydronephrosis. There is a 4.4 x 2.4 x 3.2 cm mixed echogenicity mass midpole right kidney, previously measuring 4.1 x 3.0 cm on prior CT.  Left Kidney:  Renal measurements: 11.8 x 5.8 x 5.3 cm = volume: 190.7 mL. Echogenicity within normal limits. No mass or hydronephrosis visualized.  Bladder:  Appears normal for degree of bladder distention.  Other:  None.  IMPRESSION: Mixed echogenicity mass midpole right kidney measures up to 4.4 cm, previously measuring up to 4.1 cm on CT from 09/16/2022. This remains concerning for renal cell carcinoma.   Electronically Signed By: Bard Moats M.D. On: 09/23/2022 14:28  No results  found for this or any previous visit.  No results found for this or any previous visit.  No results found for this or any previous visit.   Assessment & Plan:    1. Malignant neoplasm of kidney, unspecified laterality (HCC) (Primary) -followup 6 months with CMP and CXR - Urinalysis, Routine w reflex microscopic   No follow-ups on file.  Belvie Clara, MD  [facility] [place]

## 2024-01-28 NOTE — Patient Instructions (Signed)
 Kidney Cancer  Kidney cancer is a type of cancer in which an abnormal growth of cells (tumor) forms in one or both kidneys. The kidneys filter waste from your blood and make urine. Kidney cancer may spread to other parts of your body. This type of cancer may also be called renal cell carcinoma. What are the causes? The cause of this condition is not known. What increases the risk? You may be more likely to develop kidney cancer if: You are over age 69. You have certain conditions that are passed from parent to child (inherited). These include von Hippel-Lindau disease, tuberous sclerosis, and hereditary papillary renal carcinoma. You smoke or have been exposed to certain chemicals. You have advanced kidney disease, especially if you need to use a machine to clean your blood (dialysis). You are obese. You have high blood pressure (hypertension). You are male. What are the signs or symptoms? At first, kidney cancer may not cause any symptoms. As the cancer grows, symptoms may include: Blood in the urine. Pain in the upper back or abdomen, just below the rib cage. You may feel pain on one or both sides of your body. Tiredness (fatigue). Weight loss that cannot be explained. Fever. How is this diagnosed? This condition may be diagnosed based on: Your symptoms. Your medical history. A physical exam. You may also have tests, such as: Blood and urine tests. X-rays. Imaging tests, such as CT scans, MRIs, and PET scans. Angiogram. This is when dye is injected into your blood to show your blood vessels. Intravenous pyelogram. This is when dye is injected into your blood to show your kidneys and the other organs that help with making and storing urine. Biopsy. This is when a piece of tissue is removed from your kidney and looked at under a microscope. Your cancer will be assessed (staged), based on how severe it is and how much it has spread. How is this treated? Treatment depends on the type  and stage of the cancer. Treatment may include: Surgery to remove: Just the tumor (nephron-sparing surgery). The entire kidney (nephrectomy). The kidney, some of the healthy tissue around it, nearby lymph nodes, and sometimes the adrenal gland (radical nephrectomy). Medicines to: Kill cancer cells (chemotherapy). Help your body's disease-fighting system (immune system) fight cancer cells. This is known as immunotherapy. Radiation therapy. This uses high-energy rays to kill cancer cells. Targeted therapy to only attack genes and proteins that allow a tumor to grow. This type of therapy tries to limit damage to your healthy cells. Cryoablation. This uses gas or liquid to freeze cancer cells. It is sent through a needle. Radiofrequency ablation. This uses high-energy radio waves to destroy cancer cells. It is sent through a needle-like probe. Embolization. This is a procedure to block the artery that supplies blood to the tumor. Follow these instructions at home: Eating and drinking Some of your treatments may affect your appetite and your ability to chew and swallow. If you have problems eating, or if you do not have an appetite, meet with an expert in diet and nutrition (dietitian). If you have side effects that affect eating, it may help to: Eat smaller meals more often. Eat bland or soft foods. Avoid foods that are hot, spicy, or hard to swallow. Drink shakes or supplements that are high in nutrition and calories. Do not drink alcohol. Lifestyle Do not use any products that contain nicotine or tobacco. These products include cigarettes, chewing tobacco, and vaping devices, such as e-cigarettes. If you need  help quitting, ask your health care provider. Get enough sleep. Most adults need 6-8 hours of sleep each night. During treatment, you may need more sleep. General instructions Take over-the-counter and prescription medicines only as told by your health care provider. Consider joining a  support group. This can help you cope with the stress of having kidney cancer. Work with your health care provider to manage any side effects of treatment. Keep all follow-up visits. Your health care provider will want to make sure your treatment is working. Where to find more information American Cancer Society: cancer.org [employer] ([employer]): cancer.gov Contact a health care provider if: You bruise or bleed easily. You lose weight without trying. You have new or worse fatigue or weakness. You have a fever. Your pain suddenly increases. You have more blood in your urine. Your skin or the white parts of your eyes turn yellow (jaundice). Get help right away if: You have chest pain. You are short of breath. You have irregular heartbeats (palpitations). These symptoms may be an emergency. Get help right away. Call 911. Do not wait to see if the symptoms will go away. Do not drive yourself to the hospital. This information is not intended to replace advice given to you by your health care provider. Make sure you discuss any questions you have with your health care provider. Document Revised: 11/06/2021 Document Reviewed: 11/06/2021 Elsevier Patient Education  2024 [employer].

## 2024-02-05 ENCOUNTER — Inpatient Hospital Stay (HOSPITAL_BASED_OUTPATIENT_CLINIC_OR_DEPARTMENT_OTHER): Admitting: Oncology

## 2024-02-05 VITALS — BP 140/86 | HR 56 | Temp 97.7°F | Resp 16 | Wt 159.6 lb

## 2024-02-05 DIAGNOSIS — C182 Malignant neoplasm of ascending colon: Secondary | ICD-10-CM | POA: Diagnosis not present

## 2024-02-05 DIAGNOSIS — C641 Malignant neoplasm of right kidney, except renal pelvis: Secondary | ICD-10-CM | POA: Diagnosis not present

## 2024-02-05 NOTE — Assessment & Plan Note (Addendum)
-   He denies any change in bowel habits.  No bleeding per rectum or melena. - Reviewed labs from 01/07/2024: Normal LFTs.  CBC was normal.  CEA was 1.4. - We reviewed CT CAP from 01/07/2024: No signs of metastatic disease.

## 2024-02-05 NOTE — Assessment & Plan Note (Addendum)
-   He denies any new onset pains. - We reviewed CT CAP images from 01/07/2024: 3.1 cm lobulated soft tissue density in the right nephrectomy bed adjacent to probable hepatic flexure.  This was not seen on the previous scan. - Patient had MRI on 01/26/2024 that showed a lobulated soft tissue density seen on CT scan were favored to reflect postsurgical change.  There was no evidence of metastatic disease in the abdomen. -This was discussed with the patient and continued surveillance is recommended. -Will repeat his CT scan in 6 months with lab work and to see oncology shortly after.

## 2024-02-05 NOTE — Progress Notes (Signed)
 [facility] OFFICE PROGRESS NOTE  Jack Lurie, FNP  ASSESSMENT & PLAN:    Assessment & Plan Clear cell renal cell carcinoma, right (HCC) - He denies any new onset pains. - We reviewed CT CAP images from 01/07/2024: 3.1 cm lobulated soft tissue density in the right nephrectomy bed adjacent to probable hepatic flexure.  This was not seen on the previous scan. - Patient had MRI on 01/26/2024 that showed a lobulated soft tissue density seen on CT scan were favored to reflect postsurgical change.  There was no evidence of metastatic disease in the abdomen. -This was discussed with the patient and continued surveillance is recommended. -Will repeat his CT scan in 6 months with lab work and to see oncology shortly after. Malignant neoplasm of ascending colon (HCC) - He denies any change in bowel habits.  No bleeding per rectum or melena. - Reviewed labs from 01/07/2024: Normal LFTs.  CBC was normal.  CEA was 1.4. - We reviewed CT CAP from 01/07/2024: No signs of metastatic disease.  Orders Placed This Encounter  Procedures   CT CHEST ABDOMEN PELVIS WO CONTRAST    Standing Status:   Future    Expected Date:   08/04/2024    Expiration Date:   02/04/2025    Preferred imaging location?:   [facility]    If indicated for the ordered procedure, I authorize the administration of oral contrast media per Radiology protocol:   Yes    Does the patient have a contrast media/X-ray dye allergy?:   No   CBC with Differential    Standing Status:   Future    Expected Date:   08/07/2024    Expiration Date:   02/04/2025   Comprehensive metabolic panel    Standing Status:   Future    Expected Date:   08/07/2024    Expiration Date:   02/04/2025   CEA    Standing Status:   Future    Expected Date:   08/07/2024    Expiration Date:   02/04/2025    INTERVAL HISTORY: Patient returns for follow-up for renal cell carcinoma and colon cancer.  He is followed closely by urology Dr. Sherrilee  and recently had a CT scan which showed some concern for soft tissue mass in the nephrectomy bed.  MRI from 01/26/2024 shows area of concern was postsurgical changes and not recurrence of RCC.  He is here just to review most recent scans and make sure there is no evidence of recurrence.  Overall, he reports doing well.  We reviewed MRI from 01/26/2024.  SUMMARY OF HEMATOLOGIC HISTORY: Oncology History  Colon cancer (HCC)  10/28/2017 Initial Diagnosis   Colon cancer (HCC)   11/24/2017 Cancer Staging   Staging form: Colon and Rectum, AJCC 8th Edition - Clinical: Stage IIA (cT3, cN0, cM0) - Signed by Rogers Hai, MD on 11/24/2017   Clear cell renal cell carcinoma, right (HCC)  07/10/2023 Initial Diagnosis   Clear cell renal cell carcinoma, right (HCC)   07/10/2023 Cancer Staging   Staging form: Kidney, AJCC 8th Edition - Clinical stage from 07/10/2023: Stage I (cT1b, cN0, cM0) - Signed by Rogers Hai, MD on 07/10/2023 Histopathologic type: Clear cell adenocarcinoma, NOS Stage prefix: Initial diagnosis Histologic grade (G): G2 Histologic grading system: 4 grade system    1.  Stage II (T3N0) colon cancer: -Right hemicolectomy on 11/10/2017, MSI stable. -Colonoscopy on 07/28/2019 showed 5 mm polyp in the sigmoid colon consistent with tubular adenoma.  Exam otherwise  normal.   2.  Social/family history: - He is a non-smoker but chewed tobacco and quit at age 82.  No family history of kidney cancer.   3.  Right kidney clear-cell RCC, stage I (T1b N0): - Right radical nephrectomy on 04/14/2023 by Dr. McKenzie - Pathology: 4.2 cm clear-cell RCC, grade 2, tumor limited to the kidney (T1b), all margins negative.  CBC    Component Value Date/Time   WBC 7.6 01/07/2024 1113   RBC 4.94 01/07/2024 1113   HGB 15.1 01/07/2024 1113   HCT 44.0 01/07/2024 1113   PLT 179 01/07/2024 1113   MCV 89.1 01/07/2024 1113   MCH 30.6 01/07/2024 1113   MCHC 34.3 01/07/2024 1113   RDW 12.3  01/07/2024 1113   LYMPHSABS 2.3 01/07/2024 1113   MONOABS 0.6 01/07/2024 1113   EOSABS 0.2 01/07/2024 1113   BASOSABS 0.1 01/07/2024 1113       Latest Ref Rng & Units 01/21/2024    9:25 AM 01/07/2024   11:13 AM 07/24/2023    3:13 PM  CMP  Glucose 70 - 99 mg/dL 86  96  87   BUN 8 - 27 mg/dL 12  9  9    Creatinine 0.76 - 1.27 mg/dL 8.81  8.88  8.84   Sodium 134 - 144 mmol/L 129  130  131   Potassium 3.5 - 5.2 mmol/L 5.2  4.5  4.4   Chloride 96 - 106 mmol/L 94  99  96   CO2 20 - 29 mmol/L 19  20  20    Calcium  8.6 - 10.2 mg/dL 9.3  9.1  9.1   Total Protein 6.0 - 8.5 g/dL 6.6  6.8  6.1   Total Bilirubin 0.0 - 1.2 mg/dL 0.8  1.3  0.5   Alkaline Phos 44 - 121 IU/L 80  64  96   AST 0 - 40 IU/L 20  19  22    ALT 0 - 44 IU/L 15  17  23       Lab Results  Component Value Date   FERRITIN 113 01/27/2019   VITAMINB12 271 11/24/2017    Vitals:   02/05/24 1024  BP: (!) 140/86  Pulse: (!) 56  Resp: 16  Temp: 97.7 F (36.5 C)  SpO2: 100%    Review of System:  Review of Systems  Respiratory:  Positive for shortness of breath.   Neurological:  Positive for headaches.    Physical Exam: Physical Exam Constitutional:      Appearance: Normal appearance.  HENT:     Head: Normocephalic and atraumatic.  Eyes:     Pupils: Pupils are equal, round, and reactive to light.  Cardiovascular:     Rate and Rhythm: Normal rate and regular rhythm.     Heart sounds: Normal heart sounds. No murmur heard. Pulmonary:     Effort: Pulmonary effort is normal.     Breath sounds: Normal breath sounds. No wheezing.  Abdominal:     General: Bowel sounds are normal. There is no distension.     Palpations: Abdomen is soft.     Tenderness: There is no abdominal tenderness.  Musculoskeletal:        General: Normal range of motion.     Cervical back: Normal range of motion.  Skin:    General: Skin is warm and dry.     Findings: No rash.  Neurological:     Mental Status: He is alert and oriented to  person, place, and time.  Gait: Gait is intact.  Psychiatric:        Mood and Affect: Mood and affect normal.        Cognition and Memory: Memory normal.        Judgment: Judgment normal.      I spent 20 minutes dedicated to the care of this patient (face-to-face and non-face-to-face) on the date of the encounter to include what is described in the assessment and plan.,  Delon Hope, NP 02/05/2024 11:05 AM

## 2024-02-06 ENCOUNTER — Ambulatory Visit
Admission: EM | Admit: 2024-02-06 | Discharge: 2024-02-06 | Disposition: A | Discharge status: Home / Self Care | Attending: Nurse Practitioner | Admitting: Nurse Practitioner

## 2024-02-06 DIAGNOSIS — H5789 Other specified disorders of eye and adnexa: Secondary | ICD-10-CM | POA: Diagnosis not present

## 2024-02-06 MED ORDER — NEOMYCIN-POLYMYXIN-DEXAMETH 3.5-10000-0.1 OP SUSP: 1.0000 [drp] | OPHTHALMIC | 0 refills | Status: AC

## 2024-02-06 NOTE — ED Provider Notes (Signed)
 [facility]    CSN: [id number] Arrival date & time: 02/06/24  1733      History   Chief Complaint Chief Complaint  Patient presents with   Eye Pain    HPI Jack Gibbs is a 68 y.o. male.   The history is provided by the patient.   Patient presents for complaints of right eye redness, itching, and irritation.  Symptoms started 2 days ago, states he was out on the walking trail today and symptoms worsen.  Patient denies a feeling of a foreign object in the eye, light sensitivity, visual changes, fever, chills, or purulent drainage from the eye.  He states that the eye has been tearing constantly.  Patient states that he currently takes allergy medications daily.    Past Medical History:  Diagnosis Date   Anemia    Cancer (HCC)    Ascending colon s/p right hemicolectomy on 11/10/2017.    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   Right kidney mass 04/14/2023   Right renal mass 04/14/2023   History of kidney cancer 02/09/2020   History of colon cancer 06/09/2019   Iron deficiency anemia due to chronic blood loss 11/26/2017   Colon cancer (HCC) 10/28/2017   IDA (iron deficiency anemia) 09/11/2017   GERD (gastroesophageal reflux disease) 09/11/2017    Past Surgical History:  Procedure Laterality Date   BACK SURGERY     BIOPSY  10/17/2017   Procedure: BIOPSY;  Surgeon: Shaaron Lamar HERO, MD;  Location: [facility];  Service: Endoscopy;;   COLON RESECTION N/A 11/10/2017   Procedure: LAPAROSCOPIC PARTIAL COLECTOMY;  Surgeon: Kallie Manuelita BROCKS, MD;  Location: [facility];  Service: General;  Laterality: N/A;   COLONOSCOPY N/A 10/17/2017   Procedure: COLONOSCOPY;  Surgeon: Shaaron Lamar HERO, MD; bulky apple core tumor in the vicinity of a sending colon.   Diverticulosis in sigmoid and descending colon.  Pathology with invasive adenocarcinoma.   COLONOSCOPY N/A 07/28/2019   Procedure: COLONOSCOPY;  Surgeon: Shaaron Lamar HERO, MD;  Location: [facility];  Service: Endoscopy;  Laterality: N/A;  2:15pm   IR RADIOLOGIST EVAL & MGMT  01/08/2023   POLYPECTOMY  07/28/2019   Procedure: POLYPECTOMY;  Surgeon: Shaaron Lamar HERO, MD;  Location: [facility];  Service: Endoscopy;;   ROBOT ASSISTED LAPAROSCOPIC NEPHRECTOMY Right 04/14/2023   Procedure: XI ROBOTIC ASSISTED LAPAROSCOPIC NEPHRECTOMY- radical nephrectomy;  Surgeon: Sherrilee Belvie CROME, MD;  Location: [facility];  Service: Urology;  Laterality: Right;       Home Medications    Prior to Admission medications   Medication Sig Start Date End Date Taking? Authorizing Provider  atorvastatin  (LIPITOR) 20 MG tablet Take 20 mg by mouth daily. 01/15/22  Yes [provider]  diphenhydrAMINE  (BENADRYL ) 25 MG tablet Take 25 mg by mouth every 6 (six) hours as needed for allergies.   Yes [provider]  lisinopril  (ZESTRIL ) 5 MG tablet TAKE 1 TABLET BY MOUTH EVERY DAY FOR BLOOD PRESSURE 01/13/24  Yes [provider]    Family History Family History  Problem Relation Age of Onset   Stroke Mother    Alcohol abuse Father    Down syndrome Sister    Alcohol abuse Brother    Colon cancer Neg Hx  Ulcers Neg Hx     Social History Social History   Tobacco Use   Smoking status: Never   Smokeless tobacco: Former    Types: Chew    Quit date: 11/25/1978  Vaping Use   Vaping status: Never Used  Substance Use Topics   Alcohol use: No   Drug use: No     Allergies   Rosuvastatin   Review of Systems Review of Systems Per HPI  Physical Exam Triage Vital Signs ED Triage Vitals  Encounter Vitals Group     BP 02/06/24 1802 (!) 157/77     Girls Systolic BP Percentile --      Girls Diastolic BP Percentile --      Boys Systolic BP Percentile --      Boys Diastolic BP Percentile  --      Pulse Rate 02/06/24 1802 61     Resp 02/06/24 1802 18     Temp 02/06/24 1802 98 F (36.7 C)     Temp Source 02/06/24 1802 Oral     SpO2 02/06/24 1802 97 %     Weight --      Height --      Head Circumference --      Peak Flow --      Pain Score 02/06/24 1805 0     Pain Loc --      Pain Education --      Exclude from Growth Chart --    No data found.  Updated Vital Signs BP (!) 157/77 (BP Location: Left Arm)   Pulse 61   Temp 98 F (36.7 C) (Oral)   Resp 18   SpO2 97%   Visual Acuity Right Eye Distance:   Left Eye Distance:   Bilateral Distance:    Right Eye Near:   Left Eye Near:    Bilateral Near:     Physical Exam Vitals and nursing note reviewed.  Constitutional:      Appearance: Normal appearance.  HENT:     Head: Normocephalic.     Nose: Nose normal.     Mouth/Throat:     Mouth: Mucous membranes are moist.  Eyes:     General: Vision grossly intact. No visual field deficit.       Right eye: No foreign body, discharge or hordeolum.     Extraocular Movements: Extraocular movements intact.     Right eye: Normal extraocular motion and no nystagmus.     Conjunctiva/sclera:     Right eye: Right conjunctiva is injected.     Pupils: Pupils are equal, round, and reactive to light.     Comments: Moderate injection of the right eye with excessive tearing present.  There is no exudate, hemorrhage, or foreign body present.  Pulmonary:     Effort: Pulmonary effort is normal.  Musculoskeletal:     Cervical back: Normal range of motion.  Skin:    General: Skin is warm and dry.  Neurological:     General: No focal deficit present.     Mental Status: He is alert and oriented to person, place, and time.  Psychiatric:        Mood and Affect: Mood normal.        Behavior: Behavior normal.      UC Treatments / Results  Labs (all labs ordered are listed, but only abnormal results are displayed) Labs Reviewed - No data to display  EKG   Radiology No  results found.  Procedures Procedures (including critical care  time)  Medications Ordered in UC Medications - No data to display  Initial Impression / Assessment and Plan / UC Course  I have reviewed the triage vital signs and the nursing notes.  Pertinent labs & imaging results that were available during my care of the patient were reviewed by me and considered in my medical decision making (see chart for details).  Will treat patient eye irritation and possible infection with Maxitrol suspension.  Supportive care recommendations were provided and discussed with the patient to include continuing his current allergy medication, cool compresses to the eye, avoiding allergy triggers, and use of Visine or Clear Eyes to keep the eye moist and lubricated.  Discussed indications with patient regarding follow-up.  Patient was in agreement with this plan of care and verbalizes understanding.  All questions were answered.  Patient stable for discharge.    Final Clinical Impressions(s) / UC Diagnoses   Final diagnoses:  None   Discharge Instructions   None    ED Prescriptions   None    PDMP not reviewed this encounter.   Gilmer Etta PARAS, NP 02/06/24 [phone]

## 2024-02-06 NOTE — Discharge Instructions (Addendum)
 Use eyedrops as prescribed.   Cool compresses to the eyes to help with pain or swelling.  You may use warm compresses to help with pain or discomfort. Recommend using Visine or Clear Eyes eyedrops daily to keep the eyes moist and lubricated while symptoms persist. Strict handwashing when applying medication.  Avoid rubbing or manipulating the eyes while symptoms persist. Recommend continuing your current allergy regimen at this time. If symptoms fail to improve, recommend follow-up with your primary care physician or with your eye doctor for further evaluation. Follow-up as needed.

## 2024-02-06 NOTE — ED Triage Notes (Signed)
 Patient presents to the office for right eye pain and itching x 2 days. Denies any trauma to his eye.

## 2024-07-23 ENCOUNTER — Other Ambulatory Visit

## 2024-07-30 ENCOUNTER — Ambulatory Visit: Admitting: Urology

## 2024-08-05 ENCOUNTER — Inpatient Hospital Stay

## 2024-08-05 ENCOUNTER — Other Ambulatory Visit (HOSPITAL_COMMUNITY)

## 2024-08-12 ENCOUNTER — Ambulatory Visit: Admitting: Oncology

## 2024-08-12 ENCOUNTER — Inpatient Hospital Stay: Admitting: Oncology
